# Patient Record
Sex: Male | Born: 1945
Health system: Southern US, Community
[De-identification: ages and names within clinical notes are randomized; demographics above are authoritative.]

## PROBLEM LIST (undated history)

## (undated) DIAGNOSIS — J40 Bronchitis, not specified as acute or chronic: Secondary | ICD-10-CM

## (undated) DIAGNOSIS — R0789 Other chest pain: Secondary | ICD-10-CM

## (undated) DIAGNOSIS — K635 Polyp of colon: Secondary | ICD-10-CM

## (undated) DIAGNOSIS — R911 Solitary pulmonary nodule: Secondary | ICD-10-CM

## (undated) DIAGNOSIS — E119 Type 2 diabetes mellitus without complications: Secondary | ICD-10-CM

## (undated) DIAGNOSIS — M1990 Unspecified osteoarthritis, unspecified site: Secondary | ICD-10-CM

## (undated) DIAGNOSIS — R7401 Elevation of levels of liver transaminase levels: Secondary | ICD-10-CM

## (undated) DIAGNOSIS — H9190 Unspecified hearing loss, unspecified ear: Secondary | ICD-10-CM

## (undated) DIAGNOSIS — R0609 Other forms of dyspnea: Secondary | ICD-10-CM

## (undated) DIAGNOSIS — T464X5A Adverse effect of angiotensin-converting-enzyme inhibitors, initial encounter: Secondary | ICD-10-CM

## (undated) DIAGNOSIS — M2669 Other specified disorders of temporomandibular joint: Secondary | ICD-10-CM

## (undated) DIAGNOSIS — I779 Disorder of arteries and arterioles, unspecified: Secondary | ICD-10-CM

## (undated) DIAGNOSIS — R058 Other specified cough: Secondary | ICD-10-CM

## (undated) DIAGNOSIS — F1490 Cocaine use, unspecified, uncomplicated: Secondary | ICD-10-CM

## (undated) DIAGNOSIS — R740 Nonspecific elevation of levels of transaminase and lactic acid dehydrogenase [LDH]: Secondary | ICD-10-CM

## (undated) DIAGNOSIS — R05 Cough: Secondary | ICD-10-CM

## (undated) DIAGNOSIS — I10 Essential (primary) hypertension: Secondary | ICD-10-CM

## (undated) DIAGNOSIS — E7800 Pure hypercholesterolemia, unspecified: Secondary | ICD-10-CM

## (undated) HISTORY — DX: Unspecified osteoarthritis, unspecified site: M19.90

## (undated) HISTORY — DX: Type 2 diabetes mellitus without complications: E11.9

## (undated) HISTORY — DX: Other forms of dyspnea: R06.09

## (undated) HISTORY — DX: Adverse effect of angiotensin-converting-enzyme inhibitors, initial encounter: T46.4X5A

## (undated) HISTORY — DX: Other specified cough: R05.8

## (undated) HISTORY — DX: Bronchitis, not specified as acute or chronic: J40

## (undated) HISTORY — DX: Polyp of colon: K63.5

## (undated) HISTORY — DX: Other specified disorders of temporomandibular joint: M26.69

## (undated) HISTORY — DX: Solitary pulmonary nodule: R91.1

## (undated) HISTORY — DX: Unspecified hearing loss, unspecified ear: H91.90

## (undated) HISTORY — DX: Other chest pain: R07.89

## (undated) HISTORY — DX: Disorder of arteries and arterioles, unspecified: I77.9

## (undated) HISTORY — DX: Cocaine use, unspecified, uncomplicated: F14.90

## (undated) HISTORY — DX: Essential (primary) hypertension: I10

## (undated) HISTORY — DX: Elevation of levels of liver transaminase levels: R74.01

## (undated) HISTORY — DX: Cough: R05

## (undated) HISTORY — DX: Pure hypercholesterolemia, unspecified: E78.00

## (undated) HISTORY — DX: Nonspecific elevation of levels of transaminase and lactic acid dehydrogenase (ldh): R74.0

---

## 1998-12-04 ENCOUNTER — Emergency Department (HOSPITAL_COMMUNITY): Admission: EM | Admit: 1998-12-04 | Discharge: 1998-12-04 | Payer: Self-pay | Admitting: Emergency Medicine

## 1999-11-19 ENCOUNTER — Emergency Department (HOSPITAL_COMMUNITY): Admission: EM | Admit: 1999-11-19 | Discharge: 1999-11-19 | Payer: Self-pay | Admitting: Internal Medicine

## 2002-02-16 ENCOUNTER — Emergency Department (HOSPITAL_COMMUNITY): Admission: EM | Admit: 2002-02-16 | Discharge: 2002-02-16 | Payer: Self-pay | Admitting: *Deleted

## 2002-02-16 ENCOUNTER — Encounter: Payer: Self-pay | Admitting: *Deleted

## 2002-09-16 DIAGNOSIS — I779 Disorder of arteries and arterioles, unspecified: Secondary | ICD-10-CM

## 2002-09-16 HISTORY — DX: Disorder of arteries and arterioles, unspecified: I77.9

## 2002-09-30 ENCOUNTER — Emergency Department (HOSPITAL_COMMUNITY): Admission: EM | Admit: 2002-09-30 | Discharge: 2002-09-30 | Payer: Self-pay | Admitting: Emergency Medicine

## 2002-09-30 ENCOUNTER — Encounter: Payer: Self-pay | Admitting: Emergency Medicine

## 2003-07-10 ENCOUNTER — Emergency Department (HOSPITAL_COMMUNITY): Admission: EM | Admit: 2003-07-10 | Discharge: 2003-07-11 | Payer: Self-pay | Admitting: Emergency Medicine

## 2004-06-24 ENCOUNTER — Ambulatory Visit: Payer: Self-pay | Admitting: Internal Medicine

## 2004-06-28 ENCOUNTER — Ambulatory Visit: Payer: Self-pay | Admitting: Internal Medicine

## 2004-07-28 ENCOUNTER — Ambulatory Visit: Payer: Self-pay | Admitting: Internal Medicine

## 2004-08-11 ENCOUNTER — Ambulatory Visit: Payer: Self-pay | Admitting: Internal Medicine

## 2004-08-26 ENCOUNTER — Ambulatory Visit: Payer: Self-pay | Admitting: Internal Medicine

## 2004-09-08 ENCOUNTER — Ambulatory Visit: Payer: Self-pay | Admitting: Internal Medicine

## 2004-10-14 ENCOUNTER — Ambulatory Visit (HOSPITAL_COMMUNITY): Admission: RE | Admit: 2004-10-14 | Discharge: 2004-10-14 | Payer: Self-pay | Admitting: Internal Medicine

## 2004-10-18 ENCOUNTER — Emergency Department (HOSPITAL_COMMUNITY): Admission: EM | Admit: 2004-10-18 | Discharge: 2004-10-18 | Payer: Self-pay | Admitting: Emergency Medicine

## 2004-12-10 ENCOUNTER — Ambulatory Visit: Payer: Self-pay | Admitting: Internal Medicine

## 2005-01-10 ENCOUNTER — Ambulatory Visit: Payer: Self-pay | Admitting: Internal Medicine

## 2005-03-02 ENCOUNTER — Ambulatory Visit: Payer: Self-pay | Admitting: Internal Medicine

## 2005-04-13 ENCOUNTER — Emergency Department (HOSPITAL_COMMUNITY): Admission: EM | Admit: 2005-04-13 | Discharge: 2005-04-13 | Payer: Self-pay | Admitting: Family Medicine

## 2005-05-13 ENCOUNTER — Ambulatory Visit: Payer: Self-pay | Admitting: Internal Medicine

## 2005-05-26 ENCOUNTER — Ambulatory Visit: Payer: Self-pay | Admitting: Internal Medicine

## 2005-09-26 ENCOUNTER — Ambulatory Visit: Payer: Self-pay | Admitting: Internal Medicine

## 2005-10-17 HISTORY — PX: COLONOSCOPY: SHX174

## 2005-10-27 ENCOUNTER — Ambulatory Visit: Payer: Self-pay | Admitting: Hospitalist

## 2005-11-01 ENCOUNTER — Ambulatory Visit: Payer: Self-pay | Admitting: Gastroenterology

## 2005-11-23 ENCOUNTER — Encounter (INDEPENDENT_AMBULATORY_CARE_PROVIDER_SITE_OTHER): Payer: Self-pay | Admitting: Specialist

## 2005-11-23 ENCOUNTER — Ambulatory Visit: Payer: Self-pay | Admitting: Gastroenterology

## 2005-11-23 ENCOUNTER — Encounter: Payer: Self-pay | Admitting: Internal Medicine

## 2005-11-29 ENCOUNTER — Emergency Department (HOSPITAL_COMMUNITY): Admission: EM | Admit: 2005-11-29 | Discharge: 2005-11-29 | Payer: Self-pay | Admitting: Family Medicine

## 2005-12-04 LAB — HM COLONOSCOPY: HM Colonoscopy: REACTIVE

## 2005-12-08 ENCOUNTER — Ambulatory Visit: Payer: Self-pay | Admitting: Hospitalist

## 2005-12-22 ENCOUNTER — Ambulatory Visit: Payer: Self-pay | Admitting: Hospitalist

## 2006-01-16 ENCOUNTER — Ambulatory Visit: Payer: Self-pay | Admitting: Hospitalist

## 2006-04-10 ENCOUNTER — Emergency Department (HOSPITAL_COMMUNITY): Admission: EM | Admit: 2006-04-10 | Discharge: 2006-04-10 | Payer: Self-pay | Admitting: Emergency Medicine

## 2006-09-29 ENCOUNTER — Ambulatory Visit: Payer: Self-pay | Admitting: Internal Medicine

## 2006-10-27 DIAGNOSIS — M26609 Unspecified temporomandibular joint disorder, unspecified side: Secondary | ICD-10-CM | POA: Insufficient documentation

## 2006-10-27 DIAGNOSIS — E785 Hyperlipidemia, unspecified: Secondary | ICD-10-CM | POA: Insufficient documentation

## 2006-12-25 ENCOUNTER — Telehealth: Payer: Self-pay | Admitting: *Deleted

## 2007-01-01 ENCOUNTER — Telehealth: Payer: Self-pay | Admitting: *Deleted

## 2007-08-06 ENCOUNTER — Telehealth (INDEPENDENT_AMBULATORY_CARE_PROVIDER_SITE_OTHER): Payer: Self-pay | Admitting: Hospitalist

## 2007-08-22 ENCOUNTER — Ambulatory Visit: Payer: Self-pay | Admitting: Hospitalist

## 2007-08-22 LAB — CONVERTED CEMR LAB: Microalb, Ur: 0.27 mg/dL (ref 0.00–1.89)

## 2007-08-24 ENCOUNTER — Telehealth: Payer: Self-pay | Admitting: *Deleted

## 2007-08-29 ENCOUNTER — Ambulatory Visit: Payer: Self-pay | Admitting: Hospitalist

## 2007-08-29 LAB — CONVERTED CEMR LAB
ALT: 73 units/L — ABNORMAL HIGH (ref 0–53)
AST: 61 units/L — ABNORMAL HIGH (ref 0–37)
Albumin: 4.1 g/dL (ref 3.5–5.2)
Alkaline Phosphatase: 58 units/L (ref 39–117)
CO2: 23 meq/L (ref 19–32)
Chloride: 101 meq/L (ref 96–112)
LDL Cholesterol: 132 mg/dL — ABNORMAL HIGH (ref 0–99)
Potassium: 3.5 meq/L (ref 3.5–5.3)
Sodium: 139 meq/L (ref 135–145)
Total CHOL/HDL Ratio: 6.1
VLDL: 25 mg/dL (ref 0–40)

## 2007-09-03 ENCOUNTER — Telehealth: Payer: Self-pay | Admitting: *Deleted

## 2007-09-17 ENCOUNTER — Ambulatory Visit: Payer: Self-pay | Admitting: Hospitalist

## 2007-09-17 LAB — CONVERTED CEMR LAB: Blood Glucose, Fingerstick: 170

## 2007-12-20 ENCOUNTER — Telehealth: Payer: Self-pay | Admitting: *Deleted

## 2008-01-07 ENCOUNTER — Ambulatory Visit: Payer: Self-pay | Admitting: Hospitalist

## 2008-01-07 LAB — CONVERTED CEMR LAB
ALT: 76 units/L — ABNORMAL HIGH (ref 0–53)
AST: 88 units/L — ABNORMAL HIGH (ref 0–37)
Albumin: 4.3 g/dL (ref 3.5–5.2)
Alkaline Phosphatase: 50 units/L (ref 39–117)
BUN: 12 mg/dL (ref 6–23)
Blood Glucose, AC Bkfst: 171 mg/dL
Calcium: 9.5 mg/dL (ref 8.4–10.5)
Cholesterol: 204 mg/dL — ABNORMAL HIGH (ref 0–200)
Creatinine, Ser: 1.01 mg/dL (ref 0.40–1.50)
LDL Cholesterol: 145 mg/dL — ABNORMAL HIGH (ref 0–99)
Potassium: 3.5 meq/L (ref 3.5–5.3)
Sodium: 139 meq/L (ref 135–145)
Total CHOL/HDL Ratio: 5.8
Triglycerides: 119 mg/dL (ref <150)

## 2008-04-02 ENCOUNTER — Ambulatory Visit: Payer: Self-pay | Admitting: *Deleted

## 2008-04-02 LAB — CONVERTED CEMR LAB
Blood Glucose, Fingerstick: 247
Hgb A1c MFr Bld: 9.8 %

## 2008-07-02 ENCOUNTER — Telehealth (INDEPENDENT_AMBULATORY_CARE_PROVIDER_SITE_OTHER): Payer: Self-pay | Admitting: *Deleted

## 2008-07-02 ENCOUNTER — Ambulatory Visit: Payer: Self-pay | Admitting: Internal Medicine

## 2008-07-02 ENCOUNTER — Ambulatory Visit (HOSPITAL_COMMUNITY): Admission: RE | Admit: 2008-07-02 | Discharge: 2008-07-02 | Payer: Self-pay | Admitting: Internal Medicine

## 2008-07-02 ENCOUNTER — Encounter (INDEPENDENT_AMBULATORY_CARE_PROVIDER_SITE_OTHER): Payer: Self-pay | Admitting: Internal Medicine

## 2008-07-02 DIAGNOSIS — E1159 Type 2 diabetes mellitus with other circulatory complications: Secondary | ICD-10-CM | POA: Insufficient documentation

## 2008-07-02 DIAGNOSIS — I152 Hypertension secondary to endocrine disorders: Secondary | ICD-10-CM | POA: Insufficient documentation

## 2008-07-02 DIAGNOSIS — I10 Essential (primary) hypertension: Secondary | ICD-10-CM

## 2008-07-02 LAB — CONVERTED CEMR LAB
Alkaline Phosphatase: 35 units/L — ABNORMAL LOW (ref 39–117)
CO2: 22 meq/L (ref 19–32)
Calcium: 9.8 mg/dL (ref 8.4–10.5)
Chloride: 104 meq/L (ref 96–112)
Creatinine, Urine: 343.1 mg/dL
Glucose, Bld: 202 mg/dL — ABNORMAL HIGH (ref 70–99)
Glucose, Urine, Semiquant: NEGATIVE
HCT: 45.3 % (ref 39.0–52.0)
Hemoglobin: 15 g/dL (ref 13.0–17.0)
MCHC: 33.1 g/dL (ref 30.0–36.0)
Microalb Creat Ratio: 6.8 mg/g (ref 0.0–30.0)
Nitrite: NEGATIVE
Platelets: 282 10*3/uL (ref 150–400)
RBC: 5.18 M/uL (ref 4.22–5.81)
RDW: 14.5 % (ref 11.5–15.5)
Sodium: 142 meq/L (ref 135–145)
pH: 7

## 2008-07-03 ENCOUNTER — Encounter (INDEPENDENT_AMBULATORY_CARE_PROVIDER_SITE_OTHER): Payer: Self-pay | Admitting: Internal Medicine

## 2008-07-03 ENCOUNTER — Ambulatory Visit: Payer: Self-pay | Admitting: Internal Medicine

## 2008-07-03 LAB — CONVERTED CEMR LAB
LDL Cholesterol: 154 mg/dL — ABNORMAL HIGH (ref 0–99)
Total CHOL/HDL Ratio: 5.5

## 2008-07-04 ENCOUNTER — Ambulatory Visit (HOSPITAL_COMMUNITY): Admission: RE | Admit: 2008-07-04 | Discharge: 2008-07-04 | Payer: Self-pay | Admitting: Internal Medicine

## 2008-07-15 ENCOUNTER — Ambulatory Visit: Payer: Self-pay | Admitting: Internal Medicine

## 2008-08-07 ENCOUNTER — Telehealth (INDEPENDENT_AMBULATORY_CARE_PROVIDER_SITE_OTHER): Payer: Self-pay | Admitting: Internal Medicine

## 2008-08-13 ENCOUNTER — Telehealth (INDEPENDENT_AMBULATORY_CARE_PROVIDER_SITE_OTHER): Payer: Self-pay | Admitting: Internal Medicine

## 2008-08-14 ENCOUNTER — Telehealth: Payer: Self-pay | Admitting: Internal Medicine

## 2008-10-02 ENCOUNTER — Telehealth: Payer: Self-pay | Admitting: Internal Medicine

## 2009-02-18 ENCOUNTER — Telehealth: Payer: Self-pay | Admitting: Internal Medicine

## 2009-02-19 ENCOUNTER — Telehealth (INDEPENDENT_AMBULATORY_CARE_PROVIDER_SITE_OTHER): Payer: Self-pay | Admitting: *Deleted

## 2009-02-19 ENCOUNTER — Ambulatory Visit: Payer: Self-pay | Admitting: Internal Medicine

## 2009-02-19 ENCOUNTER — Encounter: Payer: Self-pay | Admitting: Internal Medicine

## 2009-02-19 LAB — HM DIABETES EYE EXAM

## 2009-02-19 LAB — CONVERTED CEMR LAB: Hgb A1c MFr Bld: 6.9 %

## 2009-04-12 ENCOUNTER — Encounter: Payer: Self-pay | Admitting: Internal Medicine

## 2009-04-12 ENCOUNTER — Ambulatory Visit: Payer: Self-pay | Admitting: Infectious Diseases

## 2009-04-12 ENCOUNTER — Observation Stay (HOSPITAL_COMMUNITY): Admission: EM | Admit: 2009-04-12 | Discharge: 2009-04-13 | Payer: Self-pay | Admitting: Emergency Medicine

## 2009-04-13 ENCOUNTER — Encounter (INDEPENDENT_AMBULATORY_CARE_PROVIDER_SITE_OTHER): Payer: Self-pay | Admitting: Internal Medicine

## 2009-04-28 ENCOUNTER — Encounter: Payer: Self-pay | Admitting: Internal Medicine

## 2009-04-28 ENCOUNTER — Ambulatory Visit: Payer: Self-pay | Admitting: Internal Medicine

## 2009-04-28 LAB — CONVERTED CEMR LAB
Alkaline Phosphatase: 35 units/L — ABNORMAL LOW (ref 39–117)
BUN: 9 mg/dL (ref 6–23)
CO2: 25 meq/L (ref 19–32)
Calcium: 9.9 mg/dL (ref 8.4–10.5)
Chloride: 103 meq/L (ref 96–112)
Creatinine, Urine: 120.3 mg/dL
Eosinophils Relative: 2 % (ref 0–5)
Glucose, Bld: 99 mg/dL (ref 70–99)
HCT: 44.5 % (ref 39.0–52.0)
Hemoglobin: 14.8 g/dL (ref 13.0–17.0)
Hgb A1c MFr Bld: 7.3 %
LDL Cholesterol: 84 mg/dL (ref 0–99)
Lymphocytes Relative: 39 % (ref 12–46)
MCHC: 33.3 g/dL (ref 30.0–36.0)
MCV: 86.2 fL (ref 78.0–100.0)
Microalb, Ur: 0.73 mg/dL (ref 0.00–1.89)
Neutro Abs: 3.8 10*3/uL (ref 1.7–7.7)
Neutrophils Relative %: 51 % (ref 43–77)
Potassium: 4 meq/L (ref 3.5–5.3)
RDW: 14.1 % (ref 11.5–15.5)
Sodium: 140 meq/L (ref 135–145)
Total Bilirubin: 0.5 mg/dL (ref 0.3–1.2)
Triglycerides: 118 mg/dL (ref <150)
VLDL: 24 mg/dL (ref 0–40)

## 2009-06-03 ENCOUNTER — Ambulatory Visit: Payer: Self-pay | Admitting: Internal Medicine

## 2009-06-03 LAB — CONVERTED CEMR LAB
BUN: 13 mg/dL (ref 6–23)
Blood Glucose, Fingerstick: 89
Calcium: 10.1 mg/dL (ref 8.4–10.5)
Chloride: 106 meq/L (ref 96–112)
Glucose, Bld: 83 mg/dL (ref 70–99)
Potassium: 3.7 meq/L (ref 3.5–5.3)

## 2009-06-04 ENCOUNTER — Telehealth: Payer: Self-pay | Admitting: Internal Medicine

## 2009-06-19 ENCOUNTER — Ambulatory Visit: Payer: Self-pay | Admitting: Infectious Diseases

## 2009-07-03 ENCOUNTER — Telehealth: Payer: Self-pay | Admitting: Internal Medicine

## 2009-07-06 ENCOUNTER — Ambulatory Visit (HOSPITAL_COMMUNITY): Admission: RE | Admit: 2009-07-06 | Discharge: 2009-07-06 | Payer: Self-pay | Admitting: Internal Medicine

## 2009-10-14 ENCOUNTER — Telehealth (INDEPENDENT_AMBULATORY_CARE_PROVIDER_SITE_OTHER): Payer: Self-pay | Admitting: *Deleted

## 2009-11-06 ENCOUNTER — Ambulatory Visit: Payer: Self-pay | Admitting: Internal Medicine

## 2009-11-06 LAB — CONVERTED CEMR LAB
Blood Glucose, Fingerstick: 264
Hgb A1c MFr Bld: 8.6 %

## 2009-12-10 ENCOUNTER — Ambulatory Visit: Payer: Self-pay | Admitting: Internal Medicine

## 2009-12-24 ENCOUNTER — Emergency Department (HOSPITAL_COMMUNITY): Admission: EM | Admit: 2009-12-24 | Discharge: 2009-12-24 | Payer: Self-pay | Admitting: Emergency Medicine

## 2010-01-02 ENCOUNTER — Emergency Department (HOSPITAL_COMMUNITY): Admission: EM | Admit: 2010-01-02 | Discharge: 2010-01-02 | Payer: Self-pay | Admitting: Emergency Medicine

## 2010-01-07 ENCOUNTER — Ambulatory Visit: Payer: Self-pay | Admitting: Internal Medicine

## 2010-02-16 ENCOUNTER — Ambulatory Visit: Payer: Self-pay | Admitting: Internal Medicine

## 2010-02-16 LAB — CONVERTED CEMR LAB: Blood Glucose, Fingerstick: 99

## 2010-07-07 ENCOUNTER — Ambulatory Visit: Payer: Self-pay | Admitting: Internal Medicine

## 2010-07-07 LAB — CONVERTED CEMR LAB: Blood Glucose, Fingerstick: 149

## 2010-09-01 ENCOUNTER — Ambulatory Visit: Payer: Self-pay | Admitting: Internal Medicine

## 2010-09-01 ENCOUNTER — Telehealth: Payer: Self-pay | Admitting: *Deleted

## 2010-09-01 DIAGNOSIS — N201 Calculus of ureter: Secondary | ICD-10-CM

## 2010-09-01 LAB — CONVERTED CEMR LAB
Blood Glucose, Fingerstick: 156
Hgb A1c MFr Bld: 9 %

## 2010-09-02 ENCOUNTER — Ambulatory Visit (HOSPITAL_COMMUNITY): Admission: RE | Admit: 2010-09-02 | Discharge: 2010-09-02 | Payer: Self-pay | Admitting: Internal Medicine

## 2010-09-02 LAB — CONVERTED CEMR LAB
ALT: 69 U/L — ABNORMAL HIGH
AST: 82 U/L — ABNORMAL HIGH
Albumin: 4.1 g/dL
Alkaline Phosphatase: 47 U/L
BUN: 10 mg/dL
Bilirubin, Direct: 0.2 mg/dL
CO2: 28 meq/L
Calcium: 9.6 mg/dL
Chloride: 99 meq/L
Cholesterol: 145 mg/dL
Creatinine, Ser: 1.11 mg/dL
Creatinine, Urine: 236 mg/dL
Glucose, Bld: 169 mg/dL — ABNORMAL HIGH
HDL: 32 mg/dL — ABNORMAL LOW
Hemoglobin, Urine: NEGATIVE
Indirect Bilirubin: 0.2 mg/dL
LDL Cholesterol: 74 mg/dL
Microalb Creat Ratio: 5.6 mg/g
Microalb, Ur: 1.31 mg/dL
Nitrite: NEGATIVE
Potassium: 3.4 meq/L — ABNORMAL LOW
Protein, ur: NEGATIVE mg/dL
Sodium: 137 meq/L
Specific Gravity, Urine: 1.025
Total Bilirubin: 0.4 mg/dL
Total CHOL/HDL Ratio: 4.5
Total Protein: 7.3 g/dL
Triglycerides: 193 mg/dL — ABNORMAL HIGH
Urine Glucose: NEGATIVE mg/dL
Urobilinogen, UA: 2 — ABNORMAL HIGH
VLDL: 39 mg/dL
pH: 6

## 2010-10-01 ENCOUNTER — Telehealth: Payer: Self-pay | Admitting: Internal Medicine
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
## 2010-11-16 NOTE — Assessment & Plan Note (Signed)
Summary: DM TRAINING/VS   Allergies: No Known Drug Allergies   Complete Medication List: 1)  Aspir-low 81 Mg Tbec (Aspirin) .... Take 1 tablet by mouth once a day 2)  Glipizide 10 Mg Tabs (Glipizide) .... Take 1 tablet by mouth two times a day 3)  Pravachol 40 Mg Tabs (Pravastatin sodium) .... Take 1 tablet by mouth once a day 4)  Ultram 50 Mg Tabs (Tramadol hcl) .... Take 1 tablet by mouth four times a day 5)  Amlodipine Besylate 10 Mg Tabs (Amlodipine besylate) .... Take 1 tablet by mouth once a day 6)  Hydrochlorothiazide 25 Mg Tabs (Hydrochlorothiazide) .... Take 1 tablet by mouth once a day  Other Orders: DSMT(Medicare) Individual, 30 Minutes (E9937)  Diabetes Self Management Training  PCP: Yolonda Kida MD Diabetes Type: Type 2 non-insulin Other persons present: no Current smoking Status: never  Assessment Work Hours: Not currently working Sources of Support: reports wife and fmaily as supports- wife does his blood sugar testing for him- he doesn;t like needles/pain/wants to avoid insulin  Potential Barriers  Economic/Supplies  Needs review/assistance  Diabetes Medications:  Comments: says he has a meter and daughter purchases his testing supplies and medications for him    Monitoring Self monitoring blood glucose - a few times a week Name of Meter  ? patient did notknow  Recent Episodes of: Requiring Help from another person  Hyperglycemia : Yes Hypoglycemia: No Severe Hypoglycemia : No     Diabetes Disease Process  Discussed today Define diabetes in simple terms: No knowledge   State diabetes is treated by meal plan-exercise-medication-monitoring-education: Needs review/assistance Medications State name-action-dose-duration-side effects-and time to take medication: No knowledge    Nutritional Management Identify what foods most often affect blood glucose: No knowledge    Verbalize importance of controlling food portions: Needs review/assistance     State importance of spacing and not omitting meals and snacks: No knowledge    Monitoring State purpose and frequency of monitoring BG-ketones-HgbA1C  : Needs review/assistance   Perform glucose monitoring/ketone testing and record results correctly: Needs review/assistance    State target blood glucose and HgbA1C goals: Needs review/assistance    Complications State the causes-signs and symptoms and prevention of Hyperglycemia: No knowledge   Explain proper treatment of hyperglycemia: No knowledge   State the causes- signs and symptoms and prevention of hypoglycemia: No knowledge   Explain proper treatment of hypoglycemia: No knowledge   Glucose control and the development/prevention of long-term complications: Needs review/assistance    Exercise States importance of exercise: Needs review/assistance    Lifestyle changes:Goal setting and Problem solving State benefits of making appropriate lifestyle changes: Needs review/assistanceIdentify lifestyle behaviors that need to change: Needs review/assistanceIdentify risk factors that interfere with health: Needs review/assistanceDevelop strategies to reduce risk factors: Needs review/assistanceIdentify Family/SO role in managing diabetes: Demonstrates competency Psychosocial Adjustment State three common feelings that might be experienced when learning to cope with diabetes: Needs review/assistanceIdentify two methods to cope with these feelings: Needs review/assistanceIdentify how stress affects diabetes & two sources of stress: Needs review/assistanceName two ways of obtaining support from family/friends: Needs review/assistanceDiabetes Management Education Done: 12/10/2009    BEHAVIORAL GOALS INITIAL   Specific goal set today: no goals set at this time nas patient didnot demonstrate readiness- he was not feelign well today      gave patient informaiotn about Living with diabetes booklet nad the road map to success in managing  diabetes. Invited wife to future visits. Diabetes Self Management Support: family, clinic staff Follow-up:per  patient -being self pay is barrier- could refer to [facility] free ADA program
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
## 2010-11-16 NOTE — Assessment & Plan Note (Signed)
Summary: DM TEACHING/DS   Vital Signs:  Patient profile:   65 year old male Weight:      257 pounds BMI:     37.01 CBG Result 149 Comments this blood sugar is 2-2.5 hours after eating pigs feet, watermelon and water for lunch   Allergies: No Known Drug Allergies   Complete Medication List: 1)  Aspir-low 81 Mg Tbec (Aspirin) .... Take 1 tablet by mouth once a day 2)  Glipizide 10 Mg Tabs (Glipizide) .... Take 1 tablet by mouth two times a day 3)  Pravachol 40 Mg Tabs (Pravastatin sodium) .... Take 1 tablet by mouth once a day 4)  Amlodipine Besylate 10 Mg Tabs (Amlodipine besylate) .... Take 1 tablet by mouth once a day 5)  Hydrochlorothiazide 25 Mg Tabs (Hydrochlorothiazide) .... Take 1 tablet by mouth once a day 6)  Metformin Hcl 500 Mg Tabs (Metformin hcl) .... Take 2 tablets by mouth two times a day  Other Orders: T-Hgb A1C (in-house) [phone]) DSMT(Medicare) Individual, 30 Minutes [phone])  Laboratory Results   Blood Tests   Date/Time Received: July 07, 2010 2:48 PM Date/Time Reported: Maryan Rued  July 07, 2010 2:48 PM   HGBA1C: 7.8%   (Normal Range: Non-Diabetic - 3-6%   Control Diabetic - 6-8%) CBG Random:: 154m/dL     Diabetes Self Management Training  PCP: VYolonda KidaMD Date diagnosed with diabetes: 10/17/2005 Diabetes Type: Type 2 non-insulin Current smoking Status: never  Vital Signs Todays Weight: 257lb  in BMI 37.01in-lbs      Diabetes Medications:  Comments: Does not self monitor because of the pain and he doesn;t like the sticking. His wifes checks sometimes for him- last self monitor was 1 month ago. He did not know what his A1C was last check, nor how that related to his self monitoring at home      Estimated /Usual Carb Intake Breakfast # of Carbs/Grams cereal, eggs, banana, milk, coffee Lunch # of Carbs/Grams pigs feet, watermelon and water  Activity Limitations  Inadequate physical activity  Type of physical  activity  His focus today was on starting to walk again. he stops periodically for pain in feet legs. He wants to try getting new walking shoes Diabetes Disease Process  Discussed today Define diabetes in simple terms: Needs review/assistance Medications  Nutritional Management Identify what foods most often affect blood glucose: Needs review/assistance    Verbalize importance of controlling food portions: Needs review/assistance   State importance of spacing and not omitting meals and snacks: Demonstrates competencyState changes planned for home meals/snacks: Needs review/assistance    Monitoring State purpose and frequency of monitoring BG-ketones-HgbA1C  : Needs review/assistance   Perform glucose monitoring/ketone testing and record results correctly: Needs review/assistance    State target blood glucose and HgbA1C goals: Needs review/assistance    Complications State the causes-signs and symptoms and prevention of Hyperglycemia: Needs review/assistanceExplain proper treatment of hyperglycemia: Needs review/assistance   State the causes- signs and symptoms and prevention of hypoglycemia: Demonstrates competency   Explain proper treatment of hypoglycemia: Needs review/assistance    Exercise States importance of exercise: Needs review/assistance   States effect of exercise on blood glucose: Needs review/assistance   Verbalizes safety measures for exercise related to diabetes: Needs review/assistance    Lifestyle changes:Goal setting and Problem solving State benefits of making appropriate lifestyle changes: Needs review/assistance   Identify lifestyle behaviors that need to change: Needs review/assistance   Identify risk factors that interfere with health: Needs review/assistance   Develop strategies  to reduce risk factors: Needs review/assistance   Verbalize need for and frequency of health care follow-up: Needs review/assistanceDiabetes Management Education Done:  07/07/2010    BEHAVIORAL GOALS INITIAL Incorporating physical activity into lifestyle: start welking again after purchasing shoes Incorporating appropriate nutritional management: Limit carb portions at meals        Diabetes Self Management Support: family, clinic staff Follow-up: by phone in 3 weeks, TBD after that depending on patient needs

## 2010-11-16 NOTE — Progress Notes (Signed)
Summary: phone/gg  Phone Note Call from Patient   Caller: Patient Summary of Call: Pt called with pain to low back pain with radiation to leg.  Onset 6 days ago.  No know cause. Taking tylenol without relief.  will see today at 1500 Initial call taken by: Gevena Cotton RN,  September 01, 2010 10:26 AM
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
## 2010-11-18 NOTE — Progress Notes (Signed)
Summary: med refill/gp  Phone Note Refill Request Message from:  Fax from Pharmacy on October 01, 2010 11:26 AM  Refills Requested: Medication #1:  PRAVACHOL 40 MG TABS Take 1 tablet by mouth once a day. Hold this until you see your doctor   Last Refilled: 05/10/2010  Method Requested: Electronic Initial call taken by: Morrison Old RN,  October 01, 2010 11:26 AM  Follow-up for Phone Call       Follow-up by: Yolonda Kida MD,  October 01, 2010 1:55 PM    Prescriptions: PRAVACHOL 40 MG TABS (PRAVASTATIN SODIUM) Take 1 tablet by mouth once a day. Hold this until you see your doctor  #30 x 11   Entered and Authorized by:   Yolonda Kida MD   Signed by:   Yolonda Kida MD on 10/01/2010   Method used:   Electronically to        [facility] [phone]* (retail)       [address]       [place], [place]  [address]       Ph: [phone]       Fax: [phone]   RxID:   [id number]

## 2010-12-28 LAB — GLUCOSE, CAPILLARY: Glucose-Capillary: 156 mg/dL — ABNORMAL HIGH (ref 70–99)

## 2011-01-02 LAB — GLUCOSE, CAPILLARY: Glucose-Capillary: 264 mg/dL — ABNORMAL HIGH (ref 70–99)

## 2011-01-10 LAB — URINALYSIS, ROUTINE W REFLEX MICROSCOPIC
Glucose, UA: 250 mg/dL — AB
Ketones, ur: NEGATIVE mg/dL
Protein, ur: NEGATIVE mg/dL

## 2011-01-10 LAB — COMPREHENSIVE METABOLIC PANEL
ALT: 33 U/L (ref 0–53)
AST: 31 U/L (ref 0–37)
Albumin: 4.1 g/dL (ref 3.5–5.2)
Alkaline Phosphatase: 50 U/L (ref 39–117)
BUN: 17 mg/dL (ref 6–23)
Chloride: 102 mEq/L (ref 96–112)
GFR calc Af Amer: 45 mL/min — ABNORMAL LOW (ref >60)
Potassium: 3.4 mEq/L — ABNORMAL LOW (ref 3.5–5.1)
Sodium: 140 mEq/L (ref 135–145)
Total Bilirubin: 0.6 mg/dL (ref 0.3–1.2)
Total Protein: 7.8 g/dL (ref 6.0–8.3)

## 2011-01-10 LAB — DIFFERENTIAL
Basophils Absolute: 0 10*3/uL (ref 0.0–0.1)
Basophils Relative: 0 % (ref 0–1)
Eosinophils Absolute: 0.2 10*3/uL (ref 0.0–0.7)
Eosinophils Relative: 2 % (ref 0–5)
Monocytes Absolute: 1 10*3/uL (ref 0.1–1.0)
Monocytes Relative: 9 % (ref 3–12)

## 2011-01-10 LAB — CBC
HCT: 43.5 % (ref 39.0–52.0)
Platelets: 232 10*3/uL (ref 150–400)
RDW: 14.3 % (ref 11.5–15.5)
WBC: 11.4 10*3/uL — ABNORMAL HIGH (ref 4.0–10.5)

## 2011-01-10 LAB — URINE MICROSCOPIC-ADD ON

## 2011-01-22 LAB — GLUCOSE, CAPILLARY: Glucose-Capillary: 89 mg/dL (ref 70–99)

## 2011-01-24 LAB — GLUCOSE, CAPILLARY
Glucose-Capillary: 136 mg/dL — ABNORMAL HIGH (ref 70–99)
Glucose-Capillary: 157 mg/dL — ABNORMAL HIGH (ref 70–99)
Glucose-Capillary: 210 mg/dL — ABNORMAL HIGH (ref 70–99)

## 2011-01-24 LAB — BASIC METABOLIC PANEL
BUN: 9 mg/dL (ref 6–23)
Chloride: 105 mEq/L (ref 96–112)
GFR calc non Af Amer: >60 mL/min (ref >60)
Glucose, Bld: 86 mg/dL (ref 70–99)
Potassium: 3.5 mEq/L (ref 3.5–5.1)
Sodium: 139 mEq/L (ref 135–145)

## 2011-01-24 LAB — DIFFERENTIAL
Eosinophils Absolute: 0.3 10*3/uL (ref 0.0–0.7)
Eosinophils Relative: 3 % (ref 0–5)
Lymphocytes Relative: 37 % (ref 12–46)
Lymphs Abs: 3.3 10*3/uL (ref 0.7–4.0)
Monocytes Absolute: 0.7 10*3/uL (ref 0.1–1.0)

## 2011-01-24 LAB — CBC
HCT: 44.7 % (ref 39.0–52.0)
Hemoglobin: 14.7 g/dL (ref 13.0–17.0)
MCV: 88.9 fL (ref 78.0–100.0)
Platelets: 232 10*3/uL (ref 150–400)
RDW: 14.3 % (ref 11.5–15.5)

## 2011-01-24 LAB — URINE MICROSCOPIC-ADD ON

## 2011-01-24 LAB — POCT CARDIAC MARKERS: Troponin i, poc: <0.05 ng/mL (ref 0.00–0.09)

## 2011-01-24 LAB — CARDIAC PANEL(CRET KIN+CKTOT+MB+TROPI): Total CK: 250 U/L — ABNORMAL HIGH (ref 7–232)

## 2011-01-24 LAB — LIPID PANEL
Cholesterol: 149 mg/dL (ref 0–200)
HDL: 35 mg/dL — ABNORMAL LOW (ref >39)
LDL Cholesterol: 96 mg/dL (ref 0–99)
Triglycerides: 90 mg/dL (ref <150)

## 2011-01-24 LAB — URINALYSIS, ROUTINE W REFLEX MICROSCOPIC
Glucose, UA: NEGATIVE mg/dL
Nitrite: NEGATIVE
Specific Gravity, Urine: 1.017 (ref 1.005–1.030)
pH: 7.5 (ref 5.0–8.0)

## 2011-01-25 LAB — GLUCOSE, CAPILLARY: Glucose-Capillary: 182 mg/dL — ABNORMAL HIGH (ref 70–99)

## 2011-02-02 ENCOUNTER — Encounter: Payer: Self-pay | Admitting: Ophthalmology
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
## 2011-04-27 ENCOUNTER — Other Ambulatory Visit: Payer: Self-pay | Admitting: *Deleted

## 2011-04-27 MED ORDER — METFORMIN HCL 500 MG PO TABS: 1000.0000 mg | ORAL_TABLET | Freq: Two times a day (BID) | ORAL | Status: DC

## 2011-04-27 NOTE — Telephone Encounter (Signed)
Needs f/u appointment-has not been seen in almost a year. Will give one month refill only.

## 2011-04-27 NOTE — Telephone Encounter (Signed)
Message sent to front desk for an appt. 

## 2011-05-17 ENCOUNTER — Encounter: Payer: Self-pay | Admitting: Internal Medicine

## 2011-05-17 ENCOUNTER — Ambulatory Visit (INDEPENDENT_AMBULATORY_CARE_PROVIDER_SITE_OTHER): Payer: Self-pay | Admitting: Internal Medicine

## 2011-05-17 VITALS — BP 150/107 | HR 88 | Temp 97.5°F | Ht 70.0 in | Wt 244.4 lb

## 2011-05-17 DIAGNOSIS — E785 Hyperlipidemia, unspecified: Secondary | ICD-10-CM

## 2011-05-17 DIAGNOSIS — K635 Polyp of colon: Secondary | ICD-10-CM

## 2011-05-17 DIAGNOSIS — I10 Essential (primary) hypertension: Secondary | ICD-10-CM

## 2011-05-17 DIAGNOSIS — D126 Benign neoplasm of colon, unspecified: Secondary | ICD-10-CM

## 2011-05-17 DIAGNOSIS — E119 Type 2 diabetes mellitus without complications: Secondary | ICD-10-CM

## 2011-05-17 MED ORDER — INSULIN NPH ISOPHANE & REGULAR (70-30) 100 UNIT/ML ~~LOC~~ SUSP: SUBCUTANEOUS | Status: DC

## 2011-05-17 MED ORDER — RELION LANCETS MISC: Status: DC

## 2011-05-17 MED ORDER — AMLODIPINE BESYLATE 10 MG PO TABS: 10.0000 mg | ORAL_TABLET | Freq: Every day | ORAL | Status: DC

## 2011-05-17 MED ORDER — GLUCOSE BLOOD VI STRP: ORAL_STRIP | Status: DC

## 2011-05-17 MED ORDER — PRAVASTATIN SODIUM 40 MG PO TABS: 40.0000 mg | ORAL_TABLET | Freq: Every day | ORAL | Status: DC

## 2011-05-17 MED ORDER — ATENOLOL-CHLORTHALIDONE 100-25 MG PO TABS: ORAL_TABLET | ORAL | Status: DC

## 2011-05-17 MED ORDER — METFORMIN HCL 500 MG PO TABS: 1000.0000 mg | ORAL_TABLET | Freq: Two times a day (BID) | ORAL | Status: DC

## 2011-05-17 MED ORDER — "INSULIN SYRINGE-NEEDLE U-100 31G X 5/16"" 0.3 ML MISC": 1.0000 | Freq: Three times a day (TID) | Status: DC

## 2011-05-17 MED ORDER — BLOOD GLUCOSE METER KIT: PACK | Status: DC

## 2011-05-17 NOTE — Progress Notes (Signed)
History of present illness: [name] is a 65 year old man with past medical history hypertension, diabetes type 2, hyperlipidemia presents today for medication refill. Patient was last seen in November of 2011 and had a hemoglobin A1c of 9.0. Today he reports feeling well and has no complaints. He denies any numbness or tingling, blurred vision, polydipsia or polyuria , ulcers, nausea or vomiting, constipation or abdominal pain. He states that he will have an appointment with his ophthalmologist next month and also he knee a followup colonoscopy every 10 years because they found polyps on colonoscopy in 2007 which shows hyperplastic polyp with underlying reactive lymphoid aggregate, which was performed by Dr. Ardis Hughs.  He reports medication compliance.  Review of systems: As per history of present illness  Physical examination: General: alert, well-developed, and cooperative to examination.   Lungs: normal respiratory effort, no accessory muscle use, normal breath sounds, no crackles, and no wheezes. Heart: normal rate, regular rhythm, no murmur, no gallop, and no rub.  Abdomen: soft, non-tender, normal bowel sounds, no distention, no guarding, no rebound tenderness Msk: no joint swelling, no joint warmth, and no redness over joints.  Pulses: 2+ DP/PT pulses bilaterally Extremities: No cyanosis, clubbing, edema, tiny callus on right great toe on medial aspect without any erythema, tenderness or drainage.  Neurologic: alert & oriented X3, cranial nerves II-XII intact, strength normal in all extremities, sensation intact to light touch, and gait normal.  Psych: Oriented X3, memory intact for recent and remote, normally interactive, good eye contact, not anxious appearing, and not depressed appearing.

## 2011-05-17 NOTE — Assessment & Plan Note (Addendum)
Poorly controlled. His hemoglobin A1c has continued to trend up because his hemoglobin A1c was 9.0 in November 2011, repeat hemoglobin A1c today was 11.3. Even though he does report medication compliance, I think the oral medication is not effective enough to control his diabetes. As such patient was very hesitant to start insulin because he does not want to give himself shots however after a detailed discussion about the importance of controlling his hemoglobin A1c with a goal between 6 and 7 so that we can prevent future complications/ sequela of diabetes. I also explained to patient that with his hemoglobin A1c of 11.3, it is necessary that he start insulin for better control.  Patient voiced understanding and he states that he will try for a short period of time since he does not want to be on insulin for the rest of his life. I also explained to patient that once his hemoglobin A1c is within target goal, his PCP may titrate him off of insulin and continue just on oral medication.  Patient does not have insurance or orange car therefore he will be paying cash for his medication. -Will start Novolin 70/30, based on his weight of 110 kg and 0.5 unit per kg, he will need a total of 55 units. Patient was instructed to take 20 units in the morning 30 minutes before breakfast and then 35 units in the evening 30 minutes before dinner. -I will continue metformin 1000 mg by mouth twice a day -Will stop glipizide -I prescribed Relion glucometer, lancets, test strips and syringes today. -Will check a CMP today -Will check a urine microalbumin/creatinine ratio (even though his ratio was 5.6 a month ago) since his hemoglobin A1c is elevated to see if he needs to be on an ARB since patient cannot tolerate ACE inhibitor because it gives him a cough -Will repeat hemoglobin A1c in 3 months - I will have patient followup in 2 weeks with his PCP to evaluate his CBGs and adjust insulin dosage accordingly. - I asked patient  to check his blood sugars at least 2-3 times daily

## 2011-05-17 NOTE — Assessment & Plan Note (Signed)
Well controlled. Lipid panel in November 2011 showing LDL of 74. Will continue pravastatin 40 mg by mouth each bedtime.  I will not check a lipid panel today.

## 2011-05-17 NOTE — Assessment & Plan Note (Addendum)
Not well controlled. Blood pressure today was 144/103 which is not at target for a diabetic (<130/80).  Repeat blood pressure was 150/107.  Patient is unable to tolerate ACE inhibitor because of a cough.   -Will d/c HCTZ -Start atenolol/chlorthalidone 100/68m , take 1/2 tablet daily ([facility] only has Tenoretic 100/259mon their $4 list) -Continue Norvasc 1031mo qd -Recheck BP in 2 weeks

## 2011-05-17 NOTE — Patient Instructions (Signed)
Please inject 20 units in the morning and 35 units at night (30 minutes before meals) Continue taking Metformin 1056m twice daily Start taking Tenoretic (atenolol/chlorthalidone) 100/251m1/2 tablet daily Continue taking Norvasc (amlodipine 1052mone tablet daily Continue taking Pravastatin 65m57me tablet daily  Stop taking Glipizide Stop taking hydrochlorothiazide  Check your sugar 2-3 times daily and bring your meter to next office visit Continue to eat properly and exercise!! Follow up in 2 weeks with Dr. GargCathren Laine blood pressure check and evaluation of your blood sugars

## 2011-05-18 LAB — COMPLETE METABOLIC PANEL WITH GFR
AST: 71 U/L — ABNORMAL HIGH (ref 0–37)
Albumin: 4.5 g/dL (ref 3.5–5.2)
Alkaline Phosphatase: 53 U/L (ref 39–117)
Glucose, Bld: 309 mg/dL — ABNORMAL HIGH (ref 70–99)
Potassium: 3.7 mEq/L (ref 3.5–5.3)
Sodium: 136 mEq/L (ref 135–145)
Total Protein: 7.9 g/dL (ref 6.0–8.3)

## 2011-06-01 ENCOUNTER — Ambulatory Visit (INDEPENDENT_AMBULATORY_CARE_PROVIDER_SITE_OTHER): Payer: Self-pay | Admitting: Internal Medicine

## 2011-06-01 ENCOUNTER — Encounter: Payer: Self-pay | Admitting: Internal Medicine

## 2011-06-01 DIAGNOSIS — E119 Type 2 diabetes mellitus without complications: Secondary | ICD-10-CM

## 2011-06-01 DIAGNOSIS — E785 Hyperlipidemia, unspecified: Secondary | ICD-10-CM

## 2011-06-01 DIAGNOSIS — I10 Essential (primary) hypertension: Secondary | ICD-10-CM

## 2011-06-01 LAB — LIPID PANEL
HDL: 31 mg/dL — ABNORMAL LOW (ref >39)
LDL Cholesterol: 130 mg/dL — ABNORMAL HIGH (ref 0–99)

## 2011-06-01 LAB — HEPATIC FUNCTION PANEL
ALT: 44 U/L (ref 0–53)
AST: 55 U/L — ABNORMAL HIGH (ref 0–37)
Albumin: 4.2 g/dL (ref 3.5–5.2)
Alkaline Phosphatase: 38 U/L — ABNORMAL LOW (ref 39–117)
Bilirubin, Direct: 0.1 mg/dL (ref 0.0–0.3)
Indirect Bilirubin: 0.5 mg/dL (ref 0.0–0.9)
Total Bilirubin: 0.6 mg/dL (ref 0.3–1.2)
Total Protein: 7 g/dL (ref 6.0–8.3)

## 2011-06-01 NOTE — Assessment & Plan Note (Signed)
We will check fasting lipid panel today and we'll readjust medication regimen if indicated. We will also check liver function tests.

## 2011-06-01 NOTE — Assessment & Plan Note (Signed)
Uncontrolled diabetes. Patient is started on insulin. He is tolerating treatment well. Also reports compliance with medications and recommended diet and exercise. He brought in meter which we will download and review. For now we'll continue same regimen and will make no changes. Patient scheduled to come back in 3 months for further evaluation on diabetes control.

## 2011-06-01 NOTE — Assessment & Plan Note (Signed)
Blood pressure well controlled current medication regimen. We'll check liver function test today, electrolyte panel was checked recently and was within normal limits. We'll continue same medication regimen with no changes. Patient was advised to check blood pressure regularly and to call as directed the numbers are higher than 140/90.

## 2011-06-01 NOTE — Progress Notes (Signed)
  Subjective:    Patient ID: John Hobbs, male    DOB: 29-Aug-1946, 65 y.o.   MRN: [id number]  HPI  Patient is a 65 year old male with past medical history outlined below who presents to clinic for regular followup of diabetes, hypertension, cholesterol. He has been seen in clinic 2 weeks ago and A1c was checked at the time of the appointment and was over 11. Patient was started on insulin at that time. He brought in his meter for review and further evaluation. She reports compliance with current medications as well as recommended diet and exercise. He denies recent significant hospitalizations or sicknesses, no episodes of chest pain or shortness of breath, no abdominal or urinary concerns. Patient reports walking daily hold for one to 2 miles and avoiding fast food.  Review of Systems Constitutional: Denies fever, chills, diaphoresis, appetite change and fatigue.  HEENT: Denies photophobia, eye pain, redness, hearing loss, ear pain, congestion, sore throat, rhinorrhea, sneezing, mouth sores, trouble swallowing, neck pain, neck stiffness and tinnitus.   Respiratory: Denies SOB, DOE, cough, chest tightness,  and wheezing.   Cardiovascular: Denies chest pain, palpitations and leg swelling.  Gastrointestinal: Denies nausea, vomiting, abdominal pain, diarrhea, constipation, blood in stool and abdominal distention.  Genitourinary: Denies dysuria, urgency, frequency, hematuria, flank pain and difficulty urinating.  Musculoskeletal: Denies myalgias, back pain, joint swelling, arthralgias and gait problem.  Skin: Denies pallor, rash and wound.  Neurological: Denies dizziness, seizures, syncope, weakness, light-headedness, numbness and headaches.  Hematological: Denies adenopathy. Easy bruising, personal or family bleeding history  Psychiatric/Behavioral: Denies suicidal ideation, mood changes, confusion, nervousness, sleep disturbance and agitation      Objective:   Physical Exam Constitutional:  Vital signs reviewed.  Patient is a well-developed and well-nourished in no acute distress and cooperative with exam. Alert and oriented x3.  Cardiovascular: RRR, S1 normal, S2 normal, no MRG, pulses symmetric and intact bilaterally Pulmonary/Chest: CTAB, no wheezes, rales, or rhonchi Abdominal: Soft. Non-tender, non-distended, bowel sounds are normal, no masses, organomegaly, or guarding present.  Neurological: A&O x3, Strenght is normal and symmetric bilaterally, cranial nerve II-XII are grossly intact, no focal motor deficit, sensory intact to light touch bilaterally.  Skin: Warm, dry and intact. No rash, cyanosis, or clubbing.  Psychiatric: Normal mood and affect. speech and behavior is normal. Judgment and thought content normal. Cognition and memory are normal.          Assessment & Plan:

## 2011-08-29 ENCOUNTER — Ambulatory Visit (INDEPENDENT_AMBULATORY_CARE_PROVIDER_SITE_OTHER): Payer: Self-pay | Admitting: Internal Medicine

## 2011-08-29 VITALS — BP 146/95 | HR 71 | Temp 97.4°F | Wt 237.7 lb

## 2011-08-29 DIAGNOSIS — Z23 Encounter for immunization: Secondary | ICD-10-CM

## 2011-08-29 DIAGNOSIS — E785 Hyperlipidemia, unspecified: Secondary | ICD-10-CM

## 2011-08-29 DIAGNOSIS — I10 Essential (primary) hypertension: Secondary | ICD-10-CM

## 2011-08-29 DIAGNOSIS — E119 Type 2 diabetes mellitus without complications: Secondary | ICD-10-CM

## 2011-08-29 LAB — POCT GLYCOSYLATED HEMOGLOBIN (HGB A1C): Hemoglobin A1C: 7.4

## 2011-08-29 MED ORDER — ATENOLOL-CHLORTHALIDONE 100-25 MG PO TABS: ORAL_TABLET | ORAL | Status: DC

## 2011-08-29 MED ORDER — PRAVASTATIN SODIUM 20 MG PO TABS: 20.0000 mg | ORAL_TABLET | Freq: Every evening | ORAL | Status: DC

## 2011-08-29 MED ORDER — METFORMIN HCL 1000 MG PO TABS: 1000.0000 mg | ORAL_TABLET | Freq: Two times a day (BID) | ORAL | Status: DC

## 2011-08-29 NOTE — Assessment & Plan Note (Signed)
I would like to manage it without adding any BP meds at this time. Patient is motivated to loose weight and does not want another med to be added to his regimen. Follow up in 3 months.

## 2011-08-29 NOTE — Progress Notes (Signed)
  Subjective:    Patient ID: John Hobbs, male    DOB: [birth year], 65 y.o.   MRN: [id number]  HPI  John Hobbs is a 65 year old man with PMH as mentioned in the chart.  He is here today for a regular follow up.  His Hba1c is 7.4 today which is much improved as compared to last time (11.3). Patient was started on insulin the last time. He has been very compliant with all his meds. He does not complain about episodes of hypoglycemia.  He is asking me about ways to get rid of insulin.  His BP is 146/95 which is mildly elevated.   Patient is determined to loose weight and wants to get his BP and diabetes under better control.  Due for pneumovax and flushot.    Review of Systems  Constitutional: Negative for fever, activity change and appetite change.  HENT: Negative for sore throat.   Respiratory: Negative for cough and shortness of breath.   Cardiovascular: Negative for chest pain and leg swelling.  Gastrointestinal: Negative for nausea, abdominal pain, diarrhea, constipation and abdominal distention.  Genitourinary: Negative for frequency, hematuria and difficulty urinating.  Neurological: Negative for dizziness and headaches.  Psychiatric/Behavioral: Negative for suicidal ideas and behavioral problems.       Objective:   Physical Exam  Constitutional: He is oriented to person, place, and time. He appears well-developed and well-nourished.  HENT:  Head: Normocephalic and atraumatic.  Eyes: Conjunctivae and EOM are normal. Pupils are equal, round, and reactive to light. No scleral icterus.  Neck: Normal range of motion. Neck supple. No JVD present. No thyromegaly present.  Cardiovascular: Normal rate, regular rhythm, normal heart sounds and intact distal pulses.  Exam reveals no gallop and no friction rub.   No murmur heard. Pulmonary/Chest: Effort normal and breath sounds normal. No respiratory distress. He has no wheezes. He has no rales.  Abdominal: Soft. Bowel sounds are  normal. He exhibits no distension and no mass. There is no tenderness. There is no rebound and no guarding.  Musculoskeletal: Normal range of motion. He exhibits no edema and no tenderness.  Lymphadenopathy:    He has no cervical adenopathy.  Neurological: He is alert and oriented to person, place, and time.  Psychiatric: He has a normal mood and affect. His behavior is normal.          Assessment & Plan:

## 2011-08-29 NOTE — Assessment & Plan Note (Signed)
I would change the dose of insulin to 20 AM and 25 PM. Eye exam next week. No other changes today. 1800 calorie diet suggested.

## 2011-08-29 NOTE — Assessment & Plan Note (Signed)
Patient had not been on Pravachol as his LFT's were elevated the last office visit.  The recommendation to stop statin with elevated LFT's is when they rise >twice the upper limit of normal. I will restart pravachol at this time at a lower dose and will follow up LFT's in 3 months.

## 2011-08-29 NOTE — Patient Instructions (Signed)
1800 Calorie Diabetic Diet The 1800 calorie diabetic diet is designed for eating up to 1800 calories each day. Following this diet and making healthy meal choices can help improve overall health. It controls blood glucose (sugar) levels, and it can also help lower blood pressure and cholesterol. SERVING SIZES Measuring foods and serving sizes helps to make sure you are getting the right amount of food. The list below tells how big or small some common serving sizes are:  1 oz.........4 stacked dice.   3 oz........Marland KitchenDeck of cards.   1 tsp.......Marland KitchenTip of little finger.   1 tbs......Marland KitchenMarland KitchenThumb.   2 tbs.......Marland KitchenGolf ball.    cup......Marland KitchenHalf of a fist.   1 cup.......Marland KitchenA fist.  GUIDELINES FOR CHOOSING FOODS The goal of this diet is to eat a variety of foods and limit calories to 1800 each day. This can be done by choosing foods that are low in calories and fat. The diet also suggests eating small amounts of food frequently. Doing this helps control your blood glucose levels so they do not get too high or too low. Each meal or snack may include a protein food source to help you feel more satisfied. Try to eat about the same amount of food around the same time each day. This includes weekend days, travel days, and days off work. Space your meals about 4 to 5 hours apart, and add a snack between them, if you wish.  For example, a daily food plan could include breakfast, a morning snack, lunch, dinner, and an evening snack. Healthy meals and snacks have different types of foods, including whole grains, vegetables, fruits, lean meats, poultry, fish, and dairy products. As you plan your meals, select a variety of foods. Choose from the bread and starch, vegetable, fruit, dairy, and meat/protein groups. Examples of foods from each group are listed below with their suggested serving sizes. Use measuring cups and spoons to become familiar with what a healthy portion looks like. Bread and Starch Each serving equals  15 grams of carbohydrates.  1 slice bread.    bagel.    cup cold cereal (unsweetened).    cup hot cereal or mashed potatoes.   1 small potato (size of a computer mouse).   ? cup cooked pasta or rice.    English muffin.   1 cup broth-based soup.   3 cups of popcorn.   4 to 6 whole-wheat crackers.    cup cooked beans, peas, or corn.  Vegetables Each serving equals 5 grams of carbohydrates.   cup cooked vegetables.   1 cup raw vegetables.    cup tomato or vegetable juice.  Fruit Each serving equals 15 grams of carbohydrates.  1 small apple or orange.   1  cup watermelon or strawberries.    cup applesauce (no sugar added).   2 tbs raisins.    banana.    cup canned fruit, packed in water or in its own juice.    cup unsweetened fruit juice.  Dairy Each serving equals 12 to 15 grams of carbohydrates.  1 cup fat-free milk.   6 oz artificially sweetened yogurt or plain yogurt.   1 cup low-fat buttermilk.   1 cup soy milk.   1 cup almond milk.  Meat/Protein  1 large egg.   2 to 3 oz meat, poultry, or fish.    cup low-fat cottage cheese.   1 tbs peanut butter.   1 oz low-fat cheese.    cup tuna, packed in water.  cup tofu.  Fat  1 tsp oil.   1 tsp trans-fat-free margarine.   1 tsp butter.   1 tsp mayonnaise.   2 tbs avocado.   1 tbs salad dressing.   1 tbs cream cheese.   2 tbs sour cream.  SAMPLE 1800 CALORIE DIET PLAN Breakfast   cup unsweetened cereal (1 carb serving).   1 cup fat-free milk (1 carb serving).   1 slice whole-wheat toast (1 carb serving).    small banana (1 carb serving).   1 scrambled egg.   1 tsp trans-fat-free margarine.  Lunch  Tuna sandwich.   2 slices whole-wheat bread (2 carb servings).    cup canned tuna in water, drained.   1 tbs reduced fat mayonnaise.   1 stalk celery, chopped.   2 slices tomato.   1 lettuce leaf.   1 cup carrot sticks.   24 to 30 seedless  grapes (2 carb servings).   6 oz light yogurt (1 carb serving).  Afternoon Snack  3 graham cracker squares (1 carb serving).   1 cup fat-free milk (1 carb serving).   1 tbs peanut butter.  Dinner  3 oz salmon, broiled with 1 tsp oil.   1 cup mashed potatoes (2 carb servings) with 1 tsp trans-fat-free margarine.   1 cup fresh or frozen green beans.   1 cup steamed asparagus.   1 cup fat-free milk (1 carb serving).  Evening Snack  3 cups of air-popped popcorn (1 carb serving).   2 tbs Parmesan cheese.  Meal Plan You can use this worksheet to help you make a daily meal plan based on the 1800 calorie diabetic diet suggestions. If you are using this plan to help you control your blood glucose, you may interchange carbohydrate-containing foods (dairy, starches, and fruits). Select a variety of fresh foods of varying colors and flavors. The total amount of carbohydrate in your meals or snacks is more important than making sure you include all of the food groups every time you eat. Choose from the approximate amount of the following foods to build your day's meals:  8 Starches.   4 Vegetables.   3 Fruits.   2 Dairy.   6 to 7 oz Meat/Protein.   Up to 4 Fats.  Your dietician can use this worksheet to help you decide how many servings and which types of foods are right for you. BREAKFAST Food Group and Servings / Food Choice Starches _______________________________________________________ Dairy __________________________________________________________ Fruit ___________________________________________________________ Meat/Protein ____________________________________________________ Fat ____________________________________________________________ LUNCH Food Group and Servings / Food Choice Starch _________________________________________________________ Meat/Protein ___________________________________________________ Vegetables  _____________________________________________________ Fruit __________________________________________________________ Dairy __________________________________________________________ Fat ____________________________________________________________ John Hobbs Food Group and Servings / Food Choice Starch ________________________________________________________ Meat/Protein ___________________________________________________ Fruit __________________________________________________________ Dairy __________________________________________________________ John Hobbs Food Group and Servings / Food Choice Starches _______________________________________________________ Meat/Protein ___________________________________________________ Dairy __________________________________________________________ Vegetable ______________________________________________________ Fruit ___________________________________________________________ Fat ____________________________________________________________ John Hobbs Food Group and Servings / Food Choice Fruit __________________________________________________________ Meat/Protein ___________________________________________________ Dairy __________________________________________________________ Starch _________________________________________________________ DAILY TOTALS Starches _________________________ Vegetables _______________________ Fruits ____________________________ Dairy ____________________________ Meat/Protein_____________________ Fats _____________________________ Document Released: 04/25/2005 Document Revised: 06/15/2011 Document Reviewed: 08/20/2009 [employer] Patient Information 2012 [place], [place].

## 2011-11-12 IMAGING — CT CT ABD-PELV W/ CM
2 of 5 series · 17 of 46 positions shown, 19 images · IV contrast (APPLIED)
Comparison: 07/04/2008

CLINICAL DATA: 63-year-old with abdominal pain.

CT ABDOMEN AND PELVIS WITH CONTRAST
TECHNIQUE: Multidetector CT imaging of the abdomen and pelvis was
performed following the standard protocol during bolus
administration of intravenous contrast.
Contrast: 80 ml Emnipaque-EUU

[Series 2: abd_pel 5.0 b40f st · axial · 0.85mm/px · z∈[-519,-84]mm · 14 of 99 slices shown, 16 images]
[im 6/99  soft-tissue]
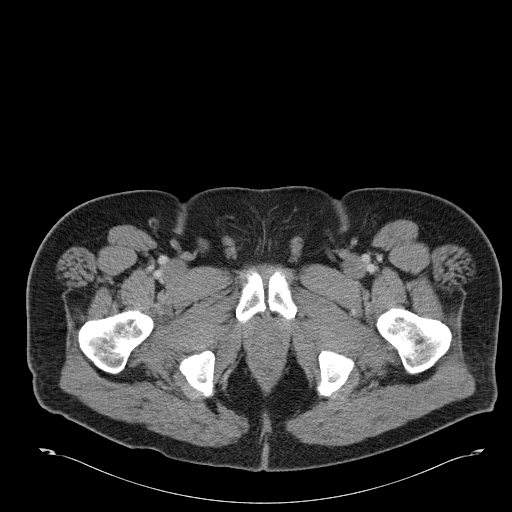
[im 6/99  bone]
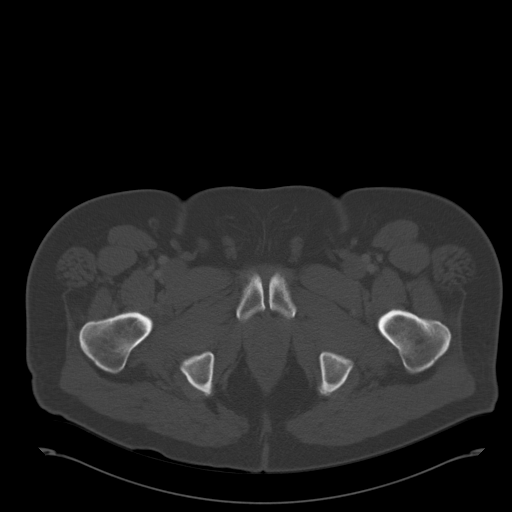
[im 11/99  soft-tissue]
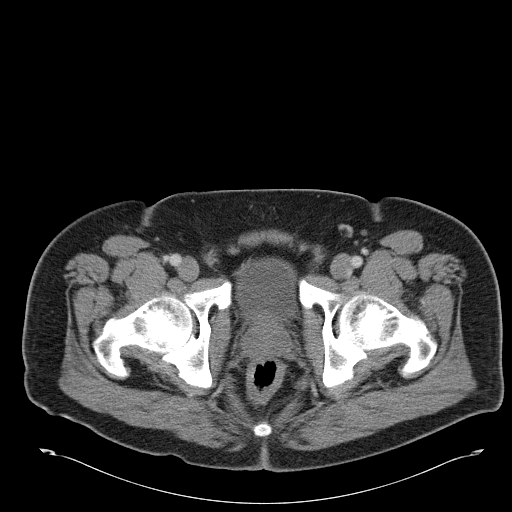
[im 21/99  soft-tissue]
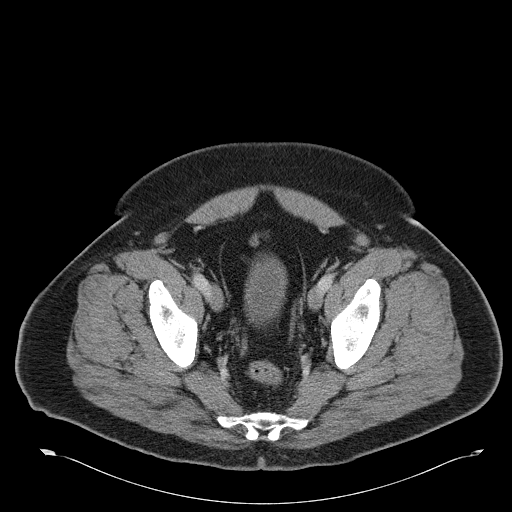
[im 26/99  soft-tissue]
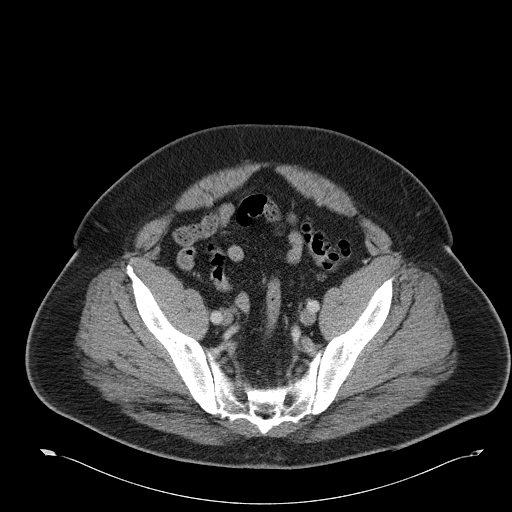
[im 31/99  soft-tissue]
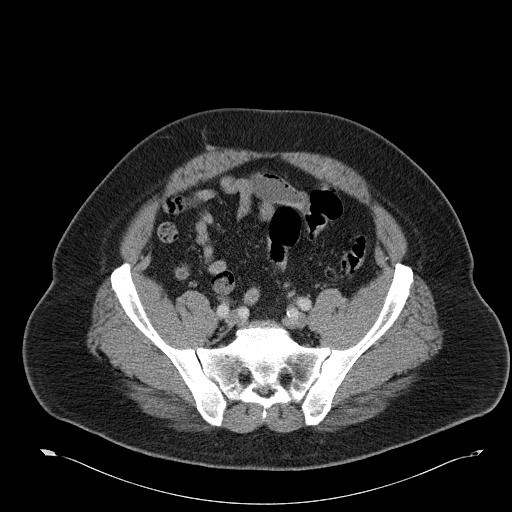
[im 42/99  soft-tissue]
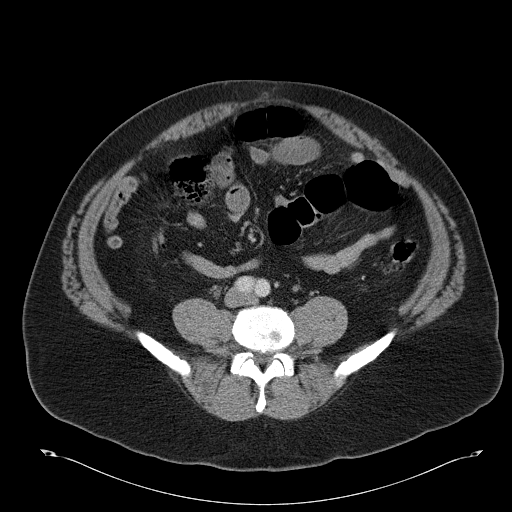
[im 47/99  soft-tissue]
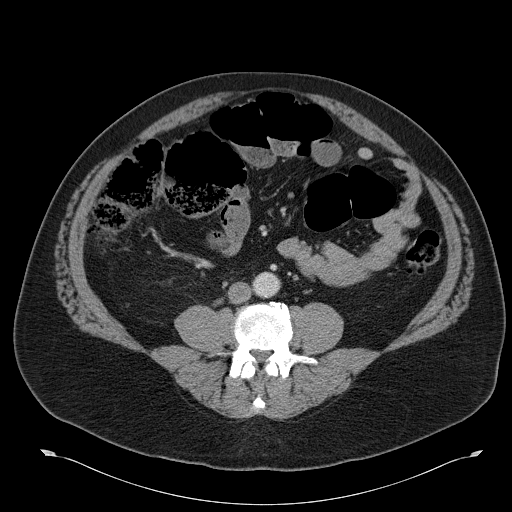
[im 52/99  soft-tissue]
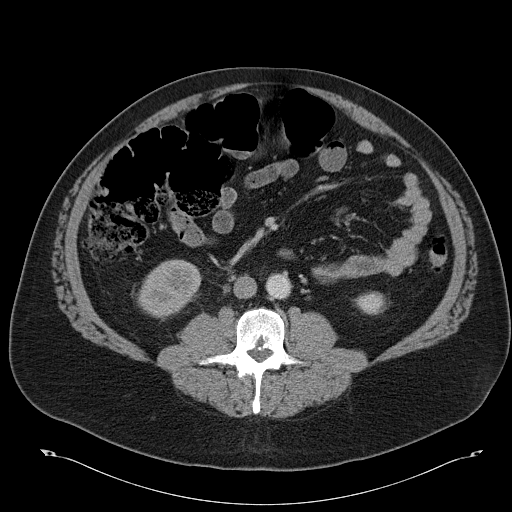
[im 57/99  soft-tissue]
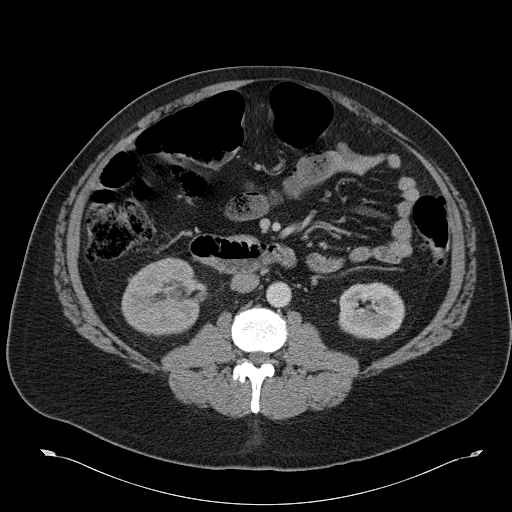
[im 57/99  bone]
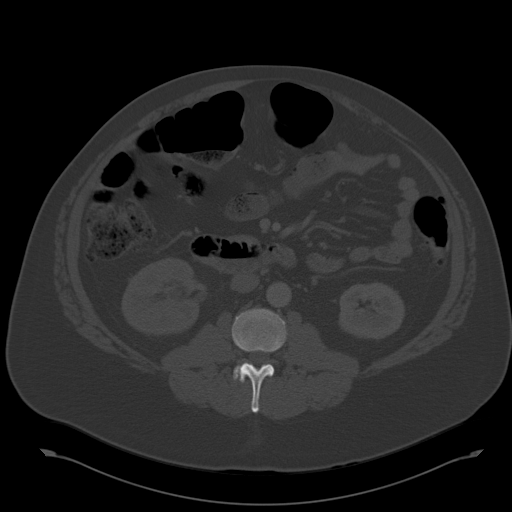
[im 68/99  soft-tissue]
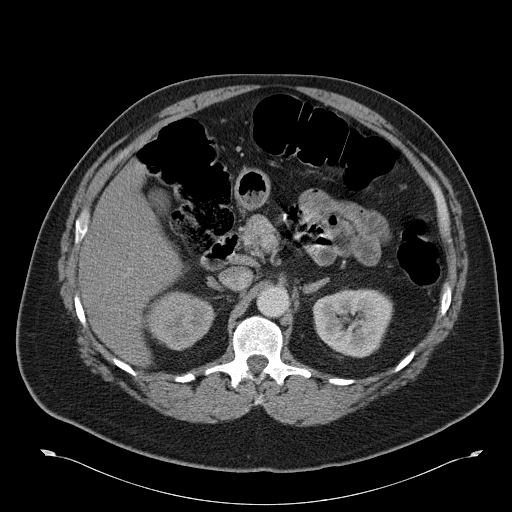
[im 73/99  soft-tissue]
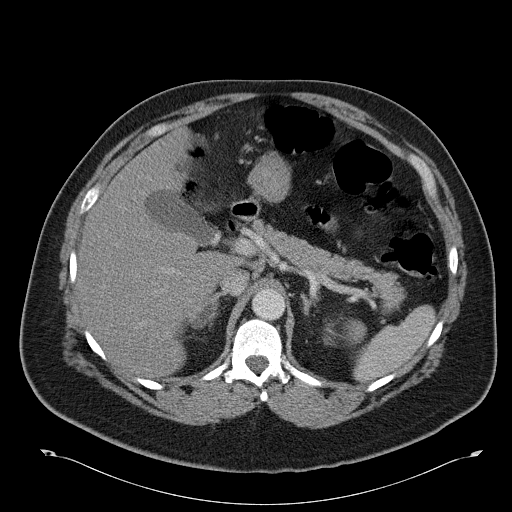
[im 78/99  soft-tissue]
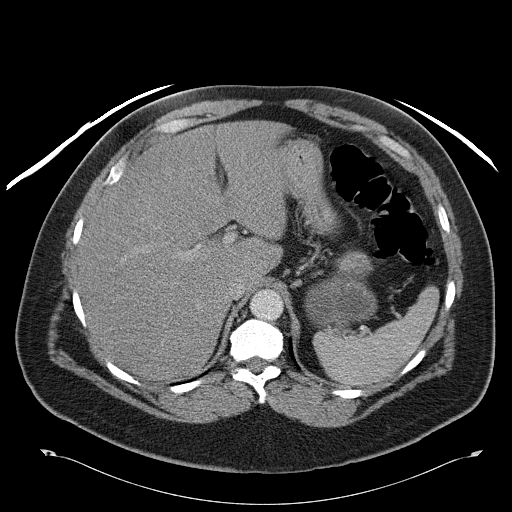
[im 88/99  soft-tissue]
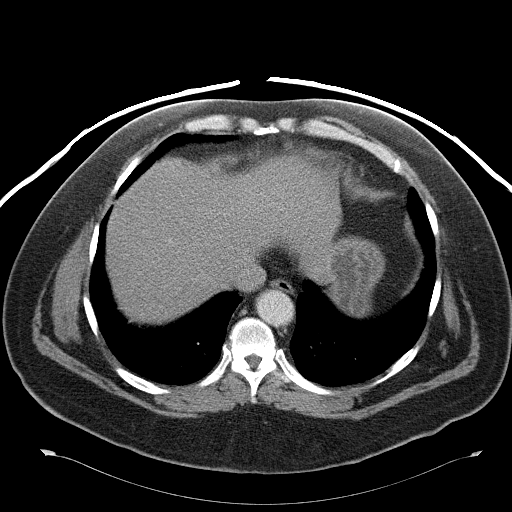
[im 93/99  soft-tissue]
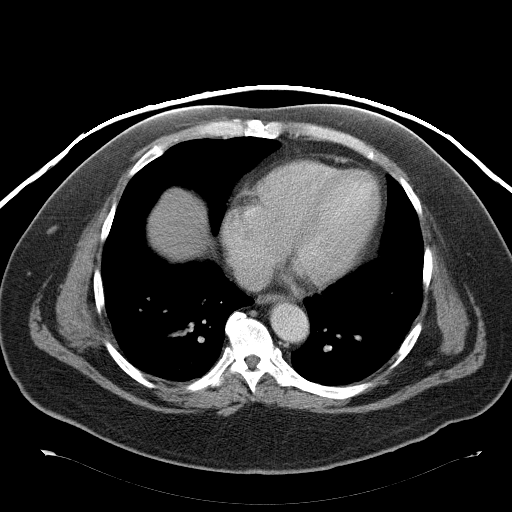

[Series 602: coronal abdomen · coronal · 1.00mm/px · 3 of 161 slices shown]
[im 54/161  soft-tissue]
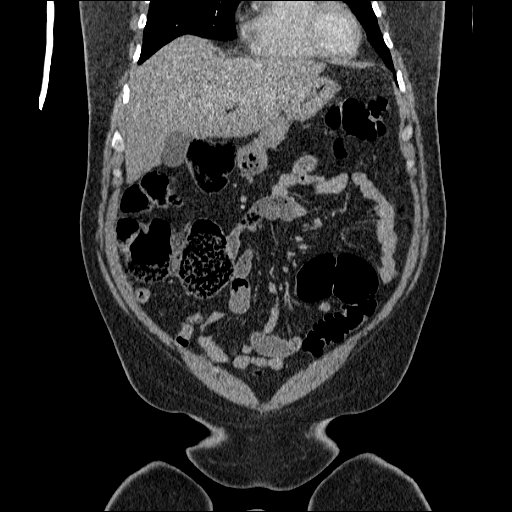
[im 72/161  soft-tissue]
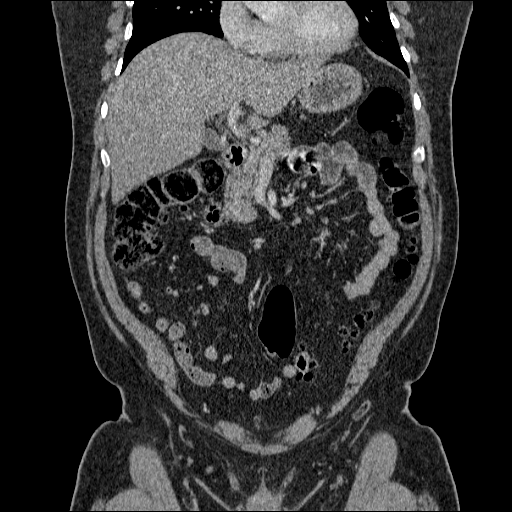
[im 89/161  soft-tissue]
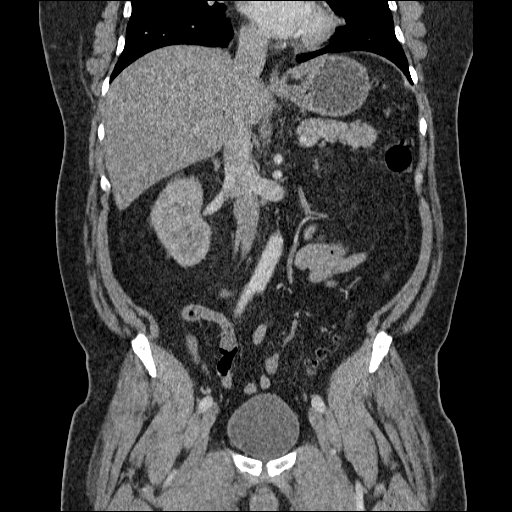

[17 of 46 positions shown; findings below may reference images not displayed]

FINDINGS: The lung bases are clear.  There is no evidence for free
air.  Normal appearance of the liver, gallbladder, portal venous
system, pancreas, spleen, adrenal glands and the left kidney.  The
right kidney appears to be edematous with delayed excretion of
contrast.  The right ureter is mildly dilated due to a stone just
below the right iliac vessels.  The stone measures 5 mm.  There is
no significant abdominal free fluid or lymphadenopathy.  The
appendix has a normal appearance.  There is a very small
periumbilical hernia containing fat.  Normal appearance of the
prostate, seminal vesicles and urinary bladder.  There is a large
amount of stool in the right colon.  No acute bony abnormality.
IMPRESSION: Mild-moderate right hydroureteronephrosis due to 5 mm stone in the
distal right ureter.

## 2012-03-21 ENCOUNTER — Ambulatory Visit (INDEPENDENT_AMBULATORY_CARE_PROVIDER_SITE_OTHER): Payer: Self-pay | Admitting: Internal Medicine

## 2012-03-21 ENCOUNTER — Encounter: Payer: Self-pay | Admitting: Internal Medicine

## 2012-03-21 VITALS — BP 127/84 | HR 75 | Temp 97.2°F | Wt 224.2 lb

## 2012-03-21 DIAGNOSIS — E785 Hyperlipidemia, unspecified: Secondary | ICD-10-CM

## 2012-03-21 DIAGNOSIS — E119 Type 2 diabetes mellitus without complications: Secondary | ICD-10-CM

## 2012-03-21 DIAGNOSIS — Z79899 Other long term (current) drug therapy: Secondary | ICD-10-CM

## 2012-03-21 DIAGNOSIS — I10 Essential (primary) hypertension: Secondary | ICD-10-CM

## 2012-03-21 LAB — LIPID PANEL
Cholesterol: 141 mg/dL (ref 0–200)
Triglycerides: 73 mg/dL (ref <150)
VLDL: 15 mg/dL (ref 0–40)

## 2012-03-21 LAB — COMPLETE METABOLIC PANEL WITH GFR
AST: 19 U/L (ref 0–37)
Albumin: 4.4 g/dL (ref 3.5–5.2)
Alkaline Phosphatase: 33 U/L — ABNORMAL LOW (ref 39–117)
BUN: 17 mg/dL (ref 6–23)
Calcium: 9.8 mg/dL (ref 8.4–10.5)
Chloride: 101 mEq/L (ref 96–112)
Creat: 0.95 mg/dL (ref 0.50–1.35)
GFR, Est Non African American: 85 mL/min
Glucose, Bld: 270 mg/dL — ABNORMAL HIGH (ref 70–99)

## 2012-03-21 MED ORDER — GLIPIZIDE 5 MG PO TABS: 5.0000 mg | ORAL_TABLET | Freq: Two times a day (BID) | ORAL | Status: DC

## 2012-03-21 MED ORDER — ATENOLOL-CHLORTHALIDONE 100-25 MG PO TABS: ORAL_TABLET | ORAL | Status: DC

## 2012-03-21 NOTE — Assessment & Plan Note (Signed)
Patient's blood pressure is well-controlled at this time. He does not know which medication he is on at this time. I have asked him to get his medication at all his appointments. I would followup in 3 months and make sure that he is on work as listed in the chart.

## 2012-03-21 NOTE — Assessment & Plan Note (Addendum)
Patient does not want to use insulin at this time. He has not been using it for last 3 months and doing okay.  I would start the patient on Glucotrol 5 mg twice a day along with metformin and followup in 3 months. I would discontinue insulin from EMR at this time. Patient is motivated to lose weight and has already lost about 10 pounds since last office visit in November. Patient's microalbumin over creatinine ratio is normal at this time. I would of been an ophthalmology exam on him. Followup in 3 months.

## 2012-03-21 NOTE — Assessment & Plan Note (Signed)
I would check lipid panel today. Patient's cholesterol LDL was 130 nine months ago. He was started on a statin at that time.

## 2012-03-21 NOTE — Patient Instructions (Signed)
Diabetes and Exercise Regular exercise is important and can help:   Control blood glucose (sugar).   Decrease blood pressure.    Control blood lipids (cholesterol, triglycerides).   Improve overall health.  BENEFITS FROM EXERCISE  Improved fitness.   Improved flexibility.   Improved endurance.   Increased bone density.   Weight control.   Increased muscle strength.   Decreased body fat.   Improvement of the body's use of insulin, a hormone.   Increased insulin sensitivity.   Reduction of insulin needs.   Reduced stress and tension.   Helps you feel better.  People with diabetes who add exercise to their lifestyle gain additional benefits, including:  Weight loss.   Reduced appetite.   Improvement of the body's use of blood glucose.   Decreased risk factors for heart disease:   Lowering of cholesterol and triglycerides.   Raising the level of good cholesterol (high-density lipoproteins, HDL).   Lowering blood sugar.   Decreased blood pressure.  TYPE 1 DIABETES AND EXERCISE  Exercise will usually lower your blood glucose.   If blood glucose is greater than 240 mg/dl, check urine ketones. If ketones are present, do not exercise.   Location of the insulin injection sites may need to be adjusted with exercise. Avoid injecting insulin into areas of the body that will be exercised. For example, avoid injecting insulin into:   The arms when playing tennis.   The legs when jogging. For more information, discuss this with your caregiver.   Keep a record of:   Food intake.   Type and amount of exercise.   Expected peak times of insulin action.   Blood glucose levels.  Do this before, during, and after exercise. Review your records with your caregiver. This will help you to develop guidelines for adjusting food intake and insulin amounts.  TYPE 2 DIABETES AND EXERCISE  Regular physical activity can help control blood glucose.   Exercise is important  because it may:   Increase the body's sensitivity to insulin.   Improve blood glucose control.   Exercise reduces the risk of heart disease. It decreases serum cholesterol and triglycerides. It also lowers blood pressure.   Those who take insulin or oral hypoglycemic agents should watch for signs of hypoglycemia. These signs include dizziness, shaking, sweating, chills, and confusion.   Body water is lost during exercise. It must be replaced. This will help to avoid loss of body fluids (dehydration) or heat stroke.  Be sure to talk to your caregiver before starting an exercise program to make sure it is safe for you. Remember, any activity is better than none.  Document Released: 12/24/2003 Document Revised: 09/22/2011 Document Reviewed: 04/09/2009 [facility] Patient Information 2012 [place].

## 2012-03-21 NOTE — Progress Notes (Signed)
  Subjective:    Patient ID: John Hobbs, male    DOB: 1946/03/19, 66 y.o.   MRN: [id number]  HPI Patient is here today for a regular follow up.  He has not been using any insulin since last 3 months and his Hba1c is 8.0 today. It was 7.4,  6 months ago. Patient is taking only metformin at this time. Patient has lost about 10 pounds in last 6 months and is motivated to lose more.  BP is well controlled. He did not bring his medications with him at this time.  No other complaints.   Review of Systems  Constitutional: Negative for fever, activity change and appetite change.  HENT: Negative for sore throat.   Respiratory: Negative for cough and shortness of breath.   Cardiovascular: Negative for chest pain and leg swelling.  Gastrointestinal: Negative for nausea, abdominal pain, diarrhea, constipation and abdominal distention.  Genitourinary: Negative for frequency, hematuria and difficulty urinating.  Neurological: Negative for dizziness and headaches.  Psychiatric/Behavioral: Negative for suicidal ideas and behavioral problems.       Objective:   Physical Exam  Constitutional: He is oriented to person, place, and time. He appears well-developed and well-nourished.  HENT:  Head: Normocephalic and atraumatic.  Eyes: Conjunctivae and EOM are normal. Pupils are equal, round, and reactive to light. No scleral icterus.  Neck: Normal range of motion. Neck supple. No JVD present. No thyromegaly present.  Cardiovascular: Normal rate, regular rhythm, normal heart sounds and intact distal pulses.  Exam reveals no gallop and no friction rub.   No murmur heard. Pulmonary/Chest: Effort normal and breath sounds normal. No respiratory distress. He has no wheezes. He has no rales.  Abdominal: Soft. Bowel sounds are normal. He exhibits no distension and no mass. There is no tenderness. There is no rebound and no guarding.  Musculoskeletal: Normal range of motion. He exhibits no edema and no  tenderness.  Lymphadenopathy:    He has no cervical adenopathy.  Neurological: He is alert and oriented to person, place, and time.  Psychiatric: He has a normal mood and affect. His behavior is normal.          Assessment & Plan:

## 2012-03-28 ENCOUNTER — Telehealth: Payer: Self-pay | Admitting: *Deleted

## 2012-03-28 NOTE — Telephone Encounter (Signed)
SPOKE WITH PATIENT, HE WILL MAKE HIS OWN EYE APPT WITH DR Ricki Miller. PATIENT IS SELF PAY.. WILL FOLLOW UP PATIENT TO FIND OUT IF HE MADE AND HAD EYE EXAM DONE.  John Hobbs NT 2-12-013  5:46PM

## 2012-11-19 ENCOUNTER — Encounter: Payer: Self-pay | Admitting: Internal Medicine

## 2012-11-19 ENCOUNTER — Ambulatory Visit (INDEPENDENT_AMBULATORY_CARE_PROVIDER_SITE_OTHER): Payer: BC Managed Care – PPO | Admitting: Internal Medicine

## 2012-11-19 VITALS — BP 165/113 | HR 90 | Temp 97.2°F | Ht 70.0 in | Wt 238.0 lb

## 2012-11-19 DIAGNOSIS — J984 Other disorders of lung: Secondary | ICD-10-CM

## 2012-11-19 DIAGNOSIS — E119 Type 2 diabetes mellitus without complications: Secondary | ICD-10-CM

## 2012-11-19 DIAGNOSIS — E785 Hyperlipidemia, unspecified: Secondary | ICD-10-CM

## 2012-11-19 DIAGNOSIS — Z23 Encounter for immunization: Secondary | ICD-10-CM

## 2012-11-19 DIAGNOSIS — I10 Essential (primary) hypertension: Secondary | ICD-10-CM

## 2012-11-19 DIAGNOSIS — Z79899 Other long term (current) drug therapy: Secondary | ICD-10-CM

## 2012-11-19 DIAGNOSIS — R911 Solitary pulmonary nodule: Secondary | ICD-10-CM

## 2012-11-19 LAB — COMPLETE METABOLIC PANEL WITH GFR
ALT: 18 U/L (ref 0–53)
Albumin: 4.6 g/dL (ref 3.5–5.2)
CO2: 27 mEq/L (ref 19–32)
Calcium: 10.1 mg/dL (ref 8.4–10.5)
Chloride: 102 mEq/L (ref 96–112)
GFR, Est African American: >89 mL/min
GFR, Est Non African American: 82 mL/min
Glucose, Bld: 165 mg/dL — ABNORMAL HIGH (ref 70–99)
Sodium: 139 mEq/L (ref 135–145)
Total Bilirubin: 0.7 mg/dL (ref 0.3–1.2)
Total Protein: 7.6 g/dL (ref 6.0–8.3)

## 2012-11-19 LAB — GLUCOSE, CAPILLARY: Glucose-Capillary: 189 mg/dL — ABNORMAL HIGH (ref 70–99)

## 2012-11-19 MED ORDER — METFORMIN HCL 1000 MG PO TABS: 1000.0000 mg | ORAL_TABLET | Freq: Two times a day (BID) | ORAL | Status: DC

## 2012-11-19 MED ORDER — LISINOPRIL 5 MG PO TABS: 5.0000 mg | ORAL_TABLET | Freq: Every day | ORAL | Status: DC

## 2012-11-19 MED ORDER — GLIPIZIDE 5 MG PO TABS: 5.0000 mg | ORAL_TABLET | Freq: Two times a day (BID) | ORAL | Status: DC

## 2012-11-19 MED ORDER — PRAVASTATIN SODIUM 20 MG PO TABS: 20.0000 mg | ORAL_TABLET | Freq: Every evening | ORAL | Status: DC

## 2012-11-19 MED ORDER — ATENOLOL-CHLORTHALIDONE 100-25 MG PO TABS: ORAL_TABLET | ORAL | Status: DC

## 2012-11-19 NOTE — Assessment & Plan Note (Addendum)
Will add lisinopril 5 mg daily for reno-protective effect and additional antihypertensive effect. We'll repeat basic metabolic profile next week to check potassium and creatinine. Increase the dose of lisinopril if patient is able to tolerate. Followup in one month for blood pressure check.

## 2012-11-19 NOTE — Progress Notes (Signed)
  Subjective:    Patient ID: John Hobbs, male    DOB: September 13, 1946, 67 y.o.   MRN: [id number]  HPI  Patient is here today for a routine follow up.  BP is high today.  HBa1c is 7.6 today which is better than what it was 6 months ago.  Patient's weight is 236 pounds and he is mildly obese.   No complaints.   Review of Systems  Constitutional: Negative for fever, activity change and appetite change.  HENT: Negative for sore throat.   Respiratory: Negative for cough and shortness of breath.   Cardiovascular: Negative for chest pain and leg swelling.  Gastrointestinal: Negative for nausea, abdominal pain, diarrhea, constipation and abdominal distention.  Genitourinary: Negative for frequency, hematuria and difficulty urinating.  Neurological: Negative for dizziness and headaches.  Psychiatric/Behavioral: Negative for suicidal ideas and behavioral problems.       Objective:   Physical Exam  Constitutional: He is oriented to person, place, and time. He appears well-developed and well-nourished.  HENT:  Head: Normocephalic and atraumatic.  Eyes: Conjunctivae normal and EOM are normal. Pupils are equal, round, and reactive to light. No scleral icterus.  Neck: Normal range of motion. Neck supple. No JVD present. No thyromegaly present.  Cardiovascular: Normal rate, regular rhythm, normal heart sounds and intact distal pulses.  Exam reveals no gallop and no friction rub.   No murmur heard. Pulmonary/Chest: Effort normal and breath sounds normal. No respiratory distress. He has no wheezes. He has no rales.  Abdominal: Soft. Bowel sounds are normal. He exhibits no distension and no mass. There is no tenderness. There is no rebound and no guarding.  Musculoskeletal: Normal range of motion. He exhibits no edema and no tenderness.  Lymphadenopathy:    He has no cervical adenopathy.  Neurological: He is alert and oriented to person, place, and time.  Psychiatric: He has a normal mood and  affect. His behavior is normal.          Assessment & Plan:

## 2012-11-19 NOTE — Assessment & Plan Note (Signed)
Eye exam done last year. Foot exam done today. Patient not on ace inhibitor. Will start one today. Hba1c well controlled. Told to lose weight. Follow up in 3 months.

## 2012-11-19 NOTE — Patient Instructions (Signed)
Clinic visit in 1 week for blood test. Pick up new prescription of lisinopril and start taking it. Follow up in 3 months. Lose weight. 5-10% of body weight loss will improve your blood pressure and diabetes dramatically.

## 2012-11-19 NOTE — Assessment & Plan Note (Signed)
Repeat CT chest done in 2010 recommended not to follow up the nodule anymore.

## 2012-11-19 NOTE — Assessment & Plan Note (Signed)
Continue pravachol.

## 2012-11-27 ENCOUNTER — Other Ambulatory Visit (INDEPENDENT_AMBULATORY_CARE_PROVIDER_SITE_OTHER): Payer: BC Managed Care – PPO

## 2012-11-27 DIAGNOSIS — I10 Essential (primary) hypertension: Secondary | ICD-10-CM

## 2012-11-27 LAB — BASIC METABOLIC PANEL WITH GFR
Calcium: 9.3 mg/dL (ref 8.4–10.5)
Creat: 1.08 mg/dL (ref 0.50–1.35)
GFR, Est African American: 83 mL/min
GFR, Est Non African American: 72 mL/min

## 2013-01-29 ENCOUNTER — Other Ambulatory Visit: Payer: Self-pay | Admitting: Internal Medicine

## 2013-03-26 ENCOUNTER — Ambulatory Visit (INDEPENDENT_AMBULATORY_CARE_PROVIDER_SITE_OTHER): Payer: BC Managed Care – PPO | Admitting: Internal Medicine

## 2013-03-26 ENCOUNTER — Ambulatory Visit: Payer: BC Managed Care – PPO | Admitting: Internal Medicine

## 2013-03-26 ENCOUNTER — Ambulatory Visit: Payer: BC Managed Care – PPO

## 2013-03-26 ENCOUNTER — Encounter: Payer: Self-pay | Admitting: Internal Medicine

## 2013-03-26 VITALS — BP 141/94 | HR 86 | Temp 97.5°F | Ht 70.0 in | Wt 240.4 lb

## 2013-03-26 DIAGNOSIS — R05 Cough: Secondary | ICD-10-CM

## 2013-03-26 DIAGNOSIS — N529 Male erectile dysfunction, unspecified: Secondary | ICD-10-CM

## 2013-03-26 DIAGNOSIS — I10 Essential (primary) hypertension: Secondary | ICD-10-CM

## 2013-03-26 DIAGNOSIS — T464X5A Adverse effect of angiotensin-converting-enzyme inhibitors, initial encounter: Secondary | ICD-10-CM

## 2013-03-26 DIAGNOSIS — T44905A Adverse effect of unspecified drugs primarily affecting the autonomic nervous system, initial encounter: Secondary | ICD-10-CM

## 2013-03-26 DIAGNOSIS — E119 Type 2 diabetes mellitus without complications: Secondary | ICD-10-CM

## 2013-03-26 LAB — LIPID PANEL
HDL: 38 mg/dL — ABNORMAL LOW (ref >39)
LDL Cholesterol: 71 mg/dL (ref 0–99)
Total CHOL/HDL Ratio: 4.4 Ratio

## 2013-03-26 LAB — POCT GLYCOSYLATED HEMOGLOBIN (HGB A1C): Hemoglobin A1C: 8.6

## 2013-03-26 MED ORDER — GLIPIZIDE 10 MG PO TABS: 10.0000 mg | ORAL_TABLET | Freq: Two times a day (BID) | ORAL | Status: DC

## 2013-03-26 MED ORDER — TADALAFIL 5 MG PO TABS: 5.0000 mg | ORAL_TABLET | Freq: Every day | ORAL | Status: DC | PRN

## 2013-03-26 MED ORDER — LOSARTAN POTASSIUM 50 MG PO TABS: 50.0000 mg | ORAL_TABLET | Freq: Every day | ORAL | Status: DC

## 2013-03-26 NOTE — Assessment & Plan Note (Signed)
BP Readings from Last 3 Encounters:  03/26/13 141/94  11/19/12 165/113  03/21/12 127/84    Lab Results  Component Value Date   NA 140 11/27/2012   K 3.8 11/27/2012   CREATININE 1.08 11/27/2012    Assessment: Blood pressure control: mildly elevated Progress toward BP goal:  unchanged Comments: BP improved since last visit, though still mildly elevated.  Plan: Medications:  Stop lisinopril due to cough.  Start losartan 50.  If BP remains elevated at next visit, consider uptitrating losartan. Educational resources provided: brochure Self management tools provided:   Other plans: Recheck BP at next visit.

## 2013-03-26 NOTE — Assessment & Plan Note (Signed)
The patient presents with a 72-monthhistory of non-productive cough after starting lisinopril, with no other associated symptoms.  He recalls a similar reaction in the past.  Symptoms seem consistent with ACE-I induced cough.  The patient has DM, with 1 documented episode of microalbuminuria in 2012. -stop lisinopril -start losartan

## 2013-03-26 NOTE — Progress Notes (Signed)
HPI The patient is a 67 y.o. male with a history of DM, HTN, HL, presenting for an acute visit for cough.  The patient notes a 61-monthhistory of non-productive cough.  No rhinorrhea, congestion, ST, sinus pressure, fevers, sick contacts. He does note a "scratchy" feeling in the back of his throat.  The patient believes he was on a similar medication in the past, with the same reaction.  The patient has a history of DM.  He did not bring his glucometer.  The patient notes that he hasn't checked his blood sugar in weeks, but when he used to do so his blood sugars were in the 180-200's.  His A1C has increased from 7.6 to 8.6.  He notes no symptoms of diaphoresis, tremulousness, lightheadedness, AMS, blurry vision, or polyuria/polydipsia.  The patient notes difficulty achieving erection.  He has tried full-strength cialis in the past, but noted side effects of headache.  ROS: General: no fevers, chills, changes in weight, changes in appetite Skin: no rash HEENT: no blurry vision, hearing changes, sore throat Pulm: no dyspnea, wheezing CV: no chest pain, palpitations, shortness of breath Abd: no abdominal pain, nausea/vomiting, diarrhea/constipation GU: no dysuria, hematuria, polyuria Ext: no arthralgias, myalgias Neuro: no weakness, numbness, or tingling  Filed Vitals:   03/26/13 1443  BP: 141/94  Pulse: 86  Temp: 97.5 F (36.4 C)    PEX General: alert, cooperative, and in no apparent distress HEENT: pupils equal round and reactive to light, vision grossly intact, oropharynx clear and non-erythematous  Neck: supple, no lymphadenopathy Lungs: clear to ascultation bilaterally, normal work of respiration, no wheezes, rales, ronchi Heart: regular rate and rhythm, no murmurs, gallops, or rubs Abdomen: soft, non-tender, non-distended, normal bowel sounds Extremities: no cyanosis, clubbing, or edema Neurologic: alert & oriented X3, cranial nerves II-XII intact, strength grossly intact,  sensation intact to light touch  Current Outpatient Prescriptions on File Prior to Visit  Medication Sig Dispense Refill  . aspirin 81 MG tablet Take 81 mg by mouth daily.        .Marland Kitchenatenolol-chlorthalidone (TENORETIC 100) 100-25 MG per tablet Take one half tablet daily  90 tablet  3  . glipiZIDE (GLUCOTROL) 5 MG tablet Take 1 tablet (5 mg total) by mouth 2 (two) times daily before a meal.  60 tablet  11  . lisinopril (PRINIVIL,ZESTRIL) 5 MG tablet TAKE ONE TABLET BY MOUTH EVERY DAY  30 tablet  5  . metFORMIN (GLUCOPHAGE) 1000 MG tablet Take 1 tablet (1,000 mg total) by mouth 2 (two) times daily with a meal.  180 tablet  3  . pravastatin (PRAVACHOL) 20 MG tablet Take 1 tablet (20 mg total) by mouth every evening.  90 tablet  3   No current facility-administered medications on file prior to visit.    Assessment/Plan

## 2013-03-26 NOTE — Assessment & Plan Note (Signed)
The patient notes erectile dysfunction.  He has risk factors of diabetes, HTN.  He notes headache with full-strength cialis. -we will try daily dosing of cialis 5 mg, rather than the standard 10-20 mg prn dosing, to try to minimize side effects with lower doses of medication -we discussed the importance of not taking nitrate medications while on this medication -address diabetes control per above

## 2013-03-26 NOTE — Assessment & Plan Note (Signed)
Lab Results  Component Value Date   HGBA1C 8.6 03/26/2013   HGBA1C 7.6 11/19/2012   HGBA1C 8.0 03/21/2012     Assessment: Diabetes control: fair control Progress toward A1C goal:  deteriorated Comments: The patient's A1C has worsened from 7.6 to 8.6.  We will increase his glipizide today.  Plan: Medications:  Increase glipizide from 5 mg BID to 10 mg BID (tapering up over the course of 1 week).  Continue metformin 1000 mg BID.  Insulin may be needed in the not too distant future. Home glucose monitoring: Frequency: once a day Timing: before meals Instruction/counseling given: reminded to bring blood glucose meter & log to each visit Educational resources provided: brochure Self management tools provided:   Other plans: Checking urine microalbumin today.  Checking lipid panel today.

## 2013-03-26 NOTE — Patient Instructions (Signed)
General Instructions: Your cough is likely due to a side effect of lisinopril.  We will STOP lisinopril. -please START taking Losartan, 1 tablet once per day, for blood pressure and to protect your kidneys  For your diabetes, your Hemoglobin A1C has increased from 7.6 to 8.6, representing worse blood sugar control -for the next week, take Glipizide, 10 mg every morning, 5 mg every evening -after 1 week, increase to 10 mg every morning, 10 mg every evening -I've called in a new prescription to your pharmacy with 10 mg tablets  For your Erectile Dysfunction, we are starting Cialis.  Take 1 tablet daily, as needed.  It is important to never take a medication called a Nitrate (ie nitroglycerine) while taking this medication.  Treatment Goals:  Goals (1 Years of Data) as of 03/26/13   None      Progress Toward Treatment Goals:  Treatment Goal 03/26/2013  Hemoglobin A1C deteriorated  Blood pressure unchanged    Self Care Goals & Plans:  Self Care Goal 03/26/2013  Manage my medications take my medicines as prescribed; bring my medications to every visit; refill my medications on time  Eat healthy foods drink diet soda or water instead of juice or soda; eat more vegetables; eat foods that are low in salt; eat baked foods instead of fried foods    Home Blood Glucose Monitoring 03/26/2013  Check my blood sugar once a day  When to check my blood sugar before meals     Care Management & Community Referrals:  Referral 03/26/2013  Referrals made for care management support none needed

## 2013-04-09 NOTE — Progress Notes (Signed)
Case discussed with Dr. Owens Shark (at time of visit, soon after the resident saw the patient).  We reviewed the resident's history and exam and pertinent patient test results.  I agree with the assessment, diagnosis, and plan of care documented in the resident's note.

## 2013-04-15 ENCOUNTER — Telehealth: Payer: Self-pay | Admitting: *Deleted

## 2013-04-15 NOTE — Telephone Encounter (Signed)
Agree with the plan.

## 2013-04-15 NOTE — Telephone Encounter (Signed)
Pt's wife calls and states pt has a fine rash x 1 week, itchy and becoming worse, rask is only on shoulders, back. Denies drainage, fever, pain. appt 7/1 at [place] dr Nicoletta Dress

## 2013-04-16 ENCOUNTER — Encounter: Payer: Self-pay | Admitting: Internal Medicine

## 2013-04-16 ENCOUNTER — Ambulatory Visit (INDEPENDENT_AMBULATORY_CARE_PROVIDER_SITE_OTHER): Payer: BC Managed Care – PPO | Admitting: Internal Medicine

## 2013-04-16 VITALS — BP 141/96 | HR 57 | Temp 97.0°F | Wt 239.9 lb

## 2013-04-16 DIAGNOSIS — R21 Rash and other nonspecific skin eruption: Secondary | ICD-10-CM

## 2013-04-16 DIAGNOSIS — I10 Essential (primary) hypertension: Secondary | ICD-10-CM

## 2013-04-16 MED ORDER — PREDNISONE (PAK) 10 MG PO TABS: 10.0000 mg | ORAL_TABLET | Freq: Every day | ORAL | Status: DC

## 2013-04-16 MED ORDER — DIPHENHYDRAMINE HCL 25 MG PO CAPS: 25.0000 mg | ORAL_CAPSULE | Freq: Four times a day (QID) | ORAL | Status: DC | PRN

## 2013-04-16 NOTE — Assessment & Plan Note (Addendum)
Losartan was started on 03/26/2013, and he developed skin rash shortly after initiation of losartan.  Unsure the etiology of his rash since he has had new personal care products and new food at the same time.  -  Losartan is stopped temporarily -  Patient is instructed to follow up with the clinic for blood pressure monitoring in 2 weeks. If his rash is completely resolved, may consider to carefully resume losartan and monitor him closely.

## 2013-04-16 NOTE — Patient Instructions (Addendum)
1. Will stop losartan for now. 2. Please stop Cantus, your new lotion and deodorant 3. Will give you prednisone tapering  Tapering 42m x 1day, 554mx 1 day, 40 mg x 1 day, 30 mg x 1 day, 20 mg x 1 day, 10 mg x 1 day. Then stop. 4. and benadryl 25 mg po every 6 hours as needed for itching 5. Call the clinic if your rash becomes worse, or develops fever, chills, shortness of breath.  6. Follow up in 2 weeks for BP .
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
## 2013-04-16 NOTE — Assessment & Plan Note (Addendum)
The clinical manifestation is consistent with allergic reaction. He started an new deodorant, new body wash, new food (canned cactus), new medication losartan within last two weeks.   - will ask him to stop any new deodorant, new body wash/lotion. -Stop new food canned cactus. - Stop losartan for now - Will give Benadryl 25 mg by mouth every 6 hours as needed for itching -Quick tapering prednisone 60-50-40-30-25-10. - Patient is instructed to call the clinical goal to ED if his symptoms worsened, or he develops fever, chills or shortness of breath.

## 2013-04-16 NOTE — Progress Notes (Signed)
Prednisone was marked - phone in. Rx called in to pharmacy. Hilda Blades Zenita Kister RN 04/16/13 4PM

## 2013-04-17 NOTE — Progress Notes (Signed)
Case discussed with Dr. Nicoletta Dress (at time of visit, soon after the resident saw the patient).  We reviewed the resident's history and exam and pertinent patient test results.  I agree with the assessment, diagnosis, and plan of care documented in the resident's note.

## 2013-04-25 ENCOUNTER — Other Ambulatory Visit: Payer: Self-pay

## 2013-04-30 ENCOUNTER — Encounter: Payer: Self-pay | Admitting: Internal Medicine

## 2013-04-30 ENCOUNTER — Ambulatory Visit (INDEPENDENT_AMBULATORY_CARE_PROVIDER_SITE_OTHER): Payer: BC Managed Care – PPO | Admitting: Internal Medicine

## 2013-04-30 ENCOUNTER — Telehealth: Payer: Self-pay | Admitting: Dietician

## 2013-04-30 VITALS — BP 131/86 | HR 76 | Temp 97.6°F | Ht 70.0 in | Wt 242.1 lb

## 2013-04-30 DIAGNOSIS — R21 Rash and other nonspecific skin eruption: Secondary | ICD-10-CM

## 2013-04-30 DIAGNOSIS — N529 Male erectile dysfunction, unspecified: Secondary | ICD-10-CM

## 2013-04-30 DIAGNOSIS — E119 Type 2 diabetes mellitus without complications: Secondary | ICD-10-CM

## 2013-04-30 DIAGNOSIS — I10 Essential (primary) hypertension: Secondary | ICD-10-CM

## 2013-04-30 MED ORDER — GLUCOSE BLOOD VI STRP: ORAL_STRIP | Status: DC

## 2013-04-30 MED ORDER — HYDROXYZINE HCL 25 MG PO TABS: 25.0000 mg | ORAL_TABLET | Freq: Three times a day (TID) | ORAL | Status: DC | PRN

## 2013-04-30 MED ORDER — SITAGLIPTIN PHOSPHATE 100 MG PO TABS: 100.0000 mg | ORAL_TABLET | Freq: Every day | ORAL | Status: DC

## 2013-04-30 MED ORDER — DIPHENHYDRAMINE-ZINC ACETATE 2-0.1 % EX CREA: TOPICAL_CREAM | Freq: Three times a day (TID) | CUTANEOUS | Status: DC | PRN

## 2013-04-30 MED ORDER — ONETOUCH ULTRA SYSTEM W/DEVICE KIT: 1.0000 | PACK | Freq: Once | Status: DC

## 2013-04-30 MED ORDER — TADALAFIL 5 MG PO TABS: 5.0000 mg | ORAL_TABLET | Freq: Every day | ORAL | Status: DC | PRN

## 2013-04-30 NOTE — Progress Notes (Signed)
  Subjective:    Patient ID: John Hobbs, male    DOB: 02-May-1946, 67 y.o.   MRN: [id number]  HPI John Hobbs is a 67 y.o. man with PMH of DM, HTN, HLD, Gout, Cocaine abuse, who presents to the clinic for follow up of skin rash and blood pressure.  He saw Dr. Nicoletta Dress on 04/16/13 for a rash on his upper body after starting Losartan as well as several new personal care products (body wash, deodorant). The rash was though to be allergic so he was instructed to temporarily discontinue Losartan, stop the new bath products, take Benadryl 69m q6h prn, and complete quick tapering prednisone course (65-50-40-30-25-10).  The rash went away completely 3-4 days after starting this regimen. However, it came back just yesterday. It is still located on his upper body and itchy, but less severe than it was when he saw Dr. LNicoletta Dress He denies any new laundry detergents, personal care products, foods, or medications. He notes he spent time outside in the heat yesterday, and he has a history of developing heat rash. Denies fevers, chills, arthralgias, dysuria. He is allergic to Ace-I (cough) and has some seasonal allergies. Denies any other medication or food allergies.  Of note, he says he still had a mild non-productive cough after being switched from an ACE-I to Losartan. This resolved after discontinuing the Losartan for good due to his rash.  He has been monitoring his CBGs at home and self-reports a range from 150-220s. In the office this morning it is 183. He is taking Metformin 650mBID and glipizide 1021mID. His diabetic eye exam is due. He says aware and will schedule an appointment himself.   There was a problem with his previous Cialis prescription at the pharmacy and he was never able to pick it up. He would like me to reorder it.    Review of Systems  Constitutional: Negative for fever and chills.  HENT: Negative for congestion and mouth sores.   Eyes: Negative for visual disturbance.   Respiratory: Negative for cough and shortness of breath.   Cardiovascular: Negative for chest pain.  Gastrointestinal: Negative for nausea, vomiting and diarrhea.  Endocrine: Negative for polyuria.  Genitourinary: Negative for dysuria and flank pain.  Musculoskeletal: Negative for arthralgias.  Skin: Positive for rash.  Allergic/Immunologic: Positive for environmental allergies (Seasonal allergies.).  Neurological: Negative for dizziness, light-headedness and headaches.       Objective:   Physical Exam  Constitutional: He is oriented to person, place, and time. He appears well-developed and well-nourished.  HENT:  Head: Normocephalic and atraumatic.  Mouth/Throat: Oropharynx is clear and moist.  Eyes: EOM are normal. Pupils are equal, round, and reactive to light.  Neck: Normal range of motion. Neck supple.  Cardiovascular: Normal rate, regular rhythm, normal heart sounds and intact distal pulses.  Exam reveals no gallop and no friction rub.   No murmur heard. Pulmonary/Chest: Effort normal and breath sounds normal.  Abdominal: Soft. Bowel sounds are normal.  Musculoskeletal: Normal range of motion. He exhibits no edema and no tenderness.  Neurological: He is alert and oriented to person, place, and time. He has normal reflexes. No cranial nerve deficit.  Skin: Skin is warm and dry. Rash (Fine maculopapular, follicular skin rash on upper back and shoulders. Discrete lesions, <3mm68mo excoriation, erosion, or pigmentation change.) noted.      Assessment & Plan:

## 2013-04-30 NOTE — Assessment & Plan Note (Addendum)
BP Readings from Last 3 Encounters:  04/30/13 131/86  04/16/13 141/96  03/26/13 141/94    Lab Results  Component Value Date   NA 140 11/27/2012   K 3.8 11/27/2012   CREATININE 1.08 11/27/2012    Assessment: Blood pressure control: controlled Progress toward BP goal:  at goal  Plan: Medications:  continue current medications Other plans: Patient reports he still had a dry, nonproductive cough symptoms with Losartan that resolved after stopping the medication due to rash. Since his BP is controlled this visit I will delay adding back the Losartan, though we might want to think about trying it again in the future due to his microalbuminuria (2.75).

## 2013-04-30 NOTE — Assessment & Plan Note (Addendum)
Lab Results  Component Value Date   HGBA1C 8.6 03/26/2013   HGBA1C 7.6 11/19/2012   HGBA1C 8.0 03/21/2012     Assessment: Diabetes control: fair control Progress toward A1C goal:  unchanged  Plan: Medications:  Start Januvia 141m daily Home glucose monitoring: Frequency: 3 times a day Timing: before breakfast;before lunch;before dinner Instruction/counseling given: reminded to get eye exam, reminded to bring blood glucose meter & log to each visit, reminded to bring medications to each visit and discussed foot care Self management tools provided: home glucose testing supplies;instructions for home glucose monitoring;home glucose meter

## 2013-04-30 NOTE — Telephone Encounter (Signed)
Opened in error

## 2013-04-30 NOTE — Assessment & Plan Note (Signed)
-   Reordered Cialis as there was apparently a problem at the pharmacy and the patient never got the medication.

## 2013-04-30 NOTE — Patient Instructions (Addendum)
Thank you for your visit. - Your rash is likely due to heat rash, also called miliaria. To prevent this rash in the future, take measures to reduce your sweating including cool baths and cold compresses. Wear light, loose clothes. Change your shirt often if you are sweating. - I have prescribed a Benadryl cream to rub on your rash if it becomes very itchy. - I have prescribed Atarax pills for itching. This is an oral medication similar to Benadryl that will make you less drowsy. Take 1 pill every 3-4 hours as needed. - Please start taking Januvia for your diabetes. If this medication is too expense at the pharmacy, call our office and we will make an adjustment. However, it's likely your insurance will cover it. - Measure and record your blood sugars daily before meals. I have written a prescription for you to get a new glucometer with test strips. - I have reordered your Cialis. - Please make an appointment to see your eye doctor this summer. - Please return to see me in 1 month.

## 2013-04-30 NOTE — Assessment & Plan Note (Addendum)
I believe his rash represents heat rash, or miliaria, given the timing of symptoms with working outside in the heat (>95 degrees F yesterday), personal history of heat rash, and follicular, maculopapular appearance on areas of the body typically in close contact with sweaty clothes (shoulders, back). - Patient was instructed to take measures to reduce sweating including cool baths and cold compresses. Wear light, loose clothes. Change shirt often if sweating. - Added topical Benadryl cream and Atarax pills 47m every 3-4 hours as needed for pruritis

## 2013-05-02 NOTE — Progress Notes (Signed)
I saw and evaluated the patient.  I personally confirmed the key portions of the history and exam documented by Dr. Lucila Maine and I reviewed pertinent patient test results.  The assessment, diagnosis, and plan were formulated together and I agree with the documentation in the resident's note.  I agree with Dr. Leroy Kennedy rash assessment and plan for diabetes management.  Mr. Witman will need to be seen back in 2-3 months for further assessment of DM.

## 2013-06-24 ENCOUNTER — Encounter: Payer: Self-pay | Admitting: Internal Medicine

## 2013-06-24 ENCOUNTER — Ambulatory Visit (INDEPENDENT_AMBULATORY_CARE_PROVIDER_SITE_OTHER): Payer: BC Managed Care – PPO | Admitting: Internal Medicine

## 2013-06-24 VITALS — BP 140/88 | HR 75 | Temp 97.8°F | Ht 70.0 in | Wt 235.9 lb

## 2013-06-24 DIAGNOSIS — Z23 Encounter for immunization: Secondary | ICD-10-CM

## 2013-06-24 DIAGNOSIS — I10 Essential (primary) hypertension: Secondary | ICD-10-CM

## 2013-06-24 DIAGNOSIS — E119 Type 2 diabetes mellitus without complications: Secondary | ICD-10-CM

## 2013-06-24 DIAGNOSIS — E785 Hyperlipidemia, unspecified: Secondary | ICD-10-CM

## 2013-06-24 DIAGNOSIS — N529 Male erectile dysfunction, unspecified: Secondary | ICD-10-CM

## 2013-06-24 LAB — POCT GLYCOSYLATED HEMOGLOBIN (HGB A1C): Hemoglobin A1C: 7.5

## 2013-06-24 MED ORDER — ZOSTER VACCINE LIVE 19400 UNT/0.65ML ~~LOC~~ SOLR: 0.6500 mL | Freq: Once | SUBCUTANEOUS | Status: DC

## 2013-06-24 MED ORDER — LOSARTAN POTASSIUM 50 MG PO TABS: 50.0000 mg | ORAL_TABLET | Freq: Every day | ORAL | Status: DC

## 2013-06-24 MED ORDER — METFORMIN HCL ER (OSM) 1000 MG PO TB24: 1000.0000 mg | ORAL_TABLET | Freq: Every day | ORAL | Status: DC

## 2013-06-24 NOTE — Assessment & Plan Note (Addendum)
Lab Results  Component Value Date   HGBA1C 7.5 06/24/2013   HGBA1C 8.6 03/26/2013   HGBA1C 7.6 11/19/2012     Assessment: Diabetes control: fair control Progress toward A1C goal:  improved   Plan: Medications:  continue current medications, Metformin 2 g daily, Glipizide 26m BID, Januvia 1082mdaily Home glucose monitoring: Frequency: 2 times a day Timing:   Instruction/counseling given: reminded to get eye exam, reminded to bring blood glucose meter & log to each visit, reminded to bring medications to each visit and discussed foot care Educational resources provided:   Self management tools provided:   Other plans: Patient's A1c has improved from 8.6 to 7.5.  However he has not been taking Januvia. I called the pharmacy and requested that a prior authorization form faxed over to me so that I can fill it out as he would likely benefit and is not at goal. I also changed his metformin to metformin extended release to improve patient compliance.

## 2013-06-24 NOTE — Assessment & Plan Note (Signed)
Patient was unable to get prescription as prior authorization was never sent [place].  I called the pharmacy and requested that they send a prior authorization. Continue Cialis

## 2013-06-24 NOTE — Patient Instructions (Signed)
1.  Continue to walk everyday your A1C improved to 7.5, our goal is to have it below 7.  Make sure to pick up and start taking januvia as this will help your blood sugars.  I want you to check you blood sugar twice a day. 2.  Pick up and start taking Losartan 57m daily, this is for blood pressure and you should not have a cough.  Call if you have a reaction to the medication. 3.  Ask [facility] about getting the Zostavax vaccine for shingles.

## 2013-06-24 NOTE — Assessment & Plan Note (Signed)
LDL at goal will continue pravastatin 50m at this time.

## 2013-06-24 NOTE — Assessment & Plan Note (Addendum)
BP Readings from Last 3 Encounters:  06/24/13 140/88  04/30/13 131/86  04/16/13 141/96    Lab Results  Component Value Date   NA 140 11/27/2012   K 3.8 11/27/2012   CREATININE 1.08 11/27/2012    Assessment: Blood pressure control: mildly elevated Progress toward BP goal:  improved   Plan: Medications:  Atenolol-Chlorthalidone 100-25, Losartan 39m Educational resources provided:   Self management tools provided:   Other plans: Patient has co-morbidity of diabetes, was unable to tolerate lisinopril due to cough. He was switched to losartan which was subsequently discontinued because of rash, while it was unsure if losartan caused the rash it was nonetheless discontinued. Patient today insists that he is never taken losartan. This patient still is not at his blood pressure goal and would likely benefit from an ARB I will re-prescribe losartan. Patient should discontinue and call office if he develops a rash.
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
## 2013-06-28 NOTE — Progress Notes (Signed)
I saw and evaluated the patient.  I personally confirmed the key portions of the history and exam documented by Dr. Hoffman and I reviewed pertinent patient test results.  The assessment, diagnosis, and plan were formulated together and I agree with the documentation in the resident's note. 

## 2013-07-18 ENCOUNTER — Telehealth: Payer: Self-pay | Admitting: *Deleted

## 2013-07-18 NOTE — Telephone Encounter (Signed)
Wife called for pt - problems with a cough  for  3 weeks since on Losartan. Pt has stopped med himself. Denies rash. Uses Forensic scientist. Hilda Blades Ditler RN 07/18/13 2PM

## 2013-07-18 NOTE — Telephone Encounter (Signed)
Patient's BP was not significantly elevated at last visit, he should be fine with stopping medication.  As he has diabetes he would likely benefit from an ARB, we could consider switching to alternate ARB at next visit, as well as recheck BP.

## 2013-07-22 NOTE — Telephone Encounter (Signed)
Talked with wife and states pt checks BP - if BP becomes elevated to call clinic for appt. Read note of Dr Heber Hitchita to wife. Cough is improving slowly. Hilda Blades Aaliyha Mumford RN 07/22/13 9:30AM

## 2013-10-17 LAB — HM DIABETES EYE EXAM

## 2013-10-22 ENCOUNTER — Ambulatory Visit (INDEPENDENT_AMBULATORY_CARE_PROVIDER_SITE_OTHER): Payer: Medicare HMO | Admitting: Internal Medicine

## 2013-10-22 ENCOUNTER — Encounter: Payer: Self-pay | Admitting: Internal Medicine

## 2013-10-22 ENCOUNTER — Ambulatory Visit (HOSPITAL_COMMUNITY)
Admission: RE | Admit: 2013-10-22 | Discharge: 2013-10-22 | Disposition: A | Discharge status: Home / Self Care | Payer: Medicare HMO | Source: Ambulatory Visit | Attending: Internal Medicine | Admitting: Internal Medicine

## 2013-10-22 VITALS — BP 143/98 | HR 85 | Temp 97.2°F | Ht 70.0 in | Wt 247.0 lb

## 2013-10-22 DIAGNOSIS — R0789 Other chest pain: Secondary | ICD-10-CM

## 2013-10-22 DIAGNOSIS — E119 Type 2 diabetes mellitus without complications: Secondary | ICD-10-CM

## 2013-10-22 DIAGNOSIS — E785 Hyperlipidemia, unspecified: Secondary | ICD-10-CM

## 2013-10-22 DIAGNOSIS — R079 Chest pain, unspecified: Secondary | ICD-10-CM | POA: Insufficient documentation

## 2013-10-22 DIAGNOSIS — I10 Essential (primary) hypertension: Secondary | ICD-10-CM

## 2013-10-22 HISTORY — DX: Other chest pain: R07.89

## 2013-10-22 LAB — COMPLETE METABOLIC PANEL WITH GFR
ALK PHOS: 37 U/L — AB (ref 39–117)
ALT: 23 U/L (ref 0–53)
AST: 24 U/L (ref 0–37)
Albumin: 4.3 g/dL (ref 3.5–5.2)
BILIRUBIN TOTAL: 0.7 mg/dL (ref 0.3–1.2)
BUN: 14 mg/dL (ref 6–23)
CO2: 28 mEq/L (ref 19–32)
CREATININE: 1.01 mg/dL (ref 0.50–1.35)
Calcium: 9.6 mg/dL (ref 8.4–10.5)
Chloride: 98 mEq/L (ref 96–112)
GFR, Est African American: >89 mL/min
GFR, Est Non African American: 78 mL/min
Glucose, Bld: 269 mg/dL — ABNORMAL HIGH (ref 70–99)
Potassium: 3.5 mEq/L (ref 3.5–5.3)
Sodium: 135 mEq/L (ref 135–145)
Total Protein: 7.3 g/dL (ref 6.0–8.3)

## 2013-10-22 LAB — CBC
HEMATOCRIT: 43.7 % (ref 39.0–52.0)
Hemoglobin: 15.1 g/dL (ref 13.0–17.0)
MCH: 28.4 pg (ref 26.0–34.0)
MCHC: 34.6 g/dL (ref 30.0–36.0)
MCV: 82.3 fL (ref 78.0–100.0)
Platelets: 268 10*3/uL (ref 150–400)
RBC: 5.31 MIL/uL (ref 4.22–5.81)
RDW: 14.5 % (ref 11.5–15.5)
WBC: 7.2 10*3/uL (ref 4.0–10.5)

## 2013-10-22 LAB — POCT GLYCOSYLATED HEMOGLOBIN (HGB A1C): HEMOGLOBIN A1C: 8.5

## 2013-10-22 LAB — GLUCOSE, CAPILLARY: GLUCOSE-CAPILLARY: 244 mg/dL — AB (ref 70–99)

## 2013-10-22 MED ORDER — PRAVASTATIN SODIUM 20 MG PO TABS: 20.0000 mg | ORAL_TABLET | Freq: Every evening | ORAL | Status: DC

## 2013-10-22 MED ORDER — ATENOLOL-CHLORTHALIDONE 100-25 MG PO TABS: ORAL_TABLET | ORAL | Status: DC

## 2013-10-22 MED ORDER — LOSARTAN POTASSIUM 100 MG PO TABS: 100.0000 mg | ORAL_TABLET | Freq: Every day | ORAL | Status: DC

## 2013-10-22 MED ORDER — METFORMIN HCL ER (OSM) 500 MG PO TB24: 1500.0000 mg | ORAL_TABLET | Freq: Every day | ORAL | Status: DC

## 2013-10-22 NOTE — Assessment & Plan Note (Addendum)
Assessment:  He reports about 10 separate episodes of 7/10, sharp left sided CP lasting 2-3 minutes on each occasion and not brought on by exertion.  The episodes occurred over a period of several weeks and he has not experienced this pain in over 1 week and denies CP at present.  He has significant risk factors for CVD including age, gender, HTN, HLD and DM.  EKG in clinic without evidence of acute ischemia.  He is hemodynamically stable.    Plan:  1) Advised the patient to go to the ED if he experiences prolonged CP > 5-10 minutes, increasing frequency of these shorter episodes, or worsening in symptoms.              2) Will refer to cardiology for follow-up and possible stress test.

## 2013-10-22 NOTE — Patient Instructions (Addendum)
1. Your new prescriptions are at your pharmacy.  Please take Metformin 1574m (three 5040mtablets) every morning with breakfast.  Please take Losartan 10046maily.   2. Please take all medications as prescribed.    3. If you have worsening of your symptoms or new symptoms arise, please call the clinic ([phone] go to the ER immediately if symptoms are severe.  4. You will receive a phone call when your cardiology appointment has been scheduled.  5. Please see DonButch Penny soon as you can for your retinal scan.  6.  We will see you back in clinic in 1 month.

## 2013-10-22 NOTE — Assessment & Plan Note (Addendum)
BP Readings from Last 3 Encounters:  10/22/13 143/98  06/24/13 140/88  04/30/13 131/86    Lab Results  Component Value Date   NA 140 11/27/2012   K 3.8 11/27/2012   CREATININE 1.08 11/27/2012    Assessment: Blood pressure control:  mildly elevated Progress toward BP goal:   unchanged Comments: He is doing a good job monitoring his pressures at home.  He feels that his BP was better controlled with amlodipine.  Given his DM he does benefit from ACEI/ARB treatment.  He could not tolerate ACEI due to cough so he is on an ARB.  Plan: Medications:  Increase Losartan from 29m daily to 1074mdaily.  Continue atenolol-chlorthalidone 50-12.2m43maily.   Other plans: Will re-assess BP in 1 month

## 2013-10-22 NOTE — Assessment & Plan Note (Addendum)
Lab Results  Component Value Date   HGBA1C 8.5 10/22/2013   HGBA1C 7.5 06/24/2013   HGBA1C 8.6 03/26/2013     Assessment: Diabetes control:  uncontrolled Progress toward A1C goal:   deteriorated Comments: The patient has been unable to obtain Januvia do to insurance issue.  He is complaint with    Plan: Medications:  Increase Metformin ER from 1050m daily to 15027mdaily; continue Glipizide 103mID Instruction/counseling given: increase BG monitoring frequency to at least daily and bring log or meter to next visit. Re-assess in 1 month.

## 2013-10-22 NOTE — Progress Notes (Signed)
   Subjective:    Patient ID: John Hobbs, male    DOB: 03-Mar-1946, 68 y.o.   MRN: [id number]  HPI Comments: John Hobbs is a 68 year old male with a PMH of HTN, DM type 2 (Hgb A1c 7.5, Sept 2014), HLD who presents for follow-up of his BP and DM.  John Hobbs did not bring his meter and says John Hobbs rarely checks his sugar.  His fasting BG in clinic this morning is 244.  John Hobbs denies any recent episodes of hypoglycemia and says John Hobbs has been running in the 200s.  John Hobbs does check is BP at home and reports SBPs 140-150s and DBP 90-100s.  John Hobbs says John Hobbs feels amlodipine was more beneficial for him than Losartan, which was recently added four months ago.  John Hobbs denies headache or vision changes.  John Hobbs reports compliance with his medications but says John Hobbs only takes Metformin and Glipizide for DM.  John Hobbs says John Hobbs does not take Januvia.  When I called the pharmacy they informed me that his insurance does not cover Claude.    During review of systems John Hobbs reported some occasional, sharp, 7/10 left sided CP that John Hobbs had noticed about 10 times in the past few weeks.  John Hobbs said the pain would occur suddenly and last for about 2-3 minutes and then resolved on its own.  John Hobbs has noticed the pain at rest and says it is not brought on by exertion.  John Hobbs denies chest pressure/tightness, N/V, diaphoresis or SOB during these events.  John Hobbs says John Hobbs has not had this pain in over 1 week and denies current CP.     Review of Systems  Constitutional: Negative for appetite change.  HENT: Negative for rhinorrhea, sneezing and sore throat.   Eyes: Negative for visual disturbance.  Respiratory: Negative for cough and shortness of breath.   Cardiovascular: Positive for chest pain. Negative for palpitations.  Gastrointestinal: Negative for nausea, vomiting, abdominal pain, diarrhea, constipation and blood in stool.  Genitourinary: Negative for dysuria and hematuria.  Neurological: Negative for dizziness, syncope, light-headedness and headaches.       Objective:   Physical Exam  Vitals reviewed. Constitutional: John Hobbs is oriented to person, place, and time. John Hobbs appears well-developed and well-nourished. No distress.  HENT:  Head: Normocephalic and atraumatic.  Mouth/Throat: Oropharynx is clear and moist. No oropharyngeal exudate.  Eyes: EOM are normal. Pupils are equal, round, and reactive to light. No scleral icterus.  Neck: Neck supple. No JVD present.  Cardiovascular: Normal rate, regular rhythm and normal heart sounds.   Pulmonary/Chest: Effort normal and breath sounds normal. No respiratory distress. John Hobbs has no wheezes. John Hobbs has no rales.  Abdominal: Soft. Bowel sounds are normal. John Hobbs exhibits no distension. There is no tenderness.  Musculoskeletal: Normal range of motion.  Lymphadenopathy:    John Hobbs has no cervical adenopathy.  Neurological: John Hobbs is alert and oriented to person, place, and time. No cranial nerve deficit. John Hobbs exhibits normal muscle tone.  Skin: Skin is warm. John Hobbs is not diaphoretic.  Psychiatric: John Hobbs has a normal mood and affect. His behavior is normal.          Assessment & Plan:  Please see problem based assessment and plan.

## 2013-10-24 NOTE — Progress Notes (Signed)
I saw and evaluated the patient.  I personally confirmed the key portions of the history and exam documented by Dr. Wilson and I reviewed pertinent patient test results.  The assessment, diagnosis, and plan were formulated together and I agree with the documentation in the resident's note. 

## 2013-11-08 ENCOUNTER — Ambulatory Visit (INDEPENDENT_AMBULATORY_CARE_PROVIDER_SITE_OTHER): Payer: Medicare HMO | Admitting: Cardiology

## 2013-11-08 ENCOUNTER — Encounter: Payer: Self-pay | Admitting: Cardiology

## 2013-11-08 VITALS — BP 140/110 | HR 74 | Ht 70.0 in | Wt 243.0 lb

## 2013-11-08 DIAGNOSIS — E782 Mixed hyperlipidemia: Secondary | ICD-10-CM

## 2013-11-08 DIAGNOSIS — R0600 Dyspnea, unspecified: Secondary | ICD-10-CM

## 2013-11-08 DIAGNOSIS — R011 Cardiac murmur, unspecified: Secondary | ICD-10-CM

## 2013-11-08 DIAGNOSIS — R079 Chest pain, unspecified: Secondary | ICD-10-CM

## 2013-11-08 DIAGNOSIS — R0609 Other forms of dyspnea: Secondary | ICD-10-CM

## 2013-11-08 DIAGNOSIS — I10 Essential (primary) hypertension: Secondary | ICD-10-CM

## 2013-11-08 HISTORY — DX: Other forms of dyspnea: R06.09

## 2013-11-08 HISTORY — DX: Dyspnea, unspecified: R06.00

## 2013-11-08 LAB — LIPID PANEL
Cholesterol: 150 mg/dL (ref 0–200)
HDL: 35.4 mg/dL — ABNORMAL LOW (ref >39.00)
LDL Cholesterol: 92 mg/dL (ref 0–99)
Total CHOL/HDL Ratio: 4
Triglycerides: 112 mg/dL (ref 0.0–149.0)
VLDL: 22.4 mg/dL (ref 0.0–40.0)

## 2013-11-08 MED ORDER — AMLODIPINE BESYLATE 2.5 MG PO TABS: 2.5000 mg | ORAL_TABLET | Freq: Every day | ORAL | Status: DC
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
## 2013-11-08 NOTE — Patient Instructions (Addendum)
Your physician has requested that you have an echocardiogram. Echocardiography is a painless test that uses sound waves to create images of your heart. It provides your doctor with information about the size and shape of your heart and how well your heart's chambers and valves are working. This procedure takes approximately one hour. There are no restrictions for this procedure.  Your physician has requested that you have en exercise stress myoview. For further information please visit [email or web address]. Please follow instruction sheet, as given.  Your physician recommends that you return for lab work in: today  Your physician has recommended you make the following change in your medication: start taking Amlodipine 2.5 mg daily  Your physician recommends that you schedule a follow-up appointment in: after tests

## 2013-11-12 ENCOUNTER — Other Ambulatory Visit: Payer: Self-pay | Admitting: *Deleted

## 2013-11-12 DIAGNOSIS — I10 Essential (primary) hypertension: Secondary | ICD-10-CM

## 2013-11-12 DIAGNOSIS — E785 Hyperlipidemia, unspecified: Secondary | ICD-10-CM

## 2013-11-12 MED ORDER — ATORVASTATIN CALCIUM 20 MG PO TABS: 20.0000 mg | ORAL_TABLET | Freq: Every day | ORAL | Status: DC

## 2013-11-14 LAB — HM DIABETES EYE EXAM

## 2013-11-22 ENCOUNTER — Ambulatory Visit (INDEPENDENT_AMBULATORY_CARE_PROVIDER_SITE_OTHER): Payer: Medicare HMO | Admitting: Internal Medicine

## 2013-11-22 ENCOUNTER — Encounter: Payer: Self-pay | Admitting: Internal Medicine

## 2013-11-22 VITALS — BP 151/92 | HR 74 | Temp 97.1°F | Wt 246.9 lb

## 2013-11-22 DIAGNOSIS — I10 Essential (primary) hypertension: Secondary | ICD-10-CM

## 2013-11-22 DIAGNOSIS — Z23 Encounter for immunization: Secondary | ICD-10-CM

## 2013-11-22 DIAGNOSIS — Z0000 Encounter for general adult medical examination without abnormal findings: Secondary | ICD-10-CM

## 2013-11-22 DIAGNOSIS — E119 Type 2 diabetes mellitus without complications: Secondary | ICD-10-CM

## 2013-11-22 MED ORDER — ZOSTER VACCINE LIVE 19400 UNT/0.65ML ~~LOC~~ SOLR: 0.6500 mL | Freq: Once | SUBCUTANEOUS | Status: DC

## 2013-11-22 NOTE — Assessment & Plan Note (Signed)
CBGs appear to be fairly well controlled. Will plan to continue metformin 1047m BID and glipizide 197mBID for now. He will try to have the records from his ophthalmologist's office sent over before his next visit.

## 2013-11-22 NOTE — Progress Notes (Signed)
Patient ID: John Hobbs, male   DOB: 09/12/1946, 68 y.o.   MRN: [id number] HPI The patient is a 67 y.o. male with a history of HTN, DM type 2 (Hgb A1c 7.5, Sept 2014), HLD who presents for a recheck of his blood pressure and diabetes.  Patient was last seen in [facility] January 6 at which time his blood pressure was slightly elevated and his A1c had deteriorated. Dr. Redmond Pulling increased patient's losartan from 50 mg daily to 100 mg daily at this visit. Patient was instructed to take this medication as well as continue his atenolol-chlorthalidone 50-12.5 mg daily. He was to followup in a month for a blood pressure recheck. In the interim, patient was seen by his cardiologist, Dr. Meda Coffee, who added amlodipine 2.5 mg daily to his antihypertensive regimen. Patient keeps a log of his daily blood pressure and blood glucose at home. His blood pressure has been running 130-140/80-90 since the addition of amlodipine. Today the patient's blood pressure 151/92, recheck 143/98.  As far as his DM type 2 goes, patient's A1c had deteriorated from 7.5 to 8.5% at his last visit in January 2015. Patient was instructed by Dr. Redmond Pulling to increase his metformin from 1000 mg daily to 1500 mg daily. Patient was also to continue taking his glipizide 10 mg twice daily. However, patient was seen by his cardiologist on January 23rd at which point he was instructed to take metformin 1000 mg twice a day. Patient has been compliant with this regimen and is currently taking metformin 1000 mg twice a day as well as glipizide 10 mg twice daily. CBGs at home have been ranging 130 to 160s since this change in his medication regimen. Patient denies having any diarrhea. He also does not complain of having any episodes of shakiness, diaphoresis, tremors. His lowest blood glucose level was 84 and he denies having any symptoms at this blood sugar level.   Of note, patient is scheduled for a cardiac stress test next week.  ROS: General: no fevers,  chills, changes in weight, changes in appetite Skin: no rash HEENT: no blurry vision, HA, sore throat Pulm: no dyspnea, coughing, wheezing CV: no chest pain, palpitations, shortness of breath Abd: no abdominal pain, nausea/vomiting, diarrhea/constipation GU: no dysuria, hematuria, polyuria Ext: no arthralgias, myalgias Neuro: no weakness, numbness, or tingling  Filed Vitals:   11/22/13 0924  BP: 151/92--recheck was 143/98  Pulse: 74  Temp: 97.1 F (36.2 C)  SpO2 99% r/a  Physical Exam General: alert, cooperative, and in no apparent distress HEENT: pupils equal round and reactive to light, vision grossly intact, oropharynx clear and non-erythematous  Neck: supple Lungs: clear to ascultation bilaterally, normal work of respiration, no wheezes, rales, ronchi Heart: regular rate and rhythm, no murmurs, gallops, or rubs Abdomen: soft, non-tender, non-distended, normal bowel sounds Extremities: warm, no pedal edema Neurologic: alert & oriented X3, cranial nerves II-XII grossly intact, strength grossly intact, sensation intact to light touch  Current Outpatient Prescriptions on File Prior to Visit  Medication Sig Dispense Refill  . amLODipine (NORVASC) 2.5 MG tablet Take 1 tablet (2.5 mg total) by mouth daily.  180 tablet  3  . aspirin 81 MG tablet Take 81 mg by mouth daily.        Marland Kitchen atenolol-chlorthalidone (TENORETIC 100) 100-25 MG per tablet Take one half tablet daily  90 tablet  2  . atorvastatin (LIPITOR) 20 MG tablet Take 1 tablet (20 mg total) by mouth daily.  90 tablet  3  . Blood  Glucose Monitoring Suppl (ONE TOUCH ULTRA SYSTEM KIT) W/DEVICE KIT 1 kit by Does not apply route once.  1 each  0  . glipiZIDE (GLUCOTROL) 10 MG tablet Take 1 tablet (10 mg total) by mouth 2 (two) times daily before a meal.  60 tablet  11  . glucose blood test strip Use as instructed  100 each  12  . losartan (COZAAR) 100 MG tablet Take 1 tablet (100 mg total) by mouth daily.  30 tablet  1  . metformin  (FORTAMET) 500 MG (OSM) 24 hr tablet Take 3 tablets (1,500 mg total) by mouth daily with breakfast.  90 tablet  1  . zoster vaccine live, PF, (ZOSTAVAX) 69629 UNT/0.65ML injection Inject 19,400 Units into the skin once.  1 each  0   No current facility-administered medications on file prior to visit.    Assessment/Plan

## 2013-11-22 NOTE — Assessment & Plan Note (Signed)
Blood pressure slightly elevated, though his home log shows BPs run slightly lower at home. We will continue the amlodipine 2.727m daily, Losartan 1074mdaily, atenolol-chlortalidone 50-12.27m727maily. He agrees to continue his log of blood pressure and will call our clinic to schedule an appointment if his blood pressure is persistently 150/90 or higher. Otherwise, he will follow up with PCP in 2-3 months.

## 2013-11-22 NOTE — Patient Instructions (Signed)
Thank you for your visit.  Please continue keeping a log of your blood glucose and your blood pressure. This is very helpful for [place] if you bring this with you to every visit.  Please call our clinic to schedule an appointment if you blood pressure is consistently greater than 150/90.  I prescribed the zostavax vaccine, which provides protection against shingles, which is a very painful rash caused by a virus. Please get this injection at your pharmacy.   Please have your eye doctor fax [place] the records of you eye exam.   You received the pneumonia vaccine today.   Please follow up with your PCP in 3 months.

## 2013-11-22 NOTE — Assessment & Plan Note (Signed)
Patient received the pneumococcal vaccine today.  He had been previously prescribed Zostavax, but never got it filled and lost the rx. Therefore, I re-prescribed this today and instructed him to have the injection at his local pharmacy when he was ready.  Patient is "overdue" in our system for an ophthalmologic exam. However, he recently had this done. I asked him to try to have his ophthalmologist's office send [place] a copy of the records. He said he will work on this.

## 2013-11-26 ENCOUNTER — Encounter: Payer: Self-pay | Admitting: Cardiology

## 2013-11-28 ENCOUNTER — Other Ambulatory Visit (INDEPENDENT_AMBULATORY_CARE_PROVIDER_SITE_OTHER): Payer: Medicare HMO

## 2013-11-28 ENCOUNTER — Ambulatory Visit (HOSPITAL_COMMUNITY): Payer: Medicare HMO | Attending: Cardiology | Admitting: Radiology

## 2013-11-28 ENCOUNTER — Ambulatory Visit (HOSPITAL_BASED_OUTPATIENT_CLINIC_OR_DEPARTMENT_OTHER): Payer: Medicare HMO | Admitting: Radiology

## 2013-11-28 VITALS — BP 154/104 | HR 75 | Ht 70.0 in | Wt 246.0 lb

## 2013-11-28 DIAGNOSIS — R0602 Shortness of breath: Secondary | ICD-10-CM

## 2013-11-28 DIAGNOSIS — R0989 Other specified symptoms and signs involving the circulatory and respiratory systems: Secondary | ICD-10-CM | POA: Insufficient documentation

## 2013-11-28 DIAGNOSIS — R9431 Abnormal electrocardiogram [ECG] [EKG]: Secondary | ICD-10-CM

## 2013-11-28 DIAGNOSIS — F1410 Cocaine abuse, uncomplicated: Secondary | ICD-10-CM | POA: Insufficient documentation

## 2013-11-28 DIAGNOSIS — F1010 Alcohol abuse, uncomplicated: Secondary | ICD-10-CM | POA: Insufficient documentation

## 2013-11-28 DIAGNOSIS — R079 Chest pain, unspecified: Secondary | ICD-10-CM | POA: Insufficient documentation

## 2013-11-28 DIAGNOSIS — R0609 Other forms of dyspnea: Secondary | ICD-10-CM | POA: Insufficient documentation

## 2013-11-28 DIAGNOSIS — R072 Precordial pain: Secondary | ICD-10-CM

## 2013-11-28 DIAGNOSIS — I079 Rheumatic tricuspid valve disease, unspecified: Secondary | ICD-10-CM | POA: Insufficient documentation

## 2013-11-28 DIAGNOSIS — R0789 Other chest pain: Secondary | ICD-10-CM

## 2013-11-28 DIAGNOSIS — I10 Essential (primary) hypertension: Secondary | ICD-10-CM | POA: Insufficient documentation

## 2013-11-28 DIAGNOSIS — E119 Type 2 diabetes mellitus without complications: Secondary | ICD-10-CM | POA: Insufficient documentation

## 2013-11-28 DIAGNOSIS — R011 Cardiac murmur, unspecified: Secondary | ICD-10-CM

## 2013-11-28 DIAGNOSIS — E669 Obesity, unspecified: Secondary | ICD-10-CM | POA: Insufficient documentation

## 2013-11-28 DIAGNOSIS — E785 Hyperlipidemia, unspecified: Secondary | ICD-10-CM

## 2013-11-28 LAB — COMPREHENSIVE METABOLIC PANEL
ALT: 22 U/L (ref 0–53)
AST: 23 U/L (ref 0–37)
Albumin: 3.9 g/dL (ref 3.5–5.2)
Alkaline Phosphatase: 32 U/L — ABNORMAL LOW (ref 39–117)
BUN: 11 mg/dL (ref 6–23)
CO2: 26 mEq/L (ref 19–32)
Calcium: 9.1 mg/dL (ref 8.4–10.5)
Chloride: 105 mEq/L (ref 96–112)
Creatinine, Ser: 0.9 mg/dL (ref 0.4–1.5)
GFR: 106.13 mL/min (ref >60.00)
Glucose, Bld: 196 mg/dL — ABNORMAL HIGH (ref 70–99)
Potassium: 3.3 mEq/L — ABNORMAL LOW (ref 3.5–5.1)
Sodium: 139 mEq/L (ref 135–145)
Total Bilirubin: 0.7 mg/dL (ref 0.3–1.2)
Total Protein: 7.4 g/dL (ref 6.0–8.3)

## 2013-11-28 MED ORDER — TECHNETIUM TC 99M SESTAMIBI GENERIC - CARDIOLITE
10.8000 | Freq: Once | INTRAVENOUS | Status: AC | PRN
  Administered 2013-11-28: 11 via INTRAVENOUS

## 2013-11-28 MED ORDER — REGADENOSON 0.4 MG/5ML IV SOLN
0.4000 mg | Freq: Once | INTRAVENOUS | Status: AC
  Administered 2013-11-28: 0.4 mg via INTRAVENOUS

## 2013-11-28 MED ORDER — TECHNETIUM TC 99M SESTAMIBI GENERIC - CARDIOLITE
33.0000 | Freq: Once | INTRAVENOUS | Status: AC | PRN
  Administered 2013-11-28: 33 via INTRAVENOUS

## 2013-11-28 NOTE — Progress Notes (Signed)
[facility] [address] [place], [place] [address] [phone]    Cardiology Nuclear Med Study  John Hobbs is a 68 y.o. male     MRN : [id number]     DOB: [birth year]  Procedure Date: 11/28/2013  Nuclear Med Background Indication for Stress Test:  Evaluation for Ischemia History:  No known CAD, Echo (pending results), hx cocaine 2004 Cardiac Risk Factors: Family History - CAD, Hypertension, Lipids and NIDDM  Symptoms:  Chest Pain (last date of chest discomfort was one month ago) and DOE   Nuclear Pre-Procedure Caffeine/Decaff Intake:  None NPO After: 7:00pm   Lungs:  clear O2 Sat: 91% on room air. IV 0.9% NS with Angio Cath:  20g  IV Site: R Hand  IV Started by:  Matilde Haymaker, RN  Chest Size (in):  48 Cup Size: n/a  Height: 5' 10"  (1.778 m)  Weight:  246 lb (111.585 kg)  BMI:  Body mass index is 35.3 kg/(m^2). Tech Comments:  No Tenoretic x 24 hrs.    Nuclear Med Study 1 or 2 day study: 1 day  Stress Test Type:  Treadmill/Lexiscan  Reading MD: n/a  Order Authorizing Provider:  Filiberto Pinks  Resting Radionuclide: Technetium 75mSestamibi  Resting Radionuclide Dose: 11.0 mCi   Stress Radionuclide:  Technetium 933mestamibi  Stress Radionuclide Dose: 33.0 mCi           Stress Protocol Rest HR: 75 Stress HR: 136  Rest BP: 154/104 Stress BP: 173/114  Exercise Time (min): n/a METS: n/a           Dose of Adenosine (mg):  n/a Dose of Lexiscan: 0.4 mg  Dose of Atropine (mg): n/a Dose of Dobutamine: n/a mcg/kg/min (at max HR)  Stress Test Technologist: ShGlade LloydBS-ES  Nuclear Technologist:  ElVedia PereyraCNMT     Rest Procedure:  Myocardial perfusion imaging was performed at rest 45 minutes following the intravenous administration of Technetium 9926mstamibi. Rest ECG: NSR - Normal EKG  Stress Procedure:  The patient received IV Lexiscan 0.4 mg over 15-seconds with concurrent low level exercise and then  Technetium 5m65mtamibi was injected at 30-seconds while the patient continued walking one more minute.  Quantitative spect images were obtained after a 45-minute delay.  During the infusion of Lexiscan, the patient complained of fatigue and a headache.  These symptoms began to resolve in recovery.  Stress ECG: transient subtle downsloping ST segments in the inferolateral leads with normal J point  QPS Raw Data Images:  Mild diaphragmatic attenuation.  Normal left ventricular size. Stress Images:  There is decreased uptake in the inferior wall. Rest Images:  There is decreased uptake in the inferior wall. Subtraction (SDS):  There is a fixed inferior defect that is most consistent with diaphragmatic attenuation. Transient Ischemic Dilatation (Normal <1.22):  0.84 Lung/Heart Ratio (Normal <0.45):  0.24  Quantitative Gated Spect Images QGS EDV:  83 ml QGS ESV:  28 ml  Impression Exercise Capacity:  Lexiscan with low level exercise. BP Response:  marked elevation in BP at baseline of 205/118mm19md study changed to low level Lexiscan but diastolic BP remained markedly elevated Clinical Symptoms:  fatigue ECG Impression:  No significant ST segment change with adenosine. Comparison with Prior Nuclear Study: No previous nuclear study performed  Overall Impression:  Low risk stress nuclear study fixed defect in the inferior wall consistent with diaphragmatic attenuation.  LV Ejection Fraction: 66%.  LV Wall Motion:  NL  LV Function; NL Wall Motion  Signed: Fransico Him, MD 11/28/2013

## 2013-11-28 NOTE — Progress Notes (Signed)
Echo performed. 

## 2013-12-02 ENCOUNTER — Other Ambulatory Visit: Payer: Self-pay

## 2013-12-02 MED ORDER — POTASSIUM CHLORIDE ER 10 MEQ PO TBCR: 10.0000 meq | EXTENDED_RELEASE_TABLET | Freq: Every day | ORAL | Status: DC

## 2013-12-02 MED ORDER — AMLODIPINE BESYLATE 5 MG PO TABS: 5.0000 mg | ORAL_TABLET | Freq: Every day | ORAL | Status: DC

## 2013-12-03 ENCOUNTER — Ambulatory Visit: Payer: BC Managed Care – PPO | Admitting: Cardiology

## 2013-12-09 ENCOUNTER — Other Ambulatory Visit: Payer: Self-pay | Admitting: *Deleted

## 2013-12-09 MED ORDER — METFORMIN HCL ER (OSM) 1000 MG PO TB24: 2000.0000 mg | ORAL_TABLET | Freq: Every day | ORAL | Status: DC

## 2013-12-19 ENCOUNTER — Encounter: Payer: Self-pay | Admitting: Cardiology

## 2013-12-19 ENCOUNTER — Ambulatory Visit (INDEPENDENT_AMBULATORY_CARE_PROVIDER_SITE_OTHER): Payer: Medicare HMO | Admitting: Cardiology

## 2013-12-19 ENCOUNTER — Encounter (INDEPENDENT_AMBULATORY_CARE_PROVIDER_SITE_OTHER): Payer: Self-pay

## 2013-12-19 VITALS — BP 150/100 | HR 92 | Ht 70.0 in | Wt 249.0 lb

## 2013-12-19 DIAGNOSIS — R079 Chest pain, unspecified: Secondary | ICD-10-CM

## 2013-12-19 LAB — BASIC METABOLIC PANEL
BUN: 12 mg/dL (ref 6–23)
CO2: 27 mEq/L (ref 19–32)
Calcium: 9.5 mg/dL (ref 8.4–10.5)
Chloride: 99 mEq/L (ref 96–112)
Creatinine, Ser: 1 mg/dL (ref 0.4–1.5)
GFR: 97.5 mL/min (ref >60.00)
Glucose, Bld: 291 mg/dL — ABNORMAL HIGH (ref 70–99)
Potassium: 3.5 mEq/L (ref 3.5–5.1)
Sodium: 135 mEq/L (ref 135–145)

## 2013-12-19 NOTE — Patient Instructions (Signed)
Your physician recommends that you continue on your current medications as directed. Please refer to the Current Medication list given to you today.  Your physician recommends that you return for lab work in: today  Your physician wants you to follow-up in: 6 months. You will receive a reminder letter in the mail two months in advance. If you don't receive a letter, please call our office to schedule the follow-up appointment.
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
## 2013-12-23 ENCOUNTER — Other Ambulatory Visit: Payer: Self-pay | Admitting: Internal Medicine

## 2013-12-23 NOTE — Telephone Encounter (Signed)
Has F/U appt this month

## 2013-12-30 ENCOUNTER — Encounter: Payer: Self-pay | Admitting: Family Medicine

## 2013-12-30 ENCOUNTER — Ambulatory Visit (INDEPENDENT_AMBULATORY_CARE_PROVIDER_SITE_OTHER): Payer: Medicare HMO | Admitting: Family Medicine

## 2013-12-30 VITALS — BP 118/86 | Temp 98.3°F | Ht 70.0 in | Wt 241.0 lb

## 2013-12-30 DIAGNOSIS — E1165 Type 2 diabetes mellitus with hyperglycemia: Principal | ICD-10-CM

## 2013-12-30 DIAGNOSIS — E1149 Type 2 diabetes mellitus with other diabetic neurological complication: Secondary | ICD-10-CM | POA: Insufficient documentation

## 2013-12-30 DIAGNOSIS — E876 Hypokalemia: Secondary | ICD-10-CM

## 2013-12-30 DIAGNOSIS — E119 Type 2 diabetes mellitus without complications: Secondary | ICD-10-CM | POA: Insufficient documentation

## 2013-12-30 DIAGNOSIS — IMO0002 Reserved for concepts with insufficient information to code with codable children: Secondary | ICD-10-CM | POA: Insufficient documentation

## 2013-12-30 NOTE — Patient Instructions (Addendum)
-PLEASE SIGN UP FOR MYCHART TODAY   We recommend the following healthy lifestyle measures: - eat a healthy diet consisting of lots of vegetables, fruits, beans, nuts, seeds, healthy meats such as white chicken and fish and whole grains.  - avoid fried foods, fast food, processed foods, sodas, red meet and other fattening foods.  - get a least 150 minutes of aerobic exercise per week.   You may find this book helpful for your diabetes. It is available on [place] and is fairly inexpensive.  Dr. Janene Harvey Program for Reversing Diabetes: The Scientifically Proven System for Reversing Diabetes without Drugs  http://www.nealbarnard.org/index.cfm  See the the nutrition and diabetes educator  Follow up in: 2 months      Diabetes and Exercise Exercising regularly, AT LEAST [place] is important. It is not just about losing weight. It has many health benefits, such as:  Improving your overall fitness, flexibility, and endurance.  Increasing your bone density.  Helping with weight control.  Decreasing your body fat.  Increasing your muscle strength.  Reducing stress and tension.  Improving your overall health. People with diabetes who exercise gain additional benefits because exercise:  Reduces appetite.  Improves the body's use of blood sugar (glucose).  Helps lower or control blood glucose.  Decreases blood pressure.  Helps control blood lipids (such as cholesterol and triglycerides).  Improves the body's use of the hormone insulin by:  Increasing the body's insulin sensitivity.  Reducing the body's insulin needs.  Decreases the risk for heart disease because exercising:  Lowers cholesterol and triglycerides levels.  Increases the levels of good cholesterol (such as high-density lipoproteins [HDL]) in the body.  Lowers blood glucose levels. YOUR ACTIVITY PLAN  Choose an activity that you enjoy and set realistic goals. Your health care provider or  diabetes educator can help you make an activity plan that works for you. You can break activities into 2 or 3 sessions throughout the day. Doing so is as good as one long session. Exercise ideas include:  Taking the dog for a walk.  Taking the stairs instead of the elevator.  Dancing to your favorite song.  Doing your favorite exercise with a friend. RECOMMENDATIONS FOR EXERCISING WITH TYPE 1 OR TYPE 2 DIABETES    Avoid injecting insulin into areas of the body that are going to be exercised. For example, avoid injecting insulin into:  The arms when playing tennis.  The legs when jogging.  Keep a record of:  Food intake before and after you exercise.  Expected peak times of insulin action.  Blood glucose levels before and after you exercise.  The type and amount of exercise you have done.  Review your records with your health care provider. Your health care provider will help you to develop guidelines for adjusting food intake and insulin amounts before and after exercising.  If you take insulin or oral hypoglycemic agents, watch for signs and symptoms of hypoglycemia. They include:  Dizziness.  Shaking.  Sweating.  Chills.  Confusion.  Drink plenty of water while you exercise to prevent dehydration or heat stroke. Body water is lost during exercise and must be replaced.  Talk to your health care provider before starting an exercise program to make sure it is safe for you. Remember, almost any type of activity is better than none. Document Released: 12/24/2003 Document Revised: 06/05/2013 Document Reviewed: 03/12/2013 [facility] Patient Information 2014 [place].   Diabetes Meal Planning Guide The diabetes meal planning guide is a  tool to help you plan your meals and snacks. It is important for people with diabetes to manage their blood glucose (sugar) levels. Choosing the right foods and the right amounts throughout your day will help control your blood glucose.  Eating right can even help you improve your blood pressure and reach or maintain a healthy weight. CARBOHYDRATE COUNTING MADE EASY When you eat carbohydrates, they turn to sugar. This raises your blood glucose level. Counting carbohydrates can help you control this level so you feel better. When you plan your meals by counting carbohydrates, you can have more flexibility in what you eat and balance your medicine with your food intake. Carbohydrate counting simply means adding up the total amount of carbohydrate grams in your meals and snacks. Try to eat about the same amount at each meal. Foods with carbohydrates are listed below. Each portion below is 1 carbohydrate serving or 15 grams of carbohydrates. Ask your dietician how many grams of carbohydrates you should eat at each meal or snack. Grains and Starches  1 slice bread.   English muffin or hotdog/hamburger bun.   cup cold cereal (unsweetened).   cup cooked pasta or rice.   cup starchy vegetables (corn, potatoes, peas, beans, winter squash).  1 tortilla (6 inches).   bagel.  1 waffle or pancake (size of a CD).   cup cooked cereal.  4 to 6 small crackers. *Whole grain is recommended. Fruit  1 cup fresh unsweetened berries, melon, papaya, pineapple.  1 small fresh fruit.   banana or mango.   cup fruit juice (4 oz unsweetened).   cup canned fruit in natural juice or water.  2 tbs dried fruit.  12 to 15 grapes or cherries. Milk and Yogurt  1 cup fat-free or 1% milk.  1 cup soy milk.  6 oz light yogurt with sugar-free sweetener.  6 oz low-fat soy yogurt.  6 oz plain yogurt. Vegetables  1 cup raw or  cup cooked is counted as 0 carbohydrates or a "free" food.  If you eat 3 or more servings at 1 meal, count them as 1 carbohydrate serving. Other Carbohydrates   oz chips or pretzels.   cup ice cream or frozen yogurt.   cup sherbet or sorbet.  2 inch square cake, no frosting.  1 tbs honey,  sugar, jam, jelly, or syrup.  2 small cookies.  3 squares of graham crackers.  3 cups popcorn.  6 crackers.  1 cup broth-based soup.  Count 1 cup casserole or other mixed foods as 2 carbohydrate servings.  Foods with less than 20 calories in a serving may be counted as 0 carbohydrates or a "free" food. You may want to purchase a book or computer software that lists the carbohydrate gram counts of different foods. In addition, the nutrition facts panel on the labels of the foods you eat are a good source of this information. The label will tell you how big the serving size is and the total number of carbohydrate grams you will be eating per serving. Divide this number by 15 to obtain the number of carbohydrate servings in a portion. Remember, 1 carbohydrate serving equals 15 grams of carbohydrate. SERVING SIZES Measuring foods and serving sizes helps you make sure you are getting the right amount of food. The list below tells how big or small some common serving sizes are.  1 oz.........4 stacked dice.  3 oz........Marland KitchenDeck of cards.  1 tsp.......Marland KitchenTip of little finger.  1 tbs......Marland KitchenMarland KitchenThumb.  2 tbs.......Marland KitchenGolf ball.  cup......Marland KitchenHalf of a fist.  1 cup.......Marland KitchenA fist. SAMPLE DIABETES MEAL PLAN Below is a sample meal plan that includes foods from the grain and starches, dairy, vegetable, fruit, and meat groups. A dietician can individualize a meal plan to fit your calorie needs and tell you the number of servings needed from each food group. However, controlling the total amount of carbohydrates in your meal or snack is more important than making sure you include all of the food groups at every meal. You may interchange carbohydrate containing foods (dairy, starches, and fruits). The meal plan below is an example of a 2000 calorie diet using carbohydrate counting. This meal plan has 17 carbohydrate servings. Breakfast  1 cup oatmeal (2 carb servings).   cup light yogurt (1 carb  serving).  1 cup blueberries (1 carb serving).   cup almonds. Snack  1 large apple (2 carb servings).  1 low-fat string cheese stick. Lunch  Chicken breast salad.  1 cup spinach.   cup chopped tomatoes.  2 oz chicken breast, sliced.  2 tbs low-fat [place] dressing.  12 whole-wheat crackers (2 carb servings).  12 to 15 grapes (1 carb serving).  1 cup low-fat milk (1 carb serving). Snack  1 cup carrots.   cup hummus (1 carb serving). Dinner  3 oz broiled salmon.  1 cup brown rice (3 carb servings). Snack  1  cups steamed broccoli (1 carb serving) drizzled with 1 tsp olive oil and lemon juice.  1 cup light pudding (2 carb servings). DIABETES MEAL PLANNING WORKSHEET Your dietician can use this worksheet to help you decide how many servings of foods and what types of foods are right for you.  BREAKFAST Food Group and Servings / Carb Servings Grain/Starches __________________________________ Dairy __________________________________________ Vegetable ______________________________________ Fruit ___________________________________________ Meat __________________________________________ Fat ____________________________________________ LUNCH Food Group and Servings / Carb Servings Grain/Starches ___________________________________ Dairy ___________________________________________ Fruit ____________________________________________ Meat ___________________________________________ Fat _____________________________________________ W[employer] Food Group and Servings / Carb Servings Grain/Starches ___________________________________ Dairy ___________________________________________ Fruit ____________________________________________ Meat ___________________________________________ Fat _____________________________________________ SNACKS Food Group and Servings / Carb Servings Grain/Starches ___________________________________ Dairy  ___________________________________________ Vegetable _______________________________________ Fruit ____________________________________________ Meat ___________________________________________ Fat _____________________________________________ DAILY TOTALS Starches _________________________ Vegetable ________________________ Fruit ____________________________ Dairy ____________________________ Meat ____________________________ Fat ______________________________ Document Released: 06/30/2005 Document Revised: 12/26/2011 Document Reviewed: 05/11/2009 [employer] Patient Information 2014 [employer].
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
## 2013-12-30 NOTE — Progress Notes (Signed)
Pre visit review using our clinic review tool, if applicable. No additional management support is needed unless otherwise documented below in the visit note. 

## 2014-01-03 ENCOUNTER — Telehealth: Payer: Self-pay

## 2014-01-03 NOTE — Telephone Encounter (Signed)
Relevant patient education mailed to patient.  

## 2014-01-06 ENCOUNTER — Telehealth: Payer: Self-pay | Admitting: Family Medicine

## 2014-01-06 MED ORDER — ONETOUCH LANCETS MISC: Status: DC

## 2014-01-06 NOTE — Telephone Encounter (Signed)
Pt's daughter is calling stating pt needs new rx glucose blood test strip, sent to [facility] on [address].

## 2014-01-06 NOTE — Telephone Encounter (Signed)
Rx sent to pharmacy   

## 2014-01-30 ENCOUNTER — Other Ambulatory Visit: Payer: Self-pay | Admitting: Family Medicine

## 2014-03-06 ENCOUNTER — Encounter: Payer: Medicare HMO | Admitting: Internal Medicine

## 2014-03-26 ENCOUNTER — Telehealth: Payer: Self-pay | Admitting: Family Medicine

## 2014-03-26 MED ORDER — ONETOUCH LANCETS MISC: Status: AC

## 2014-03-26 MED ORDER — GLUCOSE BLOOD VI STRP: ORAL_STRIP | Status: DC

## 2014-03-26 NOTE — Telephone Encounter (Signed)
Pt needs new rxs one touch verio test strips and lancets call into [facility] on [address]

## 2014-04-01 ENCOUNTER — Other Ambulatory Visit: Payer: Self-pay | Admitting: *Deleted

## 2014-04-01 NOTE — Telephone Encounter (Signed)
It looks like [name] has established care with Dr. Maudie Mercury, I am not sure why he has a follow up appointment with me if that is the case, could you confirm with the patient the he will now be seeing Dr. Maudie Mercury?  If so that follow up appointment could be canceled and this refill request could go to Dr. Maudie Mercury

## 2014-04-02 NOTE — Telephone Encounter (Signed)
Talked with pt - pt states he is now seeing Dr Maudie Mercury as PCP. Pharmacy aware to send request on med to Dr Maudie Mercury.

## 2014-04-02 NOTE — Telephone Encounter (Signed)
Talked with pt on 04/02/14 4:30PM - states he is now seeing Dr Maudie Mercury as PCP. Request for refill on med sent back to pharmacy - send to Dr Maudie Mercury. Hilda Blades Ditzler RN 04/02/14 4:30PM

## 2014-04-03 ENCOUNTER — Ambulatory Visit (INDEPENDENT_AMBULATORY_CARE_PROVIDER_SITE_OTHER): Payer: Medicare HMO | Admitting: Family Medicine

## 2014-04-03 ENCOUNTER — Encounter: Payer: Self-pay | Admitting: Family Medicine

## 2014-04-03 VITALS — BP 140/90 | HR 64 | Temp 98.4°F | Ht 70.0 in | Wt 243.5 lb

## 2014-04-03 DIAGNOSIS — E1165 Type 2 diabetes mellitus with hyperglycemia: Principal | ICD-10-CM

## 2014-04-03 DIAGNOSIS — IMO0002 Reserved for concepts with insufficient information to code with codable children: Secondary | ICD-10-CM

## 2014-04-03 DIAGNOSIS — J301 Allergic rhinitis due to pollen: Secondary | ICD-10-CM

## 2014-04-03 DIAGNOSIS — E1149 Type 2 diabetes mellitus with other diabetic neurological complication: Secondary | ICD-10-CM

## 2014-04-03 DIAGNOSIS — I10 Essential (primary) hypertension: Secondary | ICD-10-CM

## 2014-04-03 DIAGNOSIS — E785 Hyperlipidemia, unspecified: Secondary | ICD-10-CM

## 2014-04-03 LAB — BASIC METABOLIC PANEL
BUN: 12 mg/dL (ref 6–23)
CO2: 27 mEq/L (ref 19–32)
CREATININE: 0.9 mg/dL (ref 0.4–1.5)
Calcium: 9.5 mg/dL (ref 8.4–10.5)
Chloride: 102 mEq/L (ref 96–112)
GFR: 103.41 mL/min (ref >60.00)
GLUCOSE: 198 mg/dL — AB (ref 70–99)
Potassium: 3.4 mEq/L — ABNORMAL LOW (ref 3.5–5.1)
Sodium: 137 mEq/L (ref 135–145)

## 2014-04-03 LAB — MICROALBUMIN / CREATININE URINE RATIO
Creatinine,U: 124.5 mg/dL
Microalb Creat Ratio: 0.3 mg/g (ref 0.0–30.0)
Microalb, Ur: 0.4 mg/dL (ref 0.0–1.9)

## 2014-04-03 LAB — HEMOGLOBIN A1C: Hgb A1c MFr Bld: 8.8 % — ABNORMAL HIGH (ref 4.6–6.5)

## 2014-04-03 LAB — LIPID PANEL
CHOL/HDL RATIO: 4
CHOLESTEROL: 123 mg/dL (ref 0–200)
HDL: 33.5 mg/dL — ABNORMAL LOW (ref >39.00)
LDL CALC: 57 mg/dL (ref 0–99)
NonHDL: 89.5
Triglycerides: 164 mg/dL — ABNORMAL HIGH (ref 0.0–149.0)
VLDL: 32.8 mg/dL (ref 0.0–40.0)
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
## 2014-04-03 NOTE — Patient Instructions (Signed)
-  call to schedule follow up with your cardiologist about your blood pressure - use pill box and take all medications every day  -call to schedule appointment with diabetes educator [place]  [place] [address]  [place],  [address]  Phone: [phone]  Take ALL diabetes medications every day  Low carb diet and regular exercise  For allergies take claritin daily and nasocort daily for 1 month  FOLLOW UP IN ONE MONTH

## 2014-04-03 NOTE — Progress Notes (Signed)
Pre visit review using our clinic review tool, if applicable. No additional management support is needed unless otherwise documented below in the visit note. 

## 2014-04-04 ENCOUNTER — Encounter: Payer: Self-pay | Admitting: *Deleted

## 2014-04-04 ENCOUNTER — Other Ambulatory Visit: Payer: Self-pay | Admitting: *Deleted

## 2014-04-04 ENCOUNTER — Telehealth: Payer: Self-pay | Admitting: *Deleted

## 2014-04-04 DIAGNOSIS — I10 Essential (primary) hypertension: Secondary | ICD-10-CM

## 2014-04-04 MED ORDER — ATORVASTATIN CALCIUM 20 MG PO TABS: 20.0000 mg | ORAL_TABLET | Freq: Every day | ORAL | Status: DC

## 2014-04-04 MED ORDER — GLIPIZIDE 10 MG PO TABS
10.0000 mg | ORAL_TABLET | Freq: Two times a day (BID) | ORAL | Status: DC
Start: 2014-04-04 — End: 2015-02-18

## 2014-04-04 MED ORDER — AMLODIPINE BESYLATE 5 MG PO TABS: 5.0000 mg | ORAL_TABLET | Freq: Every day | ORAL | Status: DC

## 2014-04-04 MED ORDER — ATENOLOL-CHLORTHALIDONE 100-25 MG PO TABS: ORAL_TABLET | ORAL | Status: DC

## 2014-04-04 MED ORDER — LOSARTAN POTASSIUM 100 MG PO TABS
100.0000 mg | ORAL_TABLET | Freq: Every day | ORAL | Status: DC
Start: 2014-04-04 — End: 2015-01-22

## 2014-04-04 NOTE — Telephone Encounter (Signed)
I called the pt and advised him per Dr Maudie Mercury there are a number of medications she may add for the diabetes , a medication will not be called in at this time and she will discuss this at his next office visit in one month and he agreed.

## 2014-04-10 ENCOUNTER — Encounter: Payer: Medicare HMO | Admitting: Internal Medicine

## 2014-05-22 ENCOUNTER — Ambulatory Visit: Payer: Medicare HMO | Admitting: *Deleted

## 2014-05-23 ENCOUNTER — Encounter: Payer: Medicare HMO | Attending: Family Medicine | Admitting: *Deleted

## 2014-05-23 ENCOUNTER — Encounter: Payer: Self-pay | Admitting: *Deleted

## 2014-05-23 VITALS — Ht 70.0 in | Wt 239.1 lb

## 2014-05-23 DIAGNOSIS — Z713 Dietary counseling and surveillance: Secondary | ICD-10-CM | POA: Insufficient documentation

## 2014-05-23 DIAGNOSIS — E1165 Type 2 diabetes mellitus with hyperglycemia: Secondary | ICD-10-CM | POA: Diagnosis present

## 2014-05-23 DIAGNOSIS — IMO0002 Reserved for concepts with insufficient information to code with codable children: Secondary | ICD-10-CM | POA: Diagnosis present

## 2014-05-23 DIAGNOSIS — E118 Type 2 diabetes mellitus with unspecified complications: Principal | ICD-10-CM

## 2014-05-23 DIAGNOSIS — E1149 Type 2 diabetes mellitus with other diabetic neurological complication: Secondary | ICD-10-CM

## 2014-05-23 NOTE — Patient Instructions (Signed)
Plan:  Aim for 4 Carb Choices per meal (60 grams) +/- 1 either way  Aim for 0-2 Carbs per snack if hungry  Include protein in moderation with your meals and snacks Consider reading food labels for Total Carbohydrate of foods Continue with your activity level by walking for 2 miles as tolerated Consider checking BG at alternate times per day as directed by MD

## 2014-05-23 NOTE — Progress Notes (Signed)
Appt start time: 0930 end time:  1100.  Assessment:  Patient was seen on  05/23/14 for individual diabetes education. Lives with wife, she shops and cooks the food.  He is retired from [employer]. He gets out of the house daily. He walks about 2 miles every day around the neighborhood 3-4 times a week. SMBG in AM, with reported range of 117-180 pre breakfast. He ran out of his allergy medicine so he is having problems with his allergies right now. He has had hypoglycemia in the past, not lately.   Patient Education Plan per assessed needs and concerns is to attend individual session for Diabetes Self Management Education.  Current HbA1c: 8.8%  Preferred Learning Style:   No preference indicated   Learning Readiness:   Ready  Change in progress  MEDICATIONS: see list, diabetes medications are Metformin and Glipizide  DIETARY INTAKE:  24-hr recall:  B ( AM): corn flakes with 2% milk, occasionally with fruit, water  Snk ( AM): occasionally PNB sandwich or PNB crackers OR fresh fruit L ( PM): varies - sandwich at home occasionally with chips, but not often, water Snk ( PM): same as AM D ( PM): meat, limits starch servings, vegetables, salad, water Snk ( PM): occasionally PNB sandwich or PNB crackers Beverages: milk, water  Usual physical activity: walks 2 miles about 3-4 times a week  Estimated energy needs: 1600 calories 180 g carbohydrates 120 g protein 44 g fat  Progress Towards Goal(s):  In progress.   Nutritional Diagnosis:  NB-1.1 Food and nutrition-related knowledge deficit As related to Diabetes.  As evidenced by A1c of 8.8%.    Intervention:  Nutrition counseling provided.  Discussed diabetes disease process and treatment options.  Discussed physiology of diabetes and role of obesity on insulin resistance.  Encouraged moderate weight reduction to improve glucose levels.   Provided education on macronutrients on glucose levels.  Provided  education on carb counting, importance of regularly scheduled meals/snacks, and meal planning  Discussed effects of physical activity on glucose levels and long-term glucose control.  Recommended he continue with his current physical activity/week.  Reviewed patient medications.  Discussed role of medication on blood glucose and possible side effects  Discussed blood glucose monitoring and interpretation.  Discussed recommended target ranges and individual ranges.    Described short-term complications: hyper- and hypo-glycemia.  Discussed causes,symptoms, and treatment options.  Discussed prevention, detection, and treatment of long-term complications.  Discussed the role of prolonged elevated glucose levels on body systems.  Discussed role of stress on blood glucose levels and discussed strategies to manage psychosocial issues.  Discussed recommendations for long-term diabetes self-care.  Provided checklist for medical, dental, and emotional self-care.  Plan:  Aim for 4 Carb Choices per meal (60 grams) +/- 1 either way  Aim for 0-2 Carbs per snack if hungry  Include protein in moderation with your meals and snacks Consider reading food labels for Total Carbohydrate of foods Continue with your activity level by walking for 2 miles as tolerated Consider checking BG at alternate times per day as directed by MD   Teaching Method Utilized: Visual, Auditory and Hands on  Handouts given during visit include: Living Well with Diabetes Carb Counting and Food Label handouts Meal Plan Card  Barriers to learning/adherence to lifestyle change: none  Diabetes self-care support plan:   [facility] support group  Demonstrated degree of understanding via:  Teach Back   Monitoring/Evaluation:  Dietary intake, exercise, reading food labels, and body  weight prn.

## 2014-07-04 ENCOUNTER — Encounter: Payer: Self-pay | Admitting: Family Medicine

## 2014-07-04 ENCOUNTER — Ambulatory Visit (INDEPENDENT_AMBULATORY_CARE_PROVIDER_SITE_OTHER): Payer: Medicare HMO | Admitting: Family Medicine

## 2014-07-04 VITALS — BP 120/78 | HR 75 | Temp 97.6°F | Ht 70.0 in | Wt 240.0 lb

## 2014-07-04 DIAGNOSIS — Z23 Encounter for immunization: Secondary | ICD-10-CM

## 2014-07-04 DIAGNOSIS — M25561 Pain in right knee: Secondary | ICD-10-CM

## 2014-07-04 DIAGNOSIS — R7401 Elevation of levels of liver transaminase levels: Secondary | ICD-10-CM

## 2014-07-04 DIAGNOSIS — E785 Hyperlipidemia, unspecified: Secondary | ICD-10-CM

## 2014-07-04 DIAGNOSIS — E1165 Type 2 diabetes mellitus with hyperglycemia: Principal | ICD-10-CM

## 2014-07-04 DIAGNOSIS — E1149 Type 2 diabetes mellitus with other diabetic neurological complication: Secondary | ICD-10-CM

## 2014-07-04 DIAGNOSIS — R740 Nonspecific elevation of levels of transaminase and lactic acid dehydrogenase [LDH]: Secondary | ICD-10-CM

## 2014-07-04 DIAGNOSIS — IMO0002 Reserved for concepts with insufficient information to code with codable children: Secondary | ICD-10-CM

## 2014-07-04 DIAGNOSIS — R7402 Elevation of levels of lactic acid dehydrogenase (LDH): Secondary | ICD-10-CM

## 2014-07-04 DIAGNOSIS — M25569 Pain in unspecified knee: Secondary | ICD-10-CM

## 2014-07-04 DIAGNOSIS — I10 Essential (primary) hypertension: Secondary | ICD-10-CM

## 2014-07-04 DIAGNOSIS — E876 Hypokalemia: Secondary | ICD-10-CM

## 2014-07-04 MED ORDER — METFORMIN HCL ER (OSM) 1000 MG PO TB24: 2000.0000 mg | ORAL_TABLET | Freq: Every day | ORAL | Status: DC

## 2014-07-04 NOTE — Patient Instructions (Addendum)
-  We have ordered labs or studies at this visit. It can take up to 1-2 weeks for results and processing. We will contact you with instructions IF your results are abnormal. Normal results will be released to your [facility]. If you have not heard from [place] or can not find your results in [facility] in 2 weeks please contact our office.  SET UP LAB APPOINTMENT FOR FASTING LABS and make sure to keep this appointment, drink plenty of water but do not eat for 8 hours before your labs - we may be changing your diabetes medications after lab work as we discussed.  FOR YOUR DIABETES:  []  Eat a healthy low carb diet (avoid sweets, sweet drinks, breads, potatoes, rice, etc.) and ensure 3 small meals daily.  []  Get AT LEAST 150 minutes of cardiovascular exercise per week - 30 minutes per day is best of sustained sweaty exercise.  []  Take all of your medications every day as directed by your doctor. Call your doctor immediately if you have any questions about your medications or are running low.  []  Check your blood sugar often and when you feel unwell and keep a log to bring to all health appointments. FASTING: before you eat anything in the morning POSTPRANDIAL: 1-2 hours after a meal  []  If any low blood sugars < 70, eat a snack and call your doctor immediately.  []  See an eye doctor every year and fax your diabetic eye exam to our office.  Fax: [phone]  []  Take good care of your feet and keep them soft and callus free. Check your feet daily and wear comfortable shoes. See your doctor immediately if you have any cuts, calluses or wounds on your feet.

## 2014-07-04 NOTE — Addendum Note (Signed)
Addended by: Agnes Lawrence on: 07/04/2014 11:45 AM   Modules accepted: Orders

## 2014-07-04 NOTE — Progress Notes (Signed)
Pre visit review using our clinic review tool, if applicable. No additional management support is needed unless otherwise documented below in the visit note. 
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
## 2014-07-06 ENCOUNTER — Other Ambulatory Visit: Payer: Self-pay | Admitting: Cardiology

## 2014-08-04 ENCOUNTER — Other Ambulatory Visit: Payer: Self-pay | Admitting: *Deleted

## 2014-08-04 MED ORDER — METFORMIN HCL ER (OSM) 500 MG PO TB24: ORAL_TABLET | ORAL | Status: DC

## 2014-08-04 NOTE — Telephone Encounter (Signed)
Patient's wife came in for an appt and stated the pts insurance will no longer approve the Metformin 1036m and the 5056mis approved.  A refill was sent to the pts pharmacy for the 50027mnd she stated she will inform the pt.

## 2014-08-05 ENCOUNTER — Telehealth: Payer: Self-pay | Admitting: Family Medicine

## 2014-08-05 NOTE — Telephone Encounter (Signed)
I called the [facility] on [address] and spoke with Nira Conn and she pulled the Rx from yesterday, stated she will order this and have it ready for the pt to pick up after 1pm tomorrow.  I called the pts wife and informed her of this and she stated she will let Mr Chriscoe know.

## 2014-08-05 NOTE — Telephone Encounter (Signed)
Pt states the [facility] [address]  does not have the metformin (FORTAMET) 1000 MG (OSM) 24 hr tablet Pt would like to know if you want to change this med or should he wait until they get this med in? (Or change to another pharm?)

## 2014-08-13 ENCOUNTER — Telehealth: Payer: Self-pay | Admitting: Family Medicine

## 2014-08-13 NOTE — Telephone Encounter (Signed)
Dr Maudie Mercury- I did not in the medication history that you prescribed the Potassium for the pt.

## 2014-08-13 NOTE — Telephone Encounter (Signed)
Pt needs refill on atorvastatin 20 mg #90,amlodipine 5 mg #90 and potassium chloride #30 with refills sent to [facility] [address]. Per pt wife metformin is still on backorder please advise

## 2014-08-14 MED ORDER — ATORVASTATIN CALCIUM 20 MG PO TABS: 20.0000 mg | ORAL_TABLET | Freq: Every day | ORAL | Status: DC

## 2014-08-14 MED ORDER — AMLODIPINE BESYLATE 5 MG PO TABS: 5.0000 mg | ORAL_TABLET | Freq: Every day | ORAL | Status: DC

## 2014-08-14 NOTE — Telephone Encounter (Signed)
I called [facility] and spoke with Wells Guiles and she stated she will order the Metformin and call the pt when this comes in.  I called the pts wife and scheduled the pt for a lab appt and advised her the Metformin will be ordered and she requested the other medications be sent to [facility].

## 2014-08-14 NOTE — Telephone Encounter (Signed)
He needs to come in for fasting labs. Then we will know if needs potassium. Pk to refill other meds and have him come for fasting labs in the next 1 week. Thanks.

## 2014-08-20 ENCOUNTER — Other Ambulatory Visit (INDEPENDENT_AMBULATORY_CARE_PROVIDER_SITE_OTHER): Payer: Medicare HMO

## 2014-08-20 DIAGNOSIS — I10 Essential (primary) hypertension: Secondary | ICD-10-CM

## 2014-08-20 DIAGNOSIS — E785 Hyperlipidemia, unspecified: Secondary | ICD-10-CM

## 2014-08-20 DIAGNOSIS — E1165 Type 2 diabetes mellitus with hyperglycemia: Secondary | ICD-10-CM

## 2014-08-20 DIAGNOSIS — IMO0002 Reserved for concepts with insufficient information to code with codable children: Secondary | ICD-10-CM

## 2014-08-20 LAB — LIPID PANEL
CHOLESTEROL: 108 mg/dL (ref 0–200)
HDL: 35.5 mg/dL — ABNORMAL LOW (ref >39.00)
LDL CALC: 56 mg/dL (ref 0–99)
NonHDL: 72.5
TRIGLYCERIDES: 82 mg/dL (ref 0.0–149.0)
Total CHOL/HDL Ratio: 3
VLDL: 16.4 mg/dL (ref 0.0–40.0)

## 2014-08-20 LAB — BASIC METABOLIC PANEL WITH GFR
BUN: 12 mg/dL (ref 6–23)
CO2: 28 meq/L (ref 19–32)
Calcium: 9.7 mg/dL (ref 8.4–10.5)
Chloride: 104 meq/L (ref 96–112)
Creatinine, Ser: 1 mg/dL (ref 0.4–1.5)
GFR: 102.04 mL/min
Glucose, Bld: 137 mg/dL — ABNORMAL HIGH (ref 70–99)
Potassium: 3.9 meq/L (ref 3.5–5.1)
Sodium: 139 meq/L (ref 135–145)

## 2014-08-20 LAB — MICROALBUMIN / CREATININE URINE RATIO
Creatinine,U: 119.3 mg/dL
Microalb Creat Ratio: 0.4 mg/g (ref 0.0–30.0)
Microalb, Ur: 0.5 mg/dL (ref 0.0–1.9)

## 2014-08-20 LAB — HEMOGLOBIN A1C: Hgb A1c MFr Bld: 8.2 % — ABNORMAL HIGH (ref 4.6–6.5)

## 2014-08-21 MED ORDER — SITAGLIPTIN PHOSPHATE 50 MG PO TABS: 50.0000 mg | ORAL_TABLET | Freq: Every day | ORAL | Status: DC

## 2014-08-21 MED ORDER — METFORMIN HCL 1000 MG PO TABS: 1000.0000 mg | ORAL_TABLET | Freq: Two times a day (BID) | ORAL | Status: DC

## 2014-08-21 NOTE — Addendum Note (Signed)
Addended by: Agnes Lawrence on: 08/21/2014 12:02 PM   Modules accepted: Orders

## 2014-09-26 ENCOUNTER — Other Ambulatory Visit: Payer: Medicare HMO

## 2014-10-03 ENCOUNTER — Ambulatory Visit: Payer: Medicare HMO | Admitting: Family Medicine

## 2014-10-16 ENCOUNTER — Ambulatory Visit: Payer: Medicare HMO | Admitting: Family Medicine

## 2014-10-17 HISTORY — PX: OTHER SURGICAL HISTORY: SHX169

## 2014-11-19 ENCOUNTER — Ambulatory Visit: Payer: Medicare HMO | Admitting: Family Medicine

## 2014-11-20 ENCOUNTER — Ambulatory Visit (INDEPENDENT_AMBULATORY_CARE_PROVIDER_SITE_OTHER): Payer: Medicare HMO | Admitting: Family Medicine

## 2014-11-20 ENCOUNTER — Ambulatory Visit: Payer: Medicare HMO | Admitting: Family Medicine

## 2014-11-20 ENCOUNTER — Telehealth: Payer: Self-pay | Admitting: *Deleted

## 2014-11-20 ENCOUNTER — Encounter: Payer: Self-pay | Admitting: Family Medicine

## 2014-11-20 VITALS — BP 142/94 | HR 76 | Temp 97.6°F | Ht 70.0 in | Wt 241.0 lb

## 2014-11-20 DIAGNOSIS — E785 Hyperlipidemia, unspecified: Secondary | ICD-10-CM

## 2014-11-20 DIAGNOSIS — E1165 Type 2 diabetes mellitus with hyperglycemia: Principal | ICD-10-CM

## 2014-11-20 DIAGNOSIS — E1149 Type 2 diabetes mellitus with other diabetic neurological complication: Secondary | ICD-10-CM

## 2014-11-20 DIAGNOSIS — E1141 Type 2 diabetes mellitus with diabetic mononeuropathy: Secondary | ICD-10-CM

## 2014-11-20 DIAGNOSIS — I10 Essential (primary) hypertension: Secondary | ICD-10-CM

## 2014-11-20 DIAGNOSIS — IMO0002 Reserved for concepts with insufficient information to code with codable children: Secondary | ICD-10-CM

## 2014-11-20 LAB — LIPID PANEL
CHOL/HDL RATIO: 3
Cholesterol: 103 mg/dL (ref 0–200)
HDL: 32.1 mg/dL — ABNORMAL LOW (ref >39.00)
LDL Cholesterol: 50 mg/dL (ref 0–99)
NONHDL: 70.9
Triglycerides: 107 mg/dL (ref 0.0–149.0)
VLDL: 21.4 mg/dL (ref 0.0–40.0)

## 2014-11-20 LAB — BASIC METABOLIC PANEL
BUN: 18 mg/dL (ref 6–23)
CHLORIDE: 102 meq/L (ref 96–112)
CO2: 26 meq/L (ref 19–32)
Calcium: 9.9 mg/dL (ref 8.4–10.5)
Creatinine, Ser: 1.08 mg/dL (ref 0.40–1.50)
GFR: 87.93 mL/min (ref >60.00)
Glucose, Bld: 164 mg/dL — ABNORMAL HIGH (ref 70–99)
POTASSIUM: 3.5 meq/L (ref 3.5–5.1)
SODIUM: 139 meq/L (ref 135–145)

## 2014-11-20 LAB — HEMOGLOBIN A1C: HEMOGLOBIN A1C: 8.8 % — AB (ref 4.6–6.5)

## 2014-11-20 MED ORDER — SITAGLIPTIN PHOSPHATE 100 MG PO TABS: 100.0000 mg | ORAL_TABLET | Freq: Every day | ORAL | Status: DC

## 2014-11-20 MED ORDER — GLUCOSE BLOOD VI STRP: ORAL_STRIP | Status: DC

## 2014-11-20 MED ORDER — PIOGLITAZONE HCL 15 MG PO TABS: 15.0000 mg | ORAL_TABLET | Freq: Every day | ORAL | Status: DC

## 2014-11-20 NOTE — Telephone Encounter (Signed)
Patient called and stated John Hobbs advised him the Januvia costs $141 a month and he requests to be changed to Actos as this comes in a generic.  Please send Rx to [facility].

## 2014-11-20 NOTE — Progress Notes (Signed)
Pre visit review using our clinic review tool, if applicable. No additional management support is needed unless otherwise documented below in the visit note. 
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
## 2014-11-20 NOTE — Telephone Encounter (Signed)
rx sent

## 2014-11-20 NOTE — Patient Instructions (Signed)
BEFORE YOU LEAVE: -printed prescription for Januvia  -labs -schedule 3 month follow up  Check on cost of Januvia at [place] - if not affordable call us today so that we can send Actos  -We have ordered labs or studies at this visit. It can take up to 1-2 weeks for results and processing. We will contact you with instructions IF your results are abnormal. Normal results will be released to your [facility]. If you have not heard from [place] or can not find your results in [facility] in 2 weeks please contact our office.  -PLEASE SIGN UP FOR MYCHART TODAY   We recommend the following healthy lifestyle measures: - eat a healthy diet consisting of lots of vegetables, fruits, beans, nuts, seeds, healthy meats such as white chicken and fish and whole grains.  - avoid fried foods, fast food, processed foods, sodas, red meet and other fattening foods.  - get a least 150 minutes of aerobic exercise per week.

## 2014-11-25 ENCOUNTER — Other Ambulatory Visit: Payer: Self-pay | Admitting: Internal Medicine

## 2014-12-03 ENCOUNTER — Other Ambulatory Visit: Payer: Self-pay | Admitting: Internal Medicine

## 2014-12-04 ENCOUNTER — Telehealth: Payer: Self-pay | Admitting: Family Medicine

## 2014-12-04 DIAGNOSIS — I10 Essential (primary) hypertension: Secondary | ICD-10-CM

## 2014-12-04 MED ORDER — ATENOLOL-CHLORTHALIDONE 100-25 MG PO TABS: ORAL_TABLET | ORAL | Status: DC

## 2014-12-04 NOTE — Telephone Encounter (Signed)
Pt request refill atenolol-chlorthalidone (TENORETIC 100) 100-25 MG per tablet [facility]  Pt advised by dr Maudie Mercury to use all he had from other doc then she would refill

## 2014-12-04 NOTE — Telephone Encounter (Signed)
Rx done. 

## 2015-01-16 ENCOUNTER — Other Ambulatory Visit: Payer: Self-pay | Admitting: Internal Medicine

## 2015-01-22 ENCOUNTER — Other Ambulatory Visit: Payer: Self-pay | Admitting: *Deleted

## 2015-01-22 MED ORDER — LOSARTAN POTASSIUM 100 MG PO TABS: 100.0000 mg | ORAL_TABLET | Freq: Every day | ORAL | Status: DC

## 2015-01-22 NOTE — Telephone Encounter (Signed)
Rx done and the pts wife was informed.

## 2015-02-18 ENCOUNTER — Other Ambulatory Visit: Payer: Self-pay | Admitting: Family Medicine

## 2015-02-18 ENCOUNTER — Ambulatory Visit (INDEPENDENT_AMBULATORY_CARE_PROVIDER_SITE_OTHER): Payer: Medicare HMO | Admitting: Family Medicine

## 2015-02-18 ENCOUNTER — Encounter: Payer: Self-pay | Admitting: Family Medicine

## 2015-02-18 VITALS — BP 132/84 | HR 78 | Temp 97.9°F | Ht 70.0 in | Wt 246.5 lb

## 2015-02-18 DIAGNOSIS — E876 Hypokalemia: Secondary | ICD-10-CM

## 2015-02-18 DIAGNOSIS — I10 Essential (primary) hypertension: Secondary | ICD-10-CM

## 2015-02-18 DIAGNOSIS — E1149 Type 2 diabetes mellitus with other diabetic neurological complication: Secondary | ICD-10-CM

## 2015-02-18 DIAGNOSIS — E785 Hyperlipidemia, unspecified: Secondary | ICD-10-CM | POA: Diagnosis not present

## 2015-02-18 DIAGNOSIS — E1165 Type 2 diabetes mellitus with hyperglycemia: Principal | ICD-10-CM

## 2015-02-18 DIAGNOSIS — E1141 Type 2 diabetes mellitus with diabetic mononeuropathy: Secondary | ICD-10-CM | POA: Diagnosis not present

## 2015-02-18 DIAGNOSIS — Z23 Encounter for immunization: Secondary | ICD-10-CM

## 2015-02-18 DIAGNOSIS — IMO0002 Reserved for concepts with insufficient information to code with codable children: Secondary | ICD-10-CM

## 2015-02-18 LAB — LIPID PANEL
CHOLESTEROL: 101 mg/dL (ref 0–200)
HDL: 33.4 mg/dL — ABNORMAL LOW (ref >39.00)
LDL Cholesterol: 51 mg/dL (ref 0–99)
NonHDL: 67.6
TRIGLYCERIDES: 81 mg/dL (ref 0.0–149.0)
Total CHOL/HDL Ratio: 3
VLDL: 16.2 mg/dL (ref 0.0–40.0)

## 2015-02-18 LAB — BASIC METABOLIC PANEL
BUN: 11 mg/dL (ref 6–23)
CALCIUM: 9.7 mg/dL (ref 8.4–10.5)
CO2: 29 meq/L (ref 19–32)
Chloride: 103 mEq/L (ref 96–112)
Creatinine, Ser: 0.98 mg/dL (ref 0.40–1.50)
GFR: 98.29 mL/min (ref >60.00)
GLUCOSE: 140 mg/dL — AB (ref 70–99)
POTASSIUM: 3.6 meq/L (ref 3.5–5.1)
Sodium: 139 mEq/L (ref 135–145)

## 2015-02-18 LAB — HEMOGLOBIN A1C: Hgb A1c MFr Bld: 7.5 % — ABNORMAL HIGH (ref 4.6–6.5)

## 2015-02-18 MED ORDER — GLIPIZIDE 10 MG PO TABS: 15.0000 mg | ORAL_TABLET | Freq: Two times a day (BID) | ORAL | Status: DC

## 2015-02-18 NOTE — Progress Notes (Signed)
Pre visit review using our clinic review tool, if applicable. No additional management support is needed unless otherwise documented below in the visit note. 

## 2015-02-18 NOTE — Addendum Note (Signed)
Addended by: Agnes Lawrence on: 02/18/2015 10:09 AM   Modules accepted: Orders
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
## 2015-02-18 NOTE — Patient Instructions (Signed)
BEFORE YOU LEAVE: -labs -prevnar 13 -follow up in 3-4 months  We recommend the following healthy lifestyle measures: - eat a healthy diet consisting of lots of vegetables, fruits, beans, nuts, seeds, healthy meats such as white chicken and fish   - avoid sweets, starches, fried foods, fast food, processed foods, sodas, red meet and other fattening foods.  - get a least 150 minutes of aerobic exercise per week.   Please have your eye doctor fax your eye exam results to our office. Thank you!

## 2015-02-19 LAB — HM DIABETES EYE EXAM

## 2015-02-23 ENCOUNTER — Encounter: Payer: Self-pay | Admitting: Family Medicine

## 2015-02-24 ENCOUNTER — Other Ambulatory Visit: Payer: Self-pay | Admitting: Family Medicine

## 2015-03-24 ENCOUNTER — Other Ambulatory Visit: Payer: Self-pay | Admitting: Family Medicine

## 2015-05-18 ENCOUNTER — Ambulatory Visit (INDEPENDENT_AMBULATORY_CARE_PROVIDER_SITE_OTHER): Payer: Medicare HMO | Admitting: Family Medicine

## 2015-05-18 DIAGNOSIS — R69 Illness, unspecified: Secondary | ICD-10-CM

## 2015-05-18 NOTE — Progress Notes (Signed)
NO SHOW  On review of chart prior to appt:  1) Uncontrolled DM/Elevated Blood sugar:  -longstanding uncontrolled diabetes, poor compliance with care recs, new patient to me recently -meds: metformin 1000 bid, glipizide 1.5 tablets bid and actos 66m daily  -diet and exercise: poor -home BS: does not check  -referred to diabetes educator: did see diabetes educator -eye exam: done -denies  2)HTN/HLD:  -followed by Dr. NMeda Coffee -history atypical cp - evaluated with cardiology 11/2013 with lexiscan nuclear stress test  -meds: asa, norvasc 556m tenoretic (atenolol-cholrthalidone), losartan, potasium chloride, atorvastatin  -denies:   3)Obesity/NASH:  -diet and exercise: eats fish and chicken, liver, fruits and veggies - cutting back on bread, eats potatoes, does drink sweet soda and lemonade sometimes  -reports drinks 2-3 drinks a few times per month, no cocaine use

## 2015-05-21 ENCOUNTER — Ambulatory Visit: Payer: Medicare HMO | Admitting: Family Medicine

## 2015-05-30 ENCOUNTER — Other Ambulatory Visit: Payer: Self-pay | Admitting: Family Medicine

## 2015-06-29 ENCOUNTER — Other Ambulatory Visit: Payer: Self-pay | Admitting: Family Medicine

## 2015-07-15 ENCOUNTER — Other Ambulatory Visit: Payer: Self-pay | Admitting: *Deleted

## 2015-07-15 MED ORDER — AMLODIPINE BESYLATE 5 MG PO TABS: 5.0000 mg | ORAL_TABLET | Freq: Every day | ORAL | Status: DC

## 2015-07-15 NOTE — Telephone Encounter (Signed)
Rx done and the pts wife scheduled follow up visit for 10/11.

## 2015-07-28 ENCOUNTER — Encounter: Payer: Self-pay | Admitting: Family Medicine

## 2015-07-28 ENCOUNTER — Ambulatory Visit (INDEPENDENT_AMBULATORY_CARE_PROVIDER_SITE_OTHER): Payer: Medicare HMO | Admitting: Family Medicine

## 2015-07-28 VITALS — BP 118/82 | HR 58 | Temp 97.8°F | Ht 70.0 in | Wt 252.8 lb

## 2015-07-28 DIAGNOSIS — Z23 Encounter for immunization: Secondary | ICD-10-CM

## 2015-07-28 DIAGNOSIS — E1165 Type 2 diabetes mellitus with hyperglycemia: Secondary | ICD-10-CM

## 2015-07-28 DIAGNOSIS — IMO0002 Reserved for concepts with insufficient information to code with codable children: Secondary | ICD-10-CM

## 2015-07-28 DIAGNOSIS — I10 Essential (primary) hypertension: Secondary | ICD-10-CM

## 2015-07-28 DIAGNOSIS — E785 Hyperlipidemia, unspecified: Secondary | ICD-10-CM

## 2015-07-28 DIAGNOSIS — K7581 Nonalcoholic steatohepatitis (NASH): Secondary | ICD-10-CM | POA: Diagnosis not present

## 2015-07-28 DIAGNOSIS — M25562 Pain in left knee: Secondary | ICD-10-CM

## 2015-07-28 DIAGNOSIS — E1140 Type 2 diabetes mellitus with diabetic neuropathy, unspecified: Secondary | ICD-10-CM

## 2015-07-28 DIAGNOSIS — R740 Nonspecific elevation of levels of transaminase and lactic acid dehydrogenase [LDH]: Secondary | ICD-10-CM

## 2015-07-28 DIAGNOSIS — R7402 Elevation of levels of lactic acid dehydrogenase (LDH): Secondary | ICD-10-CM

## 2015-07-28 LAB — BASIC METABOLIC PANEL
BUN: 15 mg/dL (ref 6–23)
CO2: 30 meq/L (ref 19–32)
CREATININE: 1.12 mg/dL (ref 0.40–1.50)
Calcium: 9.7 mg/dL (ref 8.4–10.5)
Chloride: 102 mEq/L (ref 96–112)
GFR: 84.14 mL/min (ref >60.00)
Glucose, Bld: 141 mg/dL — ABNORMAL HIGH (ref 70–99)
Potassium: 4.1 mEq/L (ref 3.5–5.1)
Sodium: 141 mEq/L (ref 135–145)

## 2015-07-28 LAB — HEMOGLOBIN A1C: Hgb A1c MFr Bld: 8 % — ABNORMAL HIGH (ref 4.6–6.5)

## 2015-07-28 NOTE — Addendum Note (Signed)
Addended by: Agnes Lawrence on: 07/28/2015 12:42 PM   Modules accepted: Orders

## 2015-07-28 NOTE — Progress Notes (Signed)
Pre visit review using our clinic review tool, if applicable. No additional management support is needed unless otherwise documented below in the visit note. 

## 2015-07-28 NOTE — Patient Instructions (Signed)
BEFORE YOU LEAVE: -labs -flu shot -follow up in 1 month regarding knee pain  Topical sports creams for knee with capsaicin or menthol as needed Aleve for bad days per instruction  Gentle aerobic exercise such as cycling or swimming  We have ordered labs or studies at this visit. It can take up to 1-2 weeks for results and processing. We will contact you with instructions IF your results are abnormal. Normal results will be released to your [facility]. If you have not heard from [place] or can not find your results in [facility] in 2 weeks please contact our office.  We recommend the following healthy lifestyle measures: - eat a healthy whole foods diet consisting of regular small meals composed of vegetables, fruits, beans, nuts, seeds, healthy meats such as white chicken and fish and whole grains.  - avoid sweets, white starchy foods, fried foods, fast food, processed foods, sodas, red meet and other fattening foods.  - get a least 150-300 minutes of aerobic exercise per week.
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
## 2015-07-29 ENCOUNTER — Telehealth: Payer: Self-pay

## 2015-07-29 ENCOUNTER — Other Ambulatory Visit: Payer: Self-pay | Admitting: *Deleted

## 2015-07-29 DIAGNOSIS — E1140 Type 2 diabetes mellitus with diabetic neuropathy, unspecified: Secondary | ICD-10-CM

## 2015-07-29 DIAGNOSIS — IMO0002 Reserved for concepts with insufficient information to code with codable children: Secondary | ICD-10-CM

## 2015-07-29 DIAGNOSIS — I10 Essential (primary) hypertension: Secondary | ICD-10-CM

## 2015-07-29 DIAGNOSIS — E1165 Type 2 diabetes mellitus with hyperglycemia: Principal | ICD-10-CM

## 2015-07-29 MED ORDER — PIOGLITAZONE HCL 15 MG PO TABS: 15.0000 mg | ORAL_TABLET | Freq: Every day | ORAL | Status: DC

## 2015-07-29 MED ORDER — LOSARTAN POTASSIUM 100 MG PO TABS: 100.0000 mg | ORAL_TABLET | Freq: Every day | ORAL | Status: DC

## 2015-07-29 MED ORDER — ATORVASTATIN CALCIUM 20 MG PO TABS: 20.0000 mg | ORAL_TABLET | Freq: Every day | ORAL | Status: DC

## 2015-07-29 MED ORDER — GLIPIZIDE 10 MG PO TABS: 15.0000 mg | ORAL_TABLET | Freq: Two times a day (BID) | ORAL | Status: DC

## 2015-07-29 MED ORDER — AMLODIPINE BESYLATE 5 MG PO TABS: 5.0000 mg | ORAL_TABLET | Freq: Every day | ORAL | Status: DC

## 2015-07-29 MED ORDER — METFORMIN HCL ER (OSM) 1000 MG PO TB24: 2000.0000 mg | ORAL_TABLET | Freq: Every day | ORAL | Status: DC

## 2015-07-29 MED ORDER — ATENOLOL-CHLORTHALIDONE 100-25 MG PO TABS: ORAL_TABLET | ORAL | Status: DC

## 2015-07-29 NOTE — Telephone Encounter (Signed)
Left message for patient to call office regarding lab work.  Appointment time adjusted.

## 2015-07-29 NOTE — Telephone Encounter (Signed)
Rxs done. 

## 2015-07-29 NOTE — Telephone Encounter (Signed)
-----   Message from Lucretia Kern, DO sent at 07/29/2015  6:20 AM EDT ----- Diabetes lab a bit worse and uncontrolled. Advise nutrition counseling appointment again to help with diet, regular exercise and change 1 month follow up to 30 minutes as I think we will perhaps need to start a once per day insulin. Thanks.

## 2015-08-03 ENCOUNTER — Encounter: Payer: Self-pay | Admitting: Adult Health

## 2015-08-03 ENCOUNTER — Ambulatory Visit (INDEPENDENT_AMBULATORY_CARE_PROVIDER_SITE_OTHER): Payer: Medicare HMO | Admitting: Adult Health

## 2015-08-03 VITALS — BP 120/84 | Temp 98.6°F | Ht 70.0 in | Wt 249.7 lb

## 2015-08-03 DIAGNOSIS — M25562 Pain in left knee: Secondary | ICD-10-CM | POA: Diagnosis not present

## 2015-08-03 MED ORDER — MELOXICAM 7.5 MG PO TABS: 7.5000 mg | ORAL_TABLET | Freq: Every day | ORAL | Status: DC

## 2015-08-03 NOTE — Patient Instructions (Signed)
It was great meeting you today!  I have sent in a prescription for Mobic to the pharmacy. Take this once a day as needed for pain relief. If it does not decrease your pain at all, then stop taking it.   Ice the knee for 20 minutes on and 20 minutes off. Keep the leg elevated as needed.   Someone from sport medicine will call you to schedule an appointment.

## 2015-08-03 NOTE — Progress Notes (Signed)
   Subjective:    Patient ID: John Hobbs, male    DOB: 01-14-1946, 69 y.o.   MRN: [id number]  HPI 69 year old male who presents to the office for left knee pain for the last week and half. The pain feels as though it is "dull" and feels as though "something is wobbling around in there." This has been an ongoing issue for the last few years. He has followed up with Dr. Rush Farmer (ortho) 2-3 months ago and which time he had a steroid injectiont.He also endorses that xrays were taken which " showed nothing". There are some conflicting reports on when this actually was.    The pain was " really bad over the weekend". He did endorse relief with 155m tylenol.   Pain is mostly when he is up walking around but does endorse pain with rest.   Saw his PCP Dr. KMaudie Mercuryabout this issue last week.   Review of Systems  Constitutional: Positive for activity change.  Musculoskeletal: Positive for myalgias, arthralgias and gait problem. Negative for joint swelling, neck pain and neck stiffness.  Skin: Negative.   Neurological: Negative.   Psychiatric/Behavioral: Positive for sleep disturbance.  All other systems reviewed and are negative.      Objective:   Physical Exam  Constitutional: He is oriented to person, place, and time. He appears well-developed and well-nourished. No distress.  Musculoskeletal: Normal range of motion. He exhibits no edema or tenderness.  No effusion over the joint. No pain with palpation, no redness or warmth. He had good range of motion while on exam table. His pain came from standing and walking.   No bakers cyst noted.   Neurological: He is alert and oriented to person, place, and time. He has normal reflexes.  Skin: Skin is warm. No rash noted. No erythema. No pallor.  Psychiatric: He has a normal mood and affect. His behavior is normal. Judgment and thought content normal.  Nursing note and vitals reviewed.      Assessment & Plan:  1. Left knee pain - Likely OA    - meloxicam (MOBIC) 7.5 MG tablet; Take 1 tablet (7.5 mg total) by mouth daily.  Dispense: 30 tablet; Refill: 0 - Ambulatory referral to Sports Medicine- he would like a second opinion on his left knee pain - ACE wrap applied to left knee - Advised to take the Mobic only as needed - Ice, rest, and elevation.

## 2015-08-03 NOTE — Progress Notes (Signed)
Pre visit review using our clinic review tool, if applicable. No additional management support is needed unless otherwise documented below in the visit note. 

## 2015-08-04 NOTE — Addendum Note (Signed)
Addended by: Agnes Lawrence on: 08/04/2015 10:51 AM   Modules accepted: Orders

## 2015-08-10 ENCOUNTER — Ambulatory Visit (INDEPENDENT_AMBULATORY_CARE_PROVIDER_SITE_OTHER): Payer: Medicare HMO

## 2015-08-10 ENCOUNTER — Ambulatory Visit (INDEPENDENT_AMBULATORY_CARE_PROVIDER_SITE_OTHER): Payer: Medicare HMO | Admitting: Family Medicine

## 2015-08-10 ENCOUNTER — Encounter: Payer: Self-pay | Admitting: Family Medicine

## 2015-08-10 VITALS — BP 122/82 | HR 70 | Ht 70.0 in | Wt 248.0 lb

## 2015-08-10 DIAGNOSIS — S83242A Other tear of medial meniscus, current injury, left knee, initial encounter: Secondary | ICD-10-CM | POA: Diagnosis not present

## 2015-08-10 DIAGNOSIS — M25562 Pain in left knee: Secondary | ICD-10-CM

## 2015-08-10 DIAGNOSIS — M25462 Effusion, left knee: Secondary | ICD-10-CM

## 2015-08-10 DIAGNOSIS — S83249A Other tear of medial meniscus, current injury, unspecified knee, initial encounter: Secondary | ICD-10-CM | POA: Insufficient documentation

## 2015-08-10 NOTE — Assessment & Plan Note (Signed)
Patient had aspiration done today and tolerated the procedure very well. We discussed icing regimen. We discussed which activities to do an which was to potentially avoid. Patient given a brace. Work with Product/process development scientist today. Patient did feel significantly better after the aspiration. Patient does not make any significant improvement I do think advance imaging is warranted. It appears the patient does have only mild to moderate osteophytic changes so he could be a candidate for arthroscopic procedure. We discussed also the possibility of formal physical therapy. Patient will come back and be evaluated again in 3-4 weeks.

## 2015-08-10 NOTE — Patient Instructions (Addendum)
Good to see you.  Ice 20 minutes 2 times daily. Usually after activity and before bed. Exercises 3 times a week.  pennsaid pinkie amount topically 2 times daily as needed.  Turmeric 514m twice daily Vitamin D 2000 IU daily Wear brace with activity See me again in 3-4 weeks.  If pain come back before then give me a call and I will order MRI.
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
## 2015-08-11 ENCOUNTER — Telehealth: Payer: Self-pay | Admitting: Family Medicine

## 2015-08-11 ENCOUNTER — Other Ambulatory Visit: Payer: Self-pay

## 2015-08-11 DIAGNOSIS — M25462 Effusion, left knee: Secondary | ICD-10-CM

## 2015-08-11 NOTE — Telephone Encounter (Signed)
Pt called in and would like to go head and get that MRI on his knee

## 2015-08-29 ENCOUNTER — Ambulatory Visit
Admission: RE | Admit: 2015-08-29 | Discharge: 2015-08-29 | Disposition: A | Discharge status: Home / Self Care | Payer: Medicare HMO | Source: Ambulatory Visit | Attending: Family Medicine | Admitting: Family Medicine

## 2015-08-29 DIAGNOSIS — M25462 Effusion, left knee: Secondary | ICD-10-CM

## 2015-08-31 ENCOUNTER — Other Ambulatory Visit: Payer: Self-pay | Admitting: *Deleted

## 2015-08-31 ENCOUNTER — Ambulatory Visit (INDEPENDENT_AMBULATORY_CARE_PROVIDER_SITE_OTHER): Payer: Medicare HMO | Admitting: Family Medicine

## 2015-08-31 ENCOUNTER — Encounter: Payer: Self-pay | Admitting: Family Medicine

## 2015-08-31 VITALS — BP 126/82 | HR 84 | Temp 97.7°F | Ht 70.0 in | Wt 248.9 lb

## 2015-08-31 DIAGNOSIS — IMO0002 Reserved for concepts with insufficient information to code with codable children: Secondary | ICD-10-CM

## 2015-08-31 DIAGNOSIS — E1165 Type 2 diabetes mellitus with hyperglycemia: Secondary | ICD-10-CM

## 2015-08-31 DIAGNOSIS — S83242A Other tear of medial meniscus, current injury, left knee, initial encounter: Secondary | ICD-10-CM

## 2015-08-31 DIAGNOSIS — E1140 Type 2 diabetes mellitus with diabetic neuropathy, unspecified: Secondary | ICD-10-CM

## 2015-08-31 DIAGNOSIS — I10 Essential (primary) hypertension: Secondary | ICD-10-CM

## 2015-08-31 MED ORDER — INSULIN GLARGINE 100 UNIT/ML SOLOSTAR PEN: 7.0000 [IU] | PEN_INJECTOR | Freq: Every day | SUBCUTANEOUS | Status: DC

## 2015-08-31 NOTE — Patient Instructions (Addendum)
Follow up in 2 -3 months  Star the lantus 7 units nightly as instructed  Ensure eating 3 regular healthy meals per day and do not skip meals  If any low blood sugars (<70) eat a snack and call immediately  FOR YOUR DIABETES:  []  Eat a healthy low carb diet (avoid sweets, sweet drinks, breads, potatoes, rice, etc.) and ensure 3 small meals daily.  []  Get AT LEAST 150 minutes of cardiovascular exercise per week - 30 minutes per day is best of sustained sweaty exercise.  []  Take all of your medications every day as directed by your doctor. Call your doctor immediately if you have any questions about your medications or are running low.  []  Check your blood sugar often and when you feel unwell and keep a log to bring to all health appointments. FASTING: before you eat anything in the morning POSTPRANDIAL: 1-2 hours after a meal  []  If any low blood sugars < 70, eat a snack and call your doctor immediately.  []  See an eye doctor every year and fax your diabetic eye exam to our office.  Fax: [phone]  []  Take good care of your feet and keep them soft and callus free. Check your feet daily and wear comfortable shoes. See your doctor immediately if you have any cuts, calluses or wounds on your feet.
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
## 2015-08-31 NOTE — Progress Notes (Signed)
Pre visit review using our clinic review tool, if applicable. No additional management support is needed unless otherwise documented below in the visit note. 

## 2015-09-01 ENCOUNTER — Telehealth: Payer: Self-pay | Admitting: Family Medicine

## 2015-09-01 ENCOUNTER — Ambulatory Visit: Payer: Medicare HMO | Admitting: Family Medicine

## 2015-09-01 NOTE — Telephone Encounter (Signed)
States Dr. Ninfa Linden has not received MRI.

## 2015-09-01 NOTE — Telephone Encounter (Signed)
Faxed mri to [facility].

## 2015-09-29 ENCOUNTER — Ambulatory Visit (INDEPENDENT_AMBULATORY_CARE_PROVIDER_SITE_OTHER): Payer: Medicare HMO | Admitting: Family Medicine

## 2015-09-29 ENCOUNTER — Encounter: Payer: Self-pay | Admitting: Family Medicine

## 2015-09-29 VITALS — BP 120/80 | HR 81 | Temp 97.9°F | Ht 70.5 in | Wt 246.8 lb

## 2015-09-29 DIAGNOSIS — E1140 Type 2 diabetes mellitus with diabetic neuropathy, unspecified: Secondary | ICD-10-CM

## 2015-09-29 DIAGNOSIS — Z0000 Encounter for general adult medical examination without abnormal findings: Secondary | ICD-10-CM

## 2015-09-29 DIAGNOSIS — E785 Hyperlipidemia, unspecified: Secondary | ICD-10-CM

## 2015-09-29 DIAGNOSIS — I10 Essential (primary) hypertension: Secondary | ICD-10-CM | POA: Diagnosis not present

## 2015-09-29 DIAGNOSIS — IMO0002 Reserved for concepts with insufficient information to code with codable children: Secondary | ICD-10-CM

## 2015-09-29 DIAGNOSIS — K7581 Nonalcoholic steatohepatitis (NASH): Secondary | ICD-10-CM | POA: Diagnosis not present

## 2015-09-29 DIAGNOSIS — E1165 Type 2 diabetes mellitus with hyperglycemia: Secondary | ICD-10-CM

## 2015-09-29 DIAGNOSIS — S83242A Other tear of medial meniscus, current injury, left knee, initial encounter: Secondary | ICD-10-CM
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
## 2015-09-29 NOTE — Patient Instructions (Addendum)
BEFORE YOU LEAVE: -schedule lab only appointment for FASTING labs in 2 months -schedule follow up with me in 3 months -please cancel all other appointments  We recommend the following healthy lifestyle measures: - eat a healthy whole foods diet consisting of regular small meals composed of vegetables, fruits, beans, nuts, seeds, healthy meats such as white chicken and fish and whole grains.  - avoid sweets, white starchy foods, fried foods, fast food, processed foods, sodas, red meet and other fattening foods.  - get a least 150-300 minutes of aerobic exercise per week.   FOR YOUR DIABETES:  []  Eat a healthy low carb diet (avoid sweets, sweet drinks, breads, potatoes, rice, etc.) and ensure 3 small meals daily.  []  Get AT LEAST 150 minutes of cardiovascular exercise per week - 30 minutes per day is best of sustained sweaty exercise.  []  Take all of your medications every day as directed by your doctor. Call your doctor immediately if you have any questions about your medications or are running low.  []  Check your blood sugar often and when you feel unwell and keep a log to bring to all health appointments. FASTING: before you eat anything in the morning POSTPRANDIAL: 1-2 hours after a meal  []  If any low blood sugars < 70, eat a snack and call your doctor immediately.  []  See an eye doctor every year and fax your diabetic eye exam to our office.  Fax: [phone]  []  Take good care of your feet and keep them soft and callus free. Check your feet daily and wear comfortable shoes. See your doctor immediately if you have any cuts, calluses or wounds on your feet.  Please see a lawyer and/or go to this website to help you with advanced directives and designating a health care power of attorney so that your wishes will be followed should you become too ill to make your own medical decisions.  [email or web address]

## 2015-09-29 NOTE — Addendum Note (Signed)
Addended by: Lucretia Kern on: 09/29/2015 09:33 AM   Modules accepted: Orders, Medications

## 2015-09-29 NOTE — Progress Notes (Signed)
Pre visit review using our clinic review tool, if applicable. No additional management support is needed unless otherwise documented below in the visit note. 

## 2015-11-17 ENCOUNTER — Ambulatory Visit: Payer: Medicare HMO | Admitting: Family Medicine

## 2015-11-30 ENCOUNTER — Other Ambulatory Visit (INDEPENDENT_AMBULATORY_CARE_PROVIDER_SITE_OTHER): Payer: PPO

## 2015-11-30 ENCOUNTER — Other Ambulatory Visit: Payer: Self-pay | Admitting: Family Medicine

## 2015-11-30 DIAGNOSIS — E1140 Type 2 diabetes mellitus with diabetic neuropathy, unspecified: Secondary | ICD-10-CM

## 2015-11-30 DIAGNOSIS — E785 Hyperlipidemia, unspecified: Secondary | ICD-10-CM

## 2015-11-30 DIAGNOSIS — I10 Essential (primary) hypertension: Secondary | ICD-10-CM | POA: Diagnosis not present

## 2015-11-30 DIAGNOSIS — K7581 Nonalcoholic steatohepatitis (NASH): Secondary | ICD-10-CM | POA: Diagnosis not present

## 2015-11-30 DIAGNOSIS — E1165 Type 2 diabetes mellitus with hyperglycemia: Principal | ICD-10-CM

## 2015-11-30 DIAGNOSIS — IMO0002 Reserved for concepts with insufficient information to code with codable children: Secondary | ICD-10-CM

## 2015-11-30 DIAGNOSIS — Z0000 Encounter for general adult medical examination without abnormal findings: Secondary | ICD-10-CM | POA: Diagnosis not present

## 2015-11-30 LAB — LIPID PANEL
Cholesterol: 108 mg/dL (ref 0–200)
HDL: 35.9 mg/dL — AB (ref >39.00)
LDL Cholesterol: 53 mg/dL (ref 0–99)
NonHDL: 72.59
TRIGLYCERIDES: 97 mg/dL (ref 0.0–149.0)
Total CHOL/HDL Ratio: 3
VLDL: 19.4 mg/dL (ref 0.0–40.0)

## 2015-11-30 LAB — COMPREHENSIVE METABOLIC PANEL
ALBUMIN: 4.3 g/dL (ref 3.5–5.2)
ALT: 18 U/L (ref 0–53)
AST: 20 U/L (ref 0–37)
Alkaline Phosphatase: 41 U/L (ref 39–117)
BUN: 12 mg/dL (ref 6–23)
CHLORIDE: 103 meq/L (ref 96–112)
CO2: 31 mEq/L (ref 19–32)
CREATININE: 1 mg/dL (ref 0.40–1.50)
Calcium: 9.5 mg/dL (ref 8.4–10.5)
GFR: 95.8 mL/min (ref >60.00)
GLUCOSE: 199 mg/dL — AB (ref 70–99)
POTASSIUM: 3.8 meq/L (ref 3.5–5.1)
SODIUM: 141 meq/L (ref 135–145)
TOTAL PROTEIN: 7.3 g/dL (ref 6.0–8.3)
Total Bilirubin: 0.5 mg/dL (ref 0.2–1.2)

## 2015-11-30 LAB — HEMOGLOBIN A1C: Hgb A1c MFr Bld: 8.1 % — ABNORMAL HIGH (ref 4.6–6.5)

## 2015-11-30 LAB — PSA: PSA: 0.44 ng/mL (ref 0.10–4.00)

## 2015-11-30 MED ORDER — METFORMIN HCL ER (OSM) 1000 MG PO TB24: 2000.0000 mg | ORAL_TABLET | Freq: Every day | ORAL | Status: DC

## 2015-12-02 ENCOUNTER — Other Ambulatory Visit: Payer: Self-pay | Admitting: Family Medicine

## 2015-12-09 DIAGNOSIS — M25562 Pain in left knee: Secondary | ICD-10-CM | POA: Diagnosis not present

## 2015-12-11 ENCOUNTER — Other Ambulatory Visit: Payer: Self-pay | Admitting: Family Medicine

## 2015-12-22 ENCOUNTER — Ambulatory Visit (INDEPENDENT_AMBULATORY_CARE_PROVIDER_SITE_OTHER): Payer: PPO | Admitting: Endocrinology

## 2015-12-22 ENCOUNTER — Encounter: Payer: Self-pay | Admitting: Endocrinology

## 2015-12-22 VITALS — BP 138/93 | HR 73 | Temp 97.8°F | Ht 70.5 in | Wt 248.0 lb

## 2015-12-22 DIAGNOSIS — E1165 Type 2 diabetes mellitus with hyperglycemia: Secondary | ICD-10-CM | POA: Diagnosis not present

## 2015-12-22 DIAGNOSIS — E1140 Type 2 diabetes mellitus with diabetic neuropathy, unspecified: Secondary | ICD-10-CM | POA: Diagnosis not present

## 2015-12-22 DIAGNOSIS — IMO0002 Reserved for concepts with insufficient information to code with codable children: Secondary | ICD-10-CM

## 2015-12-22 LAB — MICROALBUMIN / CREATININE URINE RATIO
CREATININE, U: 222.5 mg/dL
MICROALB UR: 0.7 mg/dL (ref 0.0–1.9)
Microalb Creat Ratio: 0.3 mg/g (ref 0.0–30.0)

## 2015-12-22 MED ORDER — PIOGLITAZONE HCL 45 MG PO TABS: 45.0000 mg | ORAL_TABLET | Freq: Every day | ORAL | Status: DC

## 2015-12-22 MED ORDER — REPAGLINIDE 2 MG PO TABS: 2.0000 mg | ORAL_TABLET | Freq: Three times a day (TID) | ORAL | Status: DC

## 2015-12-22 NOTE — Patient Instructions (Addendum)
good diet and exercise significantly improve the control of your diabetes.  please let me know if you wish to be referred to a dietician.  high blood sugar is very risky to your health.  you should see an eye doctor and dentist every year.  It is very important to get all recommended vaccinations.  controlling your blood pressure and cholesterol drastically reduces the damage diabetes does to your body.  Those who smoke should quit.  please discuss these with your doctor.  check your blood sugar once a day.  vary the time of day when you check, between before the 3 meals, and at bedtime.  also check if you have symptoms of your blood sugar being too high or too low.  please keep a record of the readings and bring it to your next appointment here (or you can bring the meter itself).  You can write it on any piece of paper.  please call us sooner if your blood sugar goes below 70, or if you have a lot of readings over 200. Our goals are: 1.  Improve your a1c.  2.  Avoid low blood sugar.  3.  Stay with generic meds if we can. i have sent 2 prescriptions to your pharmacy: Increase the pioglitizone, and: Change glipizide to repaglinide.   Please come back for a follow-up appointment in 2 months.
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
## 2015-12-28 ENCOUNTER — Ambulatory Visit (INDEPENDENT_AMBULATORY_CARE_PROVIDER_SITE_OTHER): Payer: PPO | Admitting: Family Medicine

## 2015-12-28 ENCOUNTER — Encounter: Payer: Self-pay | Admitting: Family Medicine

## 2015-12-28 VITALS — BP 100/70 | HR 82 | Temp 98.2°F | Ht 70.5 in | Wt 241.5 lb

## 2015-12-28 DIAGNOSIS — E785 Hyperlipidemia, unspecified: Secondary | ICD-10-CM | POA: Diagnosis not present

## 2015-12-28 DIAGNOSIS — K7581 Nonalcoholic steatohepatitis (NASH): Secondary | ICD-10-CM | POA: Diagnosis not present

## 2015-12-28 DIAGNOSIS — I10 Essential (primary) hypertension: Secondary | ICD-10-CM | POA: Diagnosis not present

## 2015-12-28 DIAGNOSIS — E1140 Type 2 diabetes mellitus with diabetic neuropathy, unspecified: Secondary | ICD-10-CM

## 2015-12-28 DIAGNOSIS — E1165 Type 2 diabetes mellitus with hyperglycemia: Secondary | ICD-10-CM

## 2015-12-28 DIAGNOSIS — IMO0002 Reserved for concepts with insufficient information to code with codable children: Secondary | ICD-10-CM

## 2015-12-28 NOTE — Progress Notes (Signed)
Pre visit review using our clinic review tool, if applicable. No additional management support is needed unless otherwise documented below in the visit note. 
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
## 2015-12-28 NOTE — Patient Instructions (Signed)
BEFORE YOU LEAVE: -nurse visit in 2 weeks to check blood pressure -follow up in 4 months - plan labs then  Monitor Blood pressure at home for a few days - check after sitting for 5 minutes. If consistently running low < 100/< 60 - please decrease Tenoretic (atenolol-hctz to 1/2 tablet daily) Re-check with Wendie Simmer in 2 weeks  We recommend the following healthy lifestyle measures: - eat a healthy whole foods diet consisting of regular small meals composed of vegetables, fruits, beans, nuts, seeds, healthy meats such as white chicken and fish and whole grains.  - avoid sweets, white starchy foods, fried foods, fast food, processed foods, sodas, red meet and other fattening foods.  - get a least 150-300 minutes of aerobic exercise per week.

## 2015-12-29 ENCOUNTER — Telehealth: Payer: Self-pay | Admitting: Family Medicine

## 2015-12-29 NOTE — Telephone Encounter (Signed)
Daughter would like to know if Dr Maudie Mercury will call in a med for pt. Pt has chills, body ache, flu like symptoms. Pt seen yesterday and is not any better. Feels worse.   [facility]/ [address]

## 2015-12-29 NOTE — Telephone Encounter (Signed)
I called the pts daughter and informed her of the message below and offered an appt for tomorrow at 9am.  She relayed this to the pt and he stated he was not coming in tomorrow and I advised she call us back if he changes his mind and she agreed.

## 2015-12-29 NOTE — Telephone Encounter (Signed)
Please check on patient. We saw him for routine follow-up yesterday, at the time he did not have a fever or flulike symptoms reported at the time. Would advise prompt evaluation if he is feeling sick and feels the medication. We could see him tomorrow morning if needed.

## 2016-01-07 ENCOUNTER — Encounter: Payer: Self-pay | Admitting: Gastroenterology

## 2016-01-08 ENCOUNTER — Other Ambulatory Visit: Payer: Self-pay | Admitting: Family Medicine

## 2016-01-11 ENCOUNTER — Encounter: Payer: Self-pay | Admitting: *Deleted

## 2016-01-11 ENCOUNTER — Ambulatory Visit (INDEPENDENT_AMBULATORY_CARE_PROVIDER_SITE_OTHER): Payer: PPO | Admitting: *Deleted

## 2016-01-11 ENCOUNTER — Telehealth: Payer: Self-pay | Admitting: *Deleted

## 2016-01-11 VITALS — BP 100/76 | HR 63

## 2016-01-11 DIAGNOSIS — I10 Essential (primary) hypertension: Secondary | ICD-10-CM

## 2016-01-11 NOTE — Addendum Note (Signed)
Addended by: Agnes Lawrence on: 01/11/2016 10:36 AM   Modules accepted: Level of Service

## 2016-01-11 NOTE — Telephone Encounter (Signed)
Patient came in to the office today for a blood pressure check with me and asked if Dr Maudie Mercury could give him a handicapped placard as he needs this for his knee pain.  I informed the pt Dr Maudie Mercury approved a 3 month handicapped placard and advised the pt contact his orthopedic doctor for this in the future and he agreed.

## 2016-01-11 NOTE — Progress Notes (Signed)
Here for a nurse visit to recheck blood pressure. Blood pressure remains on the low side and he occasionally experiences lightheaded sensation when standing up. He wishes to try to decrease Tenoretic to one half tablet daily. Follow-up in 1 month. He also requested a handicap decal from our assisted for his knee issues. Completed temporary decal application.

## 2016-01-12 ENCOUNTER — Telehealth: Payer: Self-pay | Admitting: Family Medicine

## 2016-01-12 NOTE — Telephone Encounter (Signed)
Dr Maudie Mercury was informed of the message and asked that I call the pt to see how he was feeling and if he was feeling worse with the dizziness or the same as he felt before and if he is worse to go to an urgent care since we do not have any openings here today.  I called the pt and he stated he was driving and felt dizzy and this feels the same as he did before, not worse.  I advised him Dr Maudie Mercury stated the reading is low but not too terribly bad and it can take up to a week to notice a change in the way he feels after decreasing the Atenolol.  He stated he has always taken only 53m of the blood pressure pill and I advised him he should continue the 547mof Amlodipine, but the medication he is to take 1/2 of is the Atenolol 100/25.  He stated he will look at his medications and begin taking only half and I advised him to go to an urgent care if needed or call me back tomorrow if he needs to be seen here and he agreed.

## 2016-01-12 NOTE — Telephone Encounter (Signed)
Please advise 

## 2016-01-12 NOTE — Telephone Encounter (Signed)
°  Patient Name: John Hobbs  DOB: July 28, 1946    Initial Comment Caller states his BP 93/68. Dizzy.   Nurse Assessment  Nurse: Leilani Merl, RN, Heather Date/Time (Eastern Time): 01/12/2016 1:24:40 PM  Confirm and document reason for call. If symptomatic, describe symptoms. You must click the next button to save text entered. ---Caller states his BP 93/68 today, he took it again and it was 101/65 . Dizzy, he is still dizzy a little.  Has the patient traveled out of the country within the last 30 days? ---Not Applicable  Does the patient have any new or worsening symptoms? ---Yes  Will a triage be completed? ---Yes  Related visit to physician within the last 2 weeks? ---Yes  Does the PT have any chronic conditions? (i.e. diabetes, asthma, etc.) ---Yes  List chronic conditions. ---See MR  Is this a behavioral health or substance abuse call? ---No     Guidelines    Guideline Title Affirmed Question Affirmed Notes  Low Blood Pressure Patient sounds very sick or weak to the triager    Final Disposition User   Go to ED Now (or PCP triage) Stand[place], RN, Heather    Comments  Caller was seen yesterday for low blood pressure and was told to half his blood pressure medication. Will notify triage nurse at the office through [facility] to FU with patient.   Disagree/Comply: Comply

## 2016-02-08 ENCOUNTER — Encounter: Payer: Self-pay | Admitting: Family Medicine

## 2016-02-10 ENCOUNTER — Ambulatory Visit (AMBULATORY_SURGERY_CENTER): Payer: Self-pay | Admitting: *Deleted

## 2016-02-10 VITALS — Ht 70.0 in | Wt 246.8 lb

## 2016-02-10 DIAGNOSIS — Z86010 Personal history of colonic polyps: Secondary | ICD-10-CM

## 2016-02-10 NOTE — Progress Notes (Signed)
Patient denies any allergies to egg or soy products. Patient denies complications with anesthesia/sedation.  Patient denies oxygen use at home and denies diet medications. Emmi instructions for colonoscopy but patient denied.  Pamphlet given.

## 2016-02-11 ENCOUNTER — Encounter: Payer: Self-pay | Admitting: Family Medicine

## 2016-02-11 ENCOUNTER — Ambulatory Visit (INDEPENDENT_AMBULATORY_CARE_PROVIDER_SITE_OTHER): Payer: PPO | Admitting: Family Medicine

## 2016-02-11 VITALS — BP 100/80 | HR 74 | Temp 98.2°F | Ht 70.0 in | Wt 246.7 lb

## 2016-02-11 DIAGNOSIS — I10 Essential (primary) hypertension: Secondary | ICD-10-CM | POA: Diagnosis not present

## 2016-02-11 NOTE — Patient Instructions (Signed)
Chief your follow-up as scheduled in July.  Continue your medications.  We recommend the following healthy lifestyle measures: - eat a healthy whole foods diet consisting of regular small meals composed of vegetables, fruits, beans, nuts, seeds, healthy meats such as white chicken and fish and whole grains.  - avoid sweets, white starchy foods, fried foods, fast food, processed foods, sodas, red meet and other fattening foods.  - get a least 150-300 minutes of aerobic exercise per week.
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
## 2016-02-11 NOTE — Progress Notes (Signed)
Pre visit review using our clinic review tool, if applicable. No additional management support is needed unless otherwise documented below in the visit note. 

## 2016-02-15 ENCOUNTER — Encounter: Payer: Self-pay | Admitting: Gastroenterology

## 2016-02-22 ENCOUNTER — Ambulatory Visit (INDEPENDENT_AMBULATORY_CARE_PROVIDER_SITE_OTHER): Payer: PPO | Admitting: Endocrinology

## 2016-02-22 ENCOUNTER — Encounter: Payer: Self-pay | Admitting: Endocrinology

## 2016-02-22 VITALS — BP 134/90 | HR 65 | Ht 70.0 in | Wt 248.4 lb

## 2016-02-22 DIAGNOSIS — E1310 Other specified diabetes mellitus with ketoacidosis without coma: Secondary | ICD-10-CM

## 2016-02-22 LAB — POCT GLYCOSYLATED HEMOGLOBIN (HGB A1C): HEMOGLOBIN A1C: 7.5
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
## 2016-02-22 NOTE — Patient Instructions (Addendum)
check your blood sugar once a day.  vary the time of day when you check, between before the 3 meals, and at bedtime.  also check if you have symptoms of your blood sugar being too high or too low.  please keep a record of the readings and bring it to your next appointment here (or you can bring the meter itself).  You can write it on any piece of paper.  please call us sooner if your blood sugar goes below 70, or if you have a lot of readings over 200. Please continue the same medications Our goals are: 1.  Improve your a1c.  2.  Avoid low blood sugar.  3.  Stay with generic meds if we can. Please come back for a follow-up appointment in 3-4 months.

## 2016-02-24 ENCOUNTER — Ambulatory Visit (AMBULATORY_SURGERY_CENTER): Payer: PPO | Admitting: Gastroenterology

## 2016-02-24 ENCOUNTER — Encounter: Payer: Self-pay | Admitting: Gastroenterology

## 2016-02-24 VITALS — BP 130/85 | HR 73 | Temp 98.0°F | Resp 14 | Ht 70.0 in | Wt 246.0 lb

## 2016-02-24 DIAGNOSIS — Z1211 Encounter for screening for malignant neoplasm of colon: Secondary | ICD-10-CM

## 2016-02-24 DIAGNOSIS — Z86010 Personal history of colonic polyps: Secondary | ICD-10-CM

## 2016-02-24 LAB — GLUCOSE, CAPILLARY
GLUCOSE-CAPILLARY: 225 mg/dL — AB (ref 65–99)
Glucose-Capillary: 207 mg/dL — ABNORMAL HIGH (ref 65–99)

## 2016-02-24 MED ORDER — SODIUM CHLORIDE 0.9 % IV SOLN: 500.0000 mL | INTRAVENOUS | Status: DC

## 2016-02-24 NOTE — Patient Instructions (Signed)
YOU HAD AN ENDOSCOPIC PROCEDURE TODAY AT [facility]:   Refer to the procedure report that was given to you for any specific questions about what was found during the examination.  If the procedure report does not answer your questions, please call your gastroenterologist to clarify.  If you requested that your care partner not be given the details of your procedure findings, then the procedure report has been included in a sealed envelope for you to review at your convenience later.  YOU SHOULD EXPECT: Some feelings of bloating in the abdomen. Passage of more gas than usual.  Walking can help get rid of the air that was put into your GI tract during the procedure and reduce the bloating. If you had a lower endoscopy (such as a colonoscopy or flexible sigmoidoscopy) you may notice spotting of blood in your stool or on the toilet paper. If you underwent a bowel prep for your procedure, you may not have a normal bowel movement for a few days.  Please Note:  You might notice some irritation and congestion in your nose or some drainage.  This is from the oxygen used during your procedure.  There is no need for concern and it should clear up in a day or so.  SYMPTOMS TO REPORT IMMEDIATELY:   Following lower endoscopy (colonoscopy or flexible sigmoidoscopy):  Excessive amounts of blood in the stool  Significant tenderness or worsening of abdominal pains  Swelling of the abdomen that is new, acute  Fever of 100F or higher  For urgent or emergent issues, a gastroenterologist can be reached at any hour by calling [phone].  DIET: Your first meal following the procedure should be a small meal and then it is ok to progress to your normal diet. Heavy or fried foods are harder to digest and may make you feel nauseous or bloated.  Likewise, meals heavy in dairy and vegetables can increase bloating.  Drink plenty of fluids but you should avoid alcoholic beverages for 24 hours.  ACTIVITY:   You should plan to take it easy for the rest of today and you should NOT DRIVE or use heavy machinery until tomorrow (because of the sedation medicines used during the test).    FOLLOW UP: Our staff will call the number listed on your records the next business day following your procedure to check on you and address any questions or concerns that you may have regarding the information given to you following your procedure. If we do not reach you, we will leave a message.  However, if you are feeling well and you are not experiencing any problems, there is no need to return our call.  We will assume that you have returned to your regular daily activities without incident.  SIGNATURES/CONFIDENTIALITY: You and/or your care partner have signed paperwork which will be entered into your electronic medical record.  These signatures attest to the fact that that the information above on your After Visit Summary has been reviewed and is understood.  Full responsibility of the confidentiality of this discharge information lies with you and/or your care-partner.  Please read over handouts about diverticulosis and high fiber diets  Next colonoscopy- 10 years  Please continue your normal medications

## 2016-02-24 NOTE — Op Note (Signed)
[facility] Patient Name: John Hobbs Procedure Date: 02/24/2016 9:29 AM MRN: [id number] Endoscopist: Milus Banister , MD Age: 70 Date of Birth: 07-Jul-1946 Gender: Male Procedure:                Colonoscopy Indications:              Screening for colorectal malignant neoplasm                            (colonoscopy 2007 Dr. Ardis Hughs with hyperplastic                            polyp only) Medicines:                Monitored Anesthesia Care Procedure:                Pre-Anesthesia Assessment:                           - Prior to the procedure, a History and Physical                            was performed, and patient medications and                            allergies were reviewed. The patient's tolerance of                            previous anesthesia was also reviewed. The risks                            and benefits of the procedure and the sedation                            options and risks were discussed with the patient.                            All questions were answered, and informed consent                            was obtained. Prior Anticoagulants: The patient has                            taken no previous anticoagulant or antiplatelet                            agents. ASA Grade Assessment: II - A patient with                            mild systemic disease. After reviewing the risks                            and benefits, the patient was deemed in  satisfactory condition to undergo the procedure.                           After obtaining informed consent, the colonoscope                            was passed under direct vision. Throughout the                            procedure, the patient's blood pressure, pulse, and                            oxygen saturations were monitored continuously. The                            Model CF-HQ190L [id number]) scope was introduced                            through the anus  and advanced to the the cecum,                            identified by appendiceal orifice and ileocecal                            valve. The colonoscopy was performed without                            difficulty. The patient tolerated the procedure                            well. The quality of the bowel preparation was                            good. The ileocecal valve, appendiceal orifice, and                            rectum were photographed. Scope In: 9:47:34 AM Scope Out: 10:00:00 AM Scope Withdrawal Time: 0 hours 7 minutes 54 seconds  Total Procedure Duration: 0 hours 12 minutes 26 seconds  Findings:                 Multiple small and large-mouthed diverticula were                            found in the left colon.                           The exam was otherwise without abnormality on                            direct and retroflexion views. Complications:            No immediate complications. Estimated blood loss:                            None.  Estimated Blood Loss:     Estimated blood loss: none. Impression:               - Diverticulosis in the left colon.                           - The examination was otherwise normal on direct                            and retroflexion views.                           - No specimens collected. Recommendation:           - Patient has a contact number available for                            emergencies. The signs and symptoms of potential                            delayed complications were discussed with the                            patient. Return to normal activities tomorrow.                            Written discharge instructions were provided to the                            patient.                           - Resume previous diet.                           - Continue present medications.                           - Repeat colonoscopy in 10 years for screening                            purposes. There is no need  for colon cancer                            screening by any method (including stool testing)                            prior to then. Milus Banister, MD 02/24/2016 10:02:37 AM This report has been signed electronically.

## 2016-02-25 ENCOUNTER — Telehealth: Payer: Self-pay | Admitting: *Deleted

## 2016-02-25 NOTE — Telephone Encounter (Signed)
  Follow up Call-  Call back number 02/24/2016  Post procedure Call Back phone  # [phone]  Permission to leave phone message Yes     Patient questions:  Do you have a fever, pain , or abdominal swelling? No. Pain Score  0 *  Have you tolerated food without any problems? Yes.    Have you been able to return to your normal activities? Yes.    Do you have any questions about your discharge instructions: Diet   No. Medications  No. Follow up visit  No.  Do you have questions or concerns about your Care? No.  Actions: * If pain score is 4 or above: No action needed, pain <4.

## 2016-03-01 ENCOUNTER — Telehealth: Payer: Self-pay | Admitting: *Deleted

## 2016-03-01 NOTE — Telephone Encounter (Signed)
Patient's daughter Freddi Starr [phone]) called stating the pharmacy told her we needed to call them in order for the pt to receive his Rx for Metformin.  I called [facility] and spoke with Tavione and he stated the Rx is out of stock and should be in tomorrow and they will call him when this comes in.  I called the pt and informed his wife of this.

## 2016-03-10 ENCOUNTER — Other Ambulatory Visit: Payer: Self-pay | Admitting: Family Medicine

## 2016-03-10 DIAGNOSIS — I10 Essential (primary) hypertension: Secondary | ICD-10-CM

## 2016-03-10 MED ORDER — ATORVASTATIN CALCIUM 20 MG PO TABS: ORAL_TABLET | ORAL | Status: DC

## 2016-03-16 ENCOUNTER — Other Ambulatory Visit: Payer: Self-pay

## 2016-03-16 MED ORDER — LOSARTAN POTASSIUM 100 MG PO TABS: ORAL_TABLET | ORAL | Status: DC

## 2016-04-26 ENCOUNTER — Encounter: Payer: Self-pay | Admitting: Family Medicine

## 2016-04-26 ENCOUNTER — Ambulatory Visit (INDEPENDENT_AMBULATORY_CARE_PROVIDER_SITE_OTHER): Payer: PPO | Admitting: Family Medicine

## 2016-04-26 VITALS — BP 130/84 | HR 76 | Temp 98.8°F | Ht 70.0 in | Wt 259.1 lb

## 2016-04-26 DIAGNOSIS — E785 Hyperlipidemia, unspecified: Secondary | ICD-10-CM

## 2016-04-26 DIAGNOSIS — IMO0002 Reserved for concepts with insufficient information to code with codable children: Secondary | ICD-10-CM

## 2016-04-26 DIAGNOSIS — I10 Essential (primary) hypertension: Secondary | ICD-10-CM

## 2016-04-26 DIAGNOSIS — K7581 Nonalcoholic steatohepatitis (NASH): Secondary | ICD-10-CM | POA: Diagnosis not present

## 2016-04-26 DIAGNOSIS — E1140 Type 2 diabetes mellitus with diabetic neuropathy, unspecified: Secondary | ICD-10-CM

## 2016-04-26 DIAGNOSIS — E1165 Type 2 diabetes mellitus with hyperglycemia: Secondary | ICD-10-CM

## 2016-04-26 NOTE — Progress Notes (Signed)
Pre visit review using our clinic review tool, if applicable. No additional management support is needed unless otherwise documented below in the visit note. 
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
## 2016-04-26 NOTE — Patient Instructions (Addendum)
BEFORE YOU LEAVE: -follow up: physical exam in 3-4 month - will plan to do labs then  Schedule you eye exam.  We recommend the following healthy lifestyle: 1) Eat a healthy clean diet with avoidance of (less then 1 serving per week) processed foods, sweetened drinks, white starches, red meat, fast foods and sweets and consisting of: * 5-9 servings per day of fresh or frozen fruits and vegetables (not corn or potatoes, not dried or canned) *nuts and seeds, beans *olives and olive oil *small portions of lean meats such as fish and white chicken  *small portions of whole grains -Get at least 150 minutes of sweaty aerobic exercise per week -reduce stress - counseling, meditation, relaxation to balance other aspects of your life  Monitor the lesion on the toe - take good care of your feet. Follow up immediately if worsening or if not resolved in 2 weeks.

## 2016-05-05 ENCOUNTER — Other Ambulatory Visit: Payer: Self-pay | Admitting: General Practice

## 2016-05-05 MED ORDER — AMLODIPINE BESYLATE 5 MG PO TABS: ORAL_TABLET | ORAL | Status: DC

## 2016-05-09 ENCOUNTER — Other Ambulatory Visit: Payer: Self-pay | Admitting: *Deleted

## 2016-05-09 DIAGNOSIS — IMO0002 Reserved for concepts with insufficient information to code with codable children: Secondary | ICD-10-CM

## 2016-05-09 DIAGNOSIS — E1140 Type 2 diabetes mellitus with diabetic neuropathy, unspecified: Secondary | ICD-10-CM

## 2016-05-09 DIAGNOSIS — E1165 Type 2 diabetes mellitus with hyperglycemia: Principal | ICD-10-CM

## 2016-05-09 MED ORDER — METFORMIN HCL ER (OSM) 1000 MG PO TB24: 2000.0000 mg | ORAL_TABLET | Freq: Every day | ORAL | 5 refills | Status: DC

## 2016-05-10 ENCOUNTER — Other Ambulatory Visit: Payer: Self-pay | Admitting: *Deleted

## 2016-05-10 MED ORDER — METFORMIN HCL 1000 MG PO TABS: 1000.0000 mg | ORAL_TABLET | Freq: Two times a day (BID) | ORAL | 3 refills | Status: DC

## 2016-05-10 NOTE — Telephone Encounter (Signed)
Patient's wife called and left a message on my voicemail stating the pts Rx for Metformin is no longer covered as she was told he needed the Metformin HCL and not the ER.  I called the pharmacy and spoke with Anson Crofts and she stated it must be the pts insurance as when she runs this through the cost is $300.   Dr Maudie Mercury was informed of this and stated she believes the insurance may no longer cover the extended release version of his medication and to send in the regular Metformin to now take twice a day.  I informed the pts wife of this and she is aware the new Rx was sent to his pharmacy.

## 2016-06-03 ENCOUNTER — Ambulatory Visit: Payer: PPO | Admitting: Endocrinology

## 2016-06-03 DIAGNOSIS — Z0289 Encounter for other administrative examinations: Secondary | ICD-10-CM

## 2016-07-11 DIAGNOSIS — E119 Type 2 diabetes mellitus without complications: Secondary | ICD-10-CM | POA: Diagnosis not present

## 2016-07-11 DIAGNOSIS — H524 Presbyopia: Secondary | ICD-10-CM | POA: Diagnosis not present

## 2016-07-11 LAB — HM DIABETES EYE EXAM

## 2016-07-18 ENCOUNTER — Other Ambulatory Visit: Payer: Self-pay | Admitting: *Deleted

## 2016-07-18 MED ORDER — ATORVASTATIN CALCIUM 20 MG PO TABS: ORAL_TABLET | ORAL | 5 refills | Status: DC

## 2016-07-18 NOTE — Telephone Encounter (Signed)
Rx done. 

## 2016-08-02 ENCOUNTER — Other Ambulatory Visit: Payer: Self-pay | Admitting: *Deleted

## 2016-08-02 DIAGNOSIS — I10 Essential (primary) hypertension: Secondary | ICD-10-CM

## 2016-08-02 MED ORDER — ATENOLOL-CHLORTHALIDONE 100-25 MG PO TABS: ORAL_TABLET | ORAL | 5 refills | Status: DC

## 2016-08-02 NOTE — Telephone Encounter (Signed)
Rx done. 

## 2016-08-18 ENCOUNTER — Encounter: Payer: Self-pay | Admitting: Family Medicine

## 2016-08-28 ENCOUNTER — Other Ambulatory Visit: Payer: Self-pay | Admitting: Family Medicine

## 2016-09-09 ENCOUNTER — Encounter: Payer: PPO | Admitting: Family Medicine

## 2016-09-18 ENCOUNTER — Other Ambulatory Visit: Payer: Self-pay | Admitting: Family Medicine

## 2016-09-23 ENCOUNTER — Encounter: Payer: Self-pay | Admitting: Family Medicine

## 2016-09-23 ENCOUNTER — Ambulatory Visit (INDEPENDENT_AMBULATORY_CARE_PROVIDER_SITE_OTHER): Payer: PPO | Admitting: Family Medicine

## 2016-09-23 VITALS — BP 118/82 | HR 79 | Temp 98.3°F | Ht 70.0 in | Wt 269.7 lb

## 2016-09-23 DIAGNOSIS — E1165 Type 2 diabetes mellitus with hyperglycemia: Secondary | ICD-10-CM | POA: Diagnosis not present

## 2016-09-23 DIAGNOSIS — E1140 Type 2 diabetes mellitus with diabetic neuropathy, unspecified: Secondary | ICD-10-CM

## 2016-09-23 DIAGNOSIS — Z0000 Encounter for general adult medical examination without abnormal findings: Secondary | ICD-10-CM

## 2016-09-23 DIAGNOSIS — I10 Essential (primary) hypertension: Secondary | ICD-10-CM

## 2016-09-23 DIAGNOSIS — IMO0002 Reserved for concepts with insufficient information to code with codable children: Secondary | ICD-10-CM

## 2016-09-23 DIAGNOSIS — E785 Hyperlipidemia, unspecified: Secondary | ICD-10-CM | POA: Diagnosis not present

## 2016-09-23 DIAGNOSIS — Z23 Encounter for immunization: Secondary | ICD-10-CM | POA: Diagnosis not present

## 2016-09-23 DIAGNOSIS — K7581 Nonalcoholic steatohepatitis (NASH): Secondary | ICD-10-CM | POA: Diagnosis not present

## 2016-09-23 DIAGNOSIS — H9193 Unspecified hearing loss, bilateral: Secondary | ICD-10-CM

## 2016-09-23 DIAGNOSIS — Z6838 Body mass index (BMI) 38.0-38.9, adult: Secondary | ICD-10-CM

## 2016-09-23 LAB — BASIC METABOLIC PANEL
BUN: 16 mg/dL (ref 6–23)
CO2: 30 meq/L (ref 19–32)
Calcium: 9.7 mg/dL (ref 8.4–10.5)
Chloride: 105 mEq/L (ref 96–112)
Creatinine, Ser: 1.09 mg/dL (ref 0.40–1.50)
GFR: 86.52 mL/min (ref >60.00)
GLUCOSE: 138 mg/dL — AB (ref 70–99)
POTASSIUM: 3.7 meq/L (ref 3.5–5.1)
SODIUM: 141 meq/L (ref 135–145)

## 2016-09-23 LAB — CBC
HEMATOCRIT: 40.5 % (ref 39.0–52.0)
HEMOGLOBIN: 13.3 g/dL (ref 13.0–17.0)
MCHC: 32.8 g/dL (ref 30.0–36.0)
MCV: 87.6 fl (ref 78.0–100.0)
PLATELETS: 220 10*3/uL (ref 150.0–400.0)
RBC: 4.62 Mil/uL (ref 4.22–5.81)
RDW: 15.1 % (ref 11.5–15.5)
WBC: 5.9 10*3/uL (ref 4.0–10.5)

## 2016-09-23 LAB — HEMOGLOBIN A1C: Hgb A1c MFr Bld: 6.7 % — ABNORMAL HIGH (ref 4.6–6.5)

## 2016-09-23 NOTE — Patient Instructions (Signed)
BEFORE YOU LEAVE: -flu shot -labs -see if [name] or Sharyn Lull available for advanced directives, if not please provide brochure -follow up: 3-4 months  Please see a lawyer and/or go to this website to help you with advanced directives and designating a health care power of attorney so that your wishes will be followed should you become too ill to make your own medical decisions. [email or web address]   We have ordered labs or studies at this visit. It can take up to 1-2 weeks for results and processing. IF results require follow up or explanation, we will call you with instructions. Clinically stable results will be released to your [facility]. If you have not heard from [place] or cannot find your results in [facility] in 2 weeks please contact our office at [phone].  If you are not yet signed up for [facility], please consider signing up.   We recommend the following healthy lifestyle for LIFE: 1) Small portions.   Tip: eat off of a salad plate instead of a dinner plate.  Tip: It is ok to feel hungry after a meal of proper portion sizes  Tip: if you need more or a snack choose fruits, veggies and/or a handful of nuts or seeds.  2) Eat a healthy clean diet.  * Tip: Avoid (less then 1 serving per week): processed foods, sweets, sweetened drinks, white starches (rice, flour, bread, potatoes, pasta, etc), red meat, fast foods, butter  *Tip: CHOOSE instead   * 5-9 servings per day of fresh or frozen fruits and vegetables (but not corn, potatoes, bananas, canned or dried fruit)   *nuts and seeds, beans   *olives and olive oil   *small portions of lean meats such as fish and white chicken    *small portions of whole grains  3)Get at least 150 minutes of sweaty aerobic exercise per week.  4)Reduce stress - consider counseling, meditation and relaxation to balance other aspects of your life.
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
## 2016-09-23 NOTE — Progress Notes (Signed)
Pre visit review using our clinic review tool, if applicable. No additional management support is needed unless otherwise documented below in the visit note. 

## 2016-09-23 NOTE — Progress Notes (Signed)
Review of advanced directive  Advanced Directive; Reviewed advanced directive and agreed to receipt of information and discussion.  Focused face to face x  20 minutes discussing HCPOA and Living will and reviewed all the questions in the [place] forms. The patient voices understanding of HCPOA; LW reviewed and information provided on each question. Educated on how to revoke this HCPOA or LW at any time.   Also  discussed life prolonging measures (given a few examples) and where he could choose to initiate or not;  the ability to given the HCPOA power to change his living will or not if he cannot speak for himself; as well as finalizing the will by 2 unrelated witnesses and notary.  Will call for questions and given information on [facility] pastoral department for further assistance.   Wynetta Fines RN clinical health coach/ VO dr. Maudie Mercury
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
## 2016-12-26 ENCOUNTER — Ambulatory Visit: Payer: PPO | Admitting: Family Medicine

## 2017-01-09 ENCOUNTER — Other Ambulatory Visit: Payer: Self-pay | Admitting: Endocrinology

## 2017-01-09 NOTE — Telephone Encounter (Signed)
Please refill x 1 Ov is due  

## 2017-01-18 ENCOUNTER — Telehealth: Payer: Self-pay | Admitting: Family Medicine

## 2017-01-18 ENCOUNTER — Other Ambulatory Visit: Payer: Self-pay | Admitting: Endocrinology

## 2017-01-18 ENCOUNTER — Other Ambulatory Visit: Payer: Self-pay | Admitting: Family Medicine

## 2017-01-18 NOTE — Telephone Encounter (Signed)
Patient Instructions  BEFORE YOU LEAVE: -flu shot -labs -see if [name] or Sharyn Lull available for advanced directives, if not please provide brochure -follow up: 3-4 months  Message sent to scheduling to help pt make follow up appt.  Rx sent to the pharmacy for 30 days.  No recent lipid panel.  Last on 11/30/15 and was abnormal.

## 2017-01-18 NOTE — Telephone Encounter (Signed)
Pt now due for a follow up appointment with Dr. Maudie Mercury.  Please help him to schedule.  Thanks!!
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
## 2017-01-24 ENCOUNTER — Ambulatory Visit (INDEPENDENT_AMBULATORY_CARE_PROVIDER_SITE_OTHER): Payer: PPO | Admitting: Family Medicine

## 2017-01-24 ENCOUNTER — Encounter: Payer: Self-pay | Admitting: Family Medicine

## 2017-01-24 VITALS — BP 104/80 | HR 66 | Temp 97.9°F | Ht 70.0 in | Wt 277.5 lb

## 2017-01-24 DIAGNOSIS — E1159 Type 2 diabetes mellitus with other circulatory complications: Secondary | ICD-10-CM | POA: Diagnosis not present

## 2017-01-24 DIAGNOSIS — K7581 Nonalcoholic steatohepatitis (NASH): Secondary | ICD-10-CM | POA: Diagnosis not present

## 2017-01-24 DIAGNOSIS — E6609 Other obesity due to excess calories: Secondary | ICD-10-CM

## 2017-01-24 DIAGNOSIS — E119 Type 2 diabetes mellitus without complications: Secondary | ICD-10-CM | POA: Diagnosis not present

## 2017-01-24 DIAGNOSIS — I152 Hypertension secondary to endocrine disorders: Secondary | ICD-10-CM

## 2017-01-24 DIAGNOSIS — E1169 Type 2 diabetes mellitus with other specified complication: Secondary | ICD-10-CM

## 2017-01-24 DIAGNOSIS — I10 Essential (primary) hypertension: Secondary | ICD-10-CM

## 2017-01-24 DIAGNOSIS — E785 Hyperlipidemia, unspecified: Secondary | ICD-10-CM | POA: Diagnosis not present

## 2017-01-24 DIAGNOSIS — Z6839 Body mass index (BMI) 39.0-39.9, adult: Secondary | ICD-10-CM | POA: Diagnosis not present

## 2017-01-24 DIAGNOSIS — IMO0001 Reserved for inherently not codable concepts without codable children: Secondary | ICD-10-CM

## 2017-01-24 LAB — COMPREHENSIVE METABOLIC PANEL
ALBUMIN: 4.2 g/dL (ref 3.5–5.2)
ALK PHOS: 35 U/L — AB (ref 39–117)
ALT: 16 U/L (ref 0–53)
AST: 18 U/L (ref 0–37)
BUN: 22 mg/dL (ref 6–23)
CO2: 30 mEq/L (ref 19–32)
Calcium: 9.4 mg/dL (ref 8.4–10.5)
Chloride: 104 mEq/L (ref 96–112)
Creatinine, Ser: 1.06 mg/dL (ref 0.40–1.50)
GFR: 89.26 mL/min (ref >60.00)
Glucose, Bld: 151 mg/dL — ABNORMAL HIGH (ref 70–99)
Potassium: 3.7 mEq/L (ref 3.5–5.1)
SODIUM: 140 meq/L (ref 135–145)
Total Bilirubin: 0.4 mg/dL (ref 0.2–1.2)
Total Protein: 7 g/dL (ref 6.0–8.3)

## 2017-01-24 LAB — CBC
HEMATOCRIT: 42.4 % (ref 39.0–52.0)
Hemoglobin: 13.3 g/dL (ref 13.0–17.0)
MCHC: 31.4 g/dL (ref 30.0–36.0)
MCV: 89 fl (ref 78.0–100.0)
PLATELETS: 232 10*3/uL (ref 150.0–400.0)
RBC: 4.76 Mil/uL (ref 4.22–5.81)
RDW: 14.8 % (ref 11.5–15.5)
WBC: 6 10*3/uL (ref 4.0–10.5)

## 2017-01-24 LAB — LIPID PANEL
CHOLESTEROL: 115 mg/dL (ref 0–200)
HDL: 39 mg/dL — ABNORMAL LOW (ref >39.00)
LDL CALC: 62 mg/dL (ref 0–99)
NonHDL: 76.32
TRIGLYCERIDES: 72 mg/dL (ref 0.0–149.0)
Total CHOL/HDL Ratio: 3
VLDL: 14.4 mg/dL (ref 0.0–40.0)

## 2017-01-24 LAB — HEMOGLOBIN A1C: HEMOGLOBIN A1C: 7.3 % — AB (ref 4.6–6.5)

## 2017-01-24 NOTE — Progress Notes (Signed)
Pre visit review using our clinic review tool, if applicable. No additional management support is needed unless otherwise documented below in the visit note. 

## 2017-01-24 NOTE — Patient Instructions (Addendum)
BEFORE YOU LEAVE: -follow up: 3 months -labs  We have ordered labs or studies at this visit. It can take up to 1-2 weeks for results and processing. IF results require follow up or explanation, we will call you with instructions. Clinically stable results will be released to your [facility]. If you have not heard from [place] or cannot find your results in [facility] in 2 weeks please contact our office at [phone].  If you are not yet signed up for [facility], please consider signing up.   We recommend the following healthy lifestyle for LIFE: 1) Small portions.   Tip: eat off of a salad plate instead of a dinner plate.  Tip: It is ok to feel hungry after a meal of proper portion sizes.  Tip: if you need more or a snack choose fruits, veggies and/or a handful of nuts or seeds.  2) Eat a healthy clean diet.  * Tip: Avoid (less then 1 serving per week): processed foods, sweets, sweetened drinks, white starches (rice, flour, bread, potatoes, pasta, etc), red meat, fast foods, butter  *Tip: CHOOSE instead   * 5-9 servings per day of fresh or frozen fruits and vegetables (but not corn, potatoes, bananas, canned or dried fruit)   *nuts and seeds, beans   *olives and olive oil   *small portions of lean meats such as fish and white chicken    *small portions of whole grains  3)Get at least 150 minutes of sweaty aerobic exercise per week.  4)Reduce stress - consider counseling, meditation and relaxation to balance other aspects of your life.   WE NOW OFFER   [facility] FAST TRACK!!!  SAME DAY Appointments for ACUTE CARE  Such as: Sprains, Injuries, cuts, abrasions, rashes, muscle pain, joint pain, back pain Colds, flu, sore throats, headache, allergies, cough, fever  Ear pain, sinus and eye infections Abdominal pain, nausea, vomiting, diarrhea, upset stomach Animal/insect bites  3 Easy Ways to Schedule: Walk-In Scheduling Call in scheduling Mychart Sign-up:  [email or web address]

## 2017-02-14 ENCOUNTER — Other Ambulatory Visit: Payer: Self-pay | Admitting: Endocrinology

## 2017-02-15 ENCOUNTER — Other Ambulatory Visit: Payer: Self-pay | Admitting: Endocrinology

## 2017-02-15 ENCOUNTER — Other Ambulatory Visit: Payer: Self-pay | Admitting: Family Medicine

## 2017-03-15 ENCOUNTER — Other Ambulatory Visit: Payer: Self-pay | Admitting: Family Medicine

## 2017-03-15 ENCOUNTER — Other Ambulatory Visit: Payer: Self-pay | Admitting: Endocrinology

## 2017-03-16 ENCOUNTER — Other Ambulatory Visit: Payer: Self-pay | Admitting: Endocrinology

## 2017-04-01 ENCOUNTER — Other Ambulatory Visit: Payer: Self-pay | Admitting: Endocrinology

## 2017-04-01 NOTE — Telephone Encounter (Signed)
Please refill x 3 mos Ov is due

## 2017-04-05 ENCOUNTER — Other Ambulatory Visit: Payer: Self-pay | Admitting: Family Medicine

## 2017-04-21 ENCOUNTER — Other Ambulatory Visit: Payer: Self-pay | Admitting: Family Medicine
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
## 2017-05-04 ENCOUNTER — Other Ambulatory Visit: Payer: Self-pay | Admitting: Endocrinology

## 2017-05-04 ENCOUNTER — Encounter: Payer: Self-pay | Admitting: Family Medicine

## 2017-05-04 ENCOUNTER — Ambulatory Visit (INDEPENDENT_AMBULATORY_CARE_PROVIDER_SITE_OTHER): Payer: PPO | Admitting: Family Medicine

## 2017-05-04 VITALS — BP 118/80 | HR 83 | Temp 97.9°F | Ht 70.0 in | Wt 277.8 lb

## 2017-05-04 DIAGNOSIS — J302 Other seasonal allergic rhinitis: Secondary | ICD-10-CM | POA: Diagnosis not present

## 2017-05-04 DIAGNOSIS — E1165 Type 2 diabetes mellitus with hyperglycemia: Secondary | ICD-10-CM | POA: Diagnosis not present

## 2017-05-04 DIAGNOSIS — E1159 Type 2 diabetes mellitus with other circulatory complications: Secondary | ICD-10-CM

## 2017-05-04 DIAGNOSIS — E1140 Type 2 diabetes mellitus with diabetic neuropathy, unspecified: Secondary | ICD-10-CM | POA: Diagnosis not present

## 2017-05-04 DIAGNOSIS — Z6839 Body mass index (BMI) 39.0-39.9, adult: Secondary | ICD-10-CM

## 2017-05-04 DIAGNOSIS — K7581 Nonalcoholic steatohepatitis (NASH): Secondary | ICD-10-CM

## 2017-05-04 DIAGNOSIS — E669 Obesity, unspecified: Secondary | ICD-10-CM

## 2017-05-04 DIAGNOSIS — IMO0001 Reserved for inherently not codable concepts without codable children: Secondary | ICD-10-CM

## 2017-05-04 DIAGNOSIS — I152 Hypertension secondary to endocrine disorders: Secondary | ICD-10-CM

## 2017-05-04 DIAGNOSIS — E1169 Type 2 diabetes mellitus with other specified complication: Secondary | ICD-10-CM

## 2017-05-04 DIAGNOSIS — E785 Hyperlipidemia, unspecified: Secondary | ICD-10-CM

## 2017-05-04 DIAGNOSIS — IMO0002 Reserved for concepts with insufficient information to code with codable children: Secondary | ICD-10-CM

## 2017-05-04 DIAGNOSIS — I10 Essential (primary) hypertension: Secondary | ICD-10-CM

## 2017-05-04 LAB — HEMOGLOBIN A1C: HEMOGLOBIN A1C: 7.5 % — AB (ref 4.6–6.5)

## 2017-05-04 NOTE — Patient Instructions (Addendum)
BEFORE YOU LEAVE: -follow up: Medicare Exam with [place] with Dr. Maudie Mercury, same day in December (after the 8th) -lab  Can try zyrtec at night for the allergies.  Take good care of feet and apply moisturizer before bed.  We have ordered labs or studies at this visit. It can take up to 1-2 weeks for results and processing. IF results require follow up or explanation, we will call you with instructions. Clinically stable results will be released to your [facility]. If you have not heard from [place] or cannot find your results in [facility] in 2 weeks please contact our office at [phone].  If you are not yet signed up for [facility], please consider signing up.   We recommend the following healthy lifestyle for LIFE: 1) Small portions.   Tip: eat off of a salad plate instead of a dinner plate.  Tip: It is ok to feel hungry after a meal - that likely means you ate an appropriate portion.  Tip: if you need more or a snack choose fruits, veggies and/or a handful of nuts or seeds.  2) Eat a healthy clean diet.   TRY TO EAT: -at least 5-7 servings of low sugar vegetables per day (not corn, potatoes or bananas.) -berries are the best choice if you wish to eat fruit.   -lean meets (fish, chicken or [place] breasts) -vegan proteins for some meals - beans or tofu, whole grains, nuts and seeds -Replace bad fats with good fats - good fats include: fish, nuts and seeds, canola oil, olive oil -small amounts of low fat or non fat dairy -small amounts of100 % whole grains - check the lables  AVOID: -SUGAR, sweets, anything with added sugar, corn syrup or sweeteners -if you must have a sweetener, small amounts of stevia may be best -sweetened beverages -simple starches (rice, bread, potatoes, pasta, chips, etc - small amounts of 100% whole grains are ok) -red meat, pork, butter -fried foods, fast food, processed food, excessive dairy, eggs and coconut.  3)Get at least 150 minutes of sweaty aerobic exercise  per week.  4)Reduce stress - consider counseling, meditation and relaxation to balance other aspects of your life.

## 2017-05-08 ENCOUNTER — Other Ambulatory Visit: Payer: Self-pay | Admitting: *Deleted

## 2017-05-08 ENCOUNTER — Telehealth: Payer: Self-pay | Admitting: Endocrinology

## 2017-05-08 MED ORDER — GLUCOSE BLOOD VI STRP: ORAL_STRIP | 3 refills | Status: DC

## 2017-05-08 NOTE — Telephone Encounter (Signed)
Needs f/u appt next available.   Please refill x 2 mos.

## 2017-05-08 NOTE — Telephone Encounter (Signed)
Patient needs a refill on repaglinide (PRANDIN) 2 MG tablet however, the pharmacy will not refill until the patient has another appointment. The patient is completely out and no appointments are available for 3 weeks or more. Call patient to advise if there is any supply that can be filled and to try to fit patient into the schedule this week.

## 2017-05-08 NOTE — Telephone Encounter (Signed)
Rx done. 

## 2017-05-09 MED ORDER — REPAGLINIDE 2 MG PO TABS: ORAL_TABLET | ORAL | 0 refills | Status: DC

## 2017-05-09 NOTE — Telephone Encounter (Signed)
I contacted the patient and advised of MD's note. Refill submitted and patient scheduled for follow up 06/09/2017.

## 2017-05-11 NOTE — Telephone Encounter (Signed)
Patient was notified by the pharmacy that the medication refill was not sent in. I advised the patient that the pharmacy sent [place] a receipt of receiving the medication on the 24th.   Please resend the medication to the pharmacy and call patient when this is done.

## 2017-05-12 ENCOUNTER — Other Ambulatory Visit: Payer: Self-pay

## 2017-05-12 MED ORDER — REPAGLINIDE 2 MG PO TABS: ORAL_TABLET | ORAL | 0 refills | Status: DC

## 2017-05-12 NOTE — Telephone Encounter (Signed)
Submitted again,.

## 2017-05-15 ENCOUNTER — Other Ambulatory Visit: Payer: Self-pay

## 2017-05-15 MED ORDER — REPAGLINIDE 2 MG PO TABS: ORAL_TABLET | ORAL | 2 refills | Status: DC

## 2017-05-24 ENCOUNTER — Other Ambulatory Visit: Payer: Self-pay | Admitting: Family Medicine

## 2017-05-25 ENCOUNTER — Other Ambulatory Visit: Payer: Self-pay | Admitting: Family Medicine

## 2017-06-09 ENCOUNTER — Ambulatory Visit (INDEPENDENT_AMBULATORY_CARE_PROVIDER_SITE_OTHER): Payer: PPO | Admitting: Endocrinology

## 2017-06-09 ENCOUNTER — Encounter: Payer: Self-pay | Admitting: Endocrinology

## 2017-06-09 VITALS — BP 134/82 | HR 73 | Wt 273.6 lb

## 2017-06-09 DIAGNOSIS — E1165 Type 2 diabetes mellitus with hyperglycemia: Secondary | ICD-10-CM | POA: Diagnosis not present

## 2017-06-09 DIAGNOSIS — IMO0002 Reserved for concepts with insufficient information to code with codable children: Secondary | ICD-10-CM

## 2017-06-09 DIAGNOSIS — E1140 Type 2 diabetes mellitus with diabetic neuropathy, unspecified: Secondary | ICD-10-CM

## 2017-06-09 LAB — POCT GLYCOSYLATED HEMOGLOBIN (HGB A1C): HEMOGLOBIN A1C: 6.8
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
## 2017-06-09 NOTE — Patient Instructions (Signed)
check your blood sugar once a day.  vary the time of day when you check, between before the 3 meals, and at bedtime.  also check if you have symptoms of your blood sugar being too high or too low.  please keep a record of the readings and bring it to your next appointment here (or you can bring the meter itself).  You can write it on any piece of paper.  please call us sooner if your blood sugar goes below 70, or if you have a lot of readings over 200. Please continue the same medications Our goals are: 1.  keep your a1c below 7.  2.  Avoid low blood sugar.  3.  Stay with generic meds if we can. Please come back for a follow-up appointment in 4-5 months.

## 2017-06-29 ENCOUNTER — Other Ambulatory Visit: Payer: Self-pay | Admitting: Endocrinology

## 2017-07-03 ENCOUNTER — Other Ambulatory Visit: Payer: Self-pay | Admitting: Family Medicine

## 2017-07-03 DIAGNOSIS — I10 Essential (primary) hypertension: Secondary | ICD-10-CM

## 2017-07-08 ENCOUNTER — Other Ambulatory Visit: Payer: Self-pay | Admitting: Family Medicine

## 2017-07-19 DIAGNOSIS — E119 Type 2 diabetes mellitus without complications: Secondary | ICD-10-CM | POA: Diagnosis not present

## 2017-07-19 LAB — HM DIABETES EYE EXAM

## 2017-07-30 ENCOUNTER — Other Ambulatory Visit: Payer: Self-pay | Admitting: Endocrinology

## 2017-07-31 ENCOUNTER — Encounter: Payer: Self-pay | Admitting: Family Medicine

## 2017-08-02 ENCOUNTER — Other Ambulatory Visit: Payer: Self-pay | Admitting: Family Medicine

## 2017-08-28 ENCOUNTER — Other Ambulatory Visit: Payer: Self-pay | Admitting: Endocrinology

## 2017-09-13 ENCOUNTER — Telehealth: Payer: Self-pay

## 2017-09-13 NOTE — Telephone Encounter (Signed)
Call to Mr. Zick, spoke with his wife. Requested Mr. Obara schedule his AWV after 12/8 this year on a Tuesday or Friday.  Left my direct line to call back   [facility] RN
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
## 2017-09-21 ENCOUNTER — Ambulatory Visit: Payer: PPO | Admitting: Family Medicine

## 2017-09-21 ENCOUNTER — Encounter: Payer: Self-pay | Admitting: Family Medicine

## 2017-09-21 VITALS — BP 104/80 | HR 72 | Temp 97.5°F | Ht 70.0 in | Wt 280.3 lb

## 2017-09-21 DIAGNOSIS — E1169 Type 2 diabetes mellitus with other specified complication: Secondary | ICD-10-CM | POA: Diagnosis not present

## 2017-09-21 DIAGNOSIS — I10 Essential (primary) hypertension: Secondary | ICD-10-CM

## 2017-09-21 DIAGNOSIS — E1165 Type 2 diabetes mellitus with hyperglycemia: Secondary | ICD-10-CM | POA: Diagnosis not present

## 2017-09-21 DIAGNOSIS — E785 Hyperlipidemia, unspecified: Secondary | ICD-10-CM | POA: Diagnosis not present

## 2017-09-21 DIAGNOSIS — IMO0002 Reserved for concepts with insufficient information to code with codable children: Secondary | ICD-10-CM

## 2017-09-21 DIAGNOSIS — E1149 Type 2 diabetes mellitus with other diabetic neurological complication: Secondary | ICD-10-CM | POA: Diagnosis not present

## 2017-09-21 DIAGNOSIS — E1159 Type 2 diabetes mellitus with other circulatory complications: Secondary | ICD-10-CM

## 2017-09-21 DIAGNOSIS — K7581 Nonalcoholic steatohepatitis (NASH): Secondary | ICD-10-CM

## 2017-09-21 DIAGNOSIS — Z23 Encounter for immunization: Secondary | ICD-10-CM | POA: Diagnosis not present

## 2017-09-21 DIAGNOSIS — I152 Hypertension secondary to endocrine disorders: Secondary | ICD-10-CM

## 2017-09-21 LAB — BASIC METABOLIC PANEL
BUN: 14 mg/dL (ref 6–23)
CHLORIDE: 101 meq/L (ref 96–112)
CO2: 30 meq/L (ref 19–32)
CREATININE: 1.05 mg/dL (ref 0.40–1.50)
Calcium: 9.3 mg/dL (ref 8.4–10.5)
GFR: 90.07 mL/min (ref >60.00)
Glucose, Bld: 167 mg/dL — ABNORMAL HIGH (ref 70–99)
POTASSIUM: 3.7 meq/L (ref 3.5–5.1)
Sodium: 140 mEq/L (ref 135–145)

## 2017-09-21 LAB — LIPID PANEL
CHOL/HDL RATIO: 2
CHOLESTEROL: 103 mg/dL (ref 0–200)
HDL: 44.7 mg/dL (ref >39.00)
LDL CALC: 44 mg/dL (ref 0–99)
NONHDL: 58.66
Triglycerides: 72 mg/dL (ref 0.0–149.0)
VLDL: 14.4 mg/dL (ref 0.0–40.0)

## 2017-09-21 LAB — CBC
HEMATOCRIT: 41.4 % (ref 39.0–52.0)
HEMOGLOBIN: 13.4 g/dL (ref 13.0–17.0)
MCHC: 32.5 g/dL (ref 30.0–36.0)
MCV: 89.9 fl (ref 78.0–100.0)
Platelets: 237 10*3/uL (ref 150.0–400.0)
RBC: 4.6 Mil/uL (ref 4.22–5.81)
RDW: 15 % (ref 11.5–15.5)
WBC: 5.3 10*3/uL (ref 4.0–10.5)

## 2017-09-21 MED ORDER — GLUCOSE BLOOD VI STRP: ORAL_STRIP | 3 refills | Status: DC

## 2017-09-21 NOTE — Patient Instructions (Signed)
BEFORE YOU LEAVE: -Flu shot -Labs -follow up: 1.  Annual wellness visit with [name] sometime in the next 2-3 weeks 2.  Follow-up with Dr. Maudie Mercury in 3-4 months  We have ordered labs or studies at this visit. It can take up to 1-2 weeks for results and processing. IF results require follow up or explanation, we will call you with instructions. Clinically stable results will be released to your [facility]. If you have not heard from [place] or cannot find your results in [facility] in 2 weeks please contact our office at [phone].  If you are not yet signed up for [facility], please consider signing up.   We recommend the following healthy lifestyle for LIFE: 1) Small portions. But, make sure to get regular (at least 3 per day), healthy meals and small healthy snacks if needed.  2) Eat a healthy clean diet.   TRY TO EAT: -at least 5-7 servings of low sugar, colorful, and nutrient rich vegetables per day (not corn, potatoes or bananas.) -berries are the best choice if you wish to eat fruit (only eat small amounts if trying to reduce weight)  -lean meets (fish, white meat of chicken or [place]) -vegan proteins for some meals - beans or tofu, whole grains, nuts and seeds -Replace bad fats with good fats - good fats include: fish, nuts and seeds, canola oil, olive oil -small amounts of low fat or non fat dairy -small amounts of100 % whole grains - check the lables -drink plenty of water  AVOID: -SUGAR, sweets, anything with added sugar, corn syrup or sweeteners - must read labels as even foods advertised as "healthy" often are loaded with sugar -if you must have a sweetener, small amounts of stevia may be best -sweetened beverages and artificially sweetened beverages -simple starches (rice, bread, potatoes, pasta, chips, etc - small amounts of 100% whole grains are ok) -red meat, pork, butter -fried foods, fast food, processed food, excessive dairy, eggs and coconut.  3)Get at least 150 minutes of sweaty  aerobic exercise per week.  4)Reduce stress - consider counseling, meditation and relaxation to balance other aspects of your life. WE NOW OFFER   [facility] FAST TRACK!!!  SAME DAY Appointments for ACUTE CARE  Such as: Sprains, Injuries, cuts, abrasions, rashes, muscle pain, joint pain, back pain Colds, flu, sore throats, headache, allergies, cough, fever  Ear pain, sinus and eye infections Abdominal pain, nausea, vomiting, diarrhea, upset stomach Animal/insect bites  3 Easy Ways to Schedule: Walk-In Scheduling Call in scheduling Mychart Sign-up: [email or web address]

## 2017-09-21 NOTE — Addendum Note (Signed)
Addended by: Agnes Lawrence on: 09/21/2017 12:05 PM   Modules accepted: Orders

## 2017-09-21 NOTE — Addendum Note (Signed)
Addended by: Agnes Lawrence on: 09/21/2017 08:59 AM   Modules accepted: Orders

## 2017-09-27 ENCOUNTER — Telehealth: Payer: Self-pay | Admitting: Family Medicine

## 2017-09-27 NOTE — Telephone Encounter (Signed)
I spoke with pt and gave the glucose results.

## 2017-09-27 NOTE — Telephone Encounter (Signed)
Copied from [place] [id number]. Topic: Quick Communication - See Telephone Encounter >> Sep 27, 2017  8:33 AM Cleaster Corin, NT wrote: CRM for notification. See Telephone encounter for:   09/27/17. Pt. Calling to receive lab results. Pt. Can be reached at [phone]

## 2017-09-29 ENCOUNTER — Other Ambulatory Visit: Payer: Self-pay | Admitting: Family Medicine

## 2017-10-06 ENCOUNTER — Other Ambulatory Visit: Payer: Self-pay | Admitting: Endocrinology

## 2017-10-06 ENCOUNTER — Other Ambulatory Visit: Payer: Self-pay | Admitting: Family Medicine

## 2017-10-12 ENCOUNTER — Ambulatory Visit: Payer: PPO | Admitting: Family Medicine

## 2017-10-12 ENCOUNTER — Encounter: Payer: Self-pay | Admitting: Family Medicine

## 2017-10-12 VITALS — BP 100/60 | HR 86 | Temp 98.3°F | Ht 70.0 in | Wt 280.7 lb

## 2017-10-12 DIAGNOSIS — J101 Influenza due to other identified influenza virus with other respiratory manifestations: Secondary | ICD-10-CM | POA: Diagnosis not present

## 2017-10-12 DIAGNOSIS — R6889 Other general symptoms and signs: Secondary | ICD-10-CM | POA: Diagnosis not present

## 2017-10-12 LAB — POC INFLUENZA A&B (BINAX/QUICKVUE)
Influenza A, POC: POSITIVE — AB
Influenza B, POC: NEGATIVE

## 2017-10-12 MED ORDER — OSELTAMIVIR PHOSPHATE 75 MG PO CAPS: 75.0000 mg | ORAL_CAPSULE | Freq: Two times a day (BID) | ORAL | 0 refills | Status: DC

## 2017-10-12 NOTE — Patient Instructions (Signed)
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
## 2017-10-13 ENCOUNTER — Ambulatory Visit: Payer: PPO

## 2017-11-02 ENCOUNTER — Other Ambulatory Visit: Payer: Self-pay | Admitting: Endocrinology

## 2017-11-09 ENCOUNTER — Ambulatory Visit: Payer: PPO | Admitting: Endocrinology

## 2017-11-09 ENCOUNTER — Encounter: Payer: Self-pay | Admitting: Endocrinology

## 2017-11-09 VITALS — BP 130/82 | HR 72 | Ht 70.0 in | Wt 282.0 lb

## 2017-11-09 DIAGNOSIS — E1149 Type 2 diabetes mellitus with other diabetic neurological complication: Secondary | ICD-10-CM | POA: Diagnosis not present

## 2017-11-09 DIAGNOSIS — IMO0002 Reserved for concepts with insufficient information to code with codable children: Secondary | ICD-10-CM

## 2017-11-09 DIAGNOSIS — E1165 Type 2 diabetes mellitus with hyperglycemia: Secondary | ICD-10-CM

## 2017-11-09 LAB — POCT GLYCOSYLATED HEMOGLOBIN (HGB A1C): Hemoglobin A1C: 7.1

## 2017-11-09 NOTE — Patient Instructions (Signed)
check your blood sugar once a day.  vary the time of day when you check, between before the 3 meals, and at bedtime.  also check if you have symptoms of your blood sugar being too high or too low.  please keep a record of the readings and bring it to your next appointment here (or you can bring the meter itself).  You can write it on any piece of paper.  please call us sooner if your blood sugar goes below 70, or if you have a lot of readings over 200. Please continue the same medications Our goals are: 1.  keep your a1c below 7.  2.  Avoid low blood sugar.  3.  Stay with generic meds if we can. Please come back for a follow-up appointment in 4 months.
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
## 2017-11-19 ENCOUNTER — Other Ambulatory Visit: Payer: Self-pay | Admitting: Endocrinology

## 2017-12-05 ENCOUNTER — Telehealth: Payer: Self-pay | Admitting: Endocrinology

## 2017-12-05 MED ORDER — REPAGLINIDE 2 MG PO TABS: 4.0000 mg | ORAL_TABLET | Freq: Three times a day (TID) | ORAL | 2 refills | Status: DC

## 2017-12-05 NOTE — Telephone Encounter (Signed)
Wife John Hobbs called for husband. Patient's blood sugar over 200 for a couple of weeks Please call to advise ph# [phone]

## 2017-12-05 NOTE — Telephone Encounter (Signed)
Please increase repaglinide to 2x2 mg, 3 times a day (just before each meal) Any reason you can think of why it is high, such as steroids?

## 2017-12-06 ENCOUNTER — Other Ambulatory Visit: Payer: Self-pay

## 2017-12-06 NOTE — Telephone Encounter (Signed)
I called patient & informed him that repaglinde had been increased. I also stated that Dr. Loanne Drilling sent that prescription to pharmacy.

## 2017-12-12 ENCOUNTER — Other Ambulatory Visit: Payer: Self-pay | Admitting: Endocrinology

## 2017-12-16 ENCOUNTER — Other Ambulatory Visit: Payer: Self-pay | Admitting: Family Medicine

## 2017-12-28 ENCOUNTER — Ambulatory Visit: Payer: Self-pay | Admitting: *Deleted

## 2017-12-28 NOTE — Telephone Encounter (Signed)
Noted  

## 2017-12-28 NOTE — Telephone Encounter (Signed)
Patient is calling with dizziness that he has developed within the last 1-2 weeks. It is not getting better. Patient reports his BP and glucose levels have not been high. He states he did increase a glucose medication recently. Patient states he mostly gets dizzy with activity.  Reason for Disposition . [1] MODERATE dizziness (e.g., vertigo; feels very unsteady, interferes with normal activities) AND [2] has NOT been evaluated by physician for this  Answer Assessment - Initial Assessment Questions 1. DESCRIPTION: "Describe your dizziness."     Spinning and motion related 2. VERTIGO: "Do you feel like either you or the room is spinning or tilting?"      no 3. LIGHTHEADED: "Do you feel lightheaded?" (e.g., somewhat faint, woozy, weak upon standing)     Patient feels weak when standing 4. SEVERITY: "How bad is it?"  "Can you walk?"   - MILD - Feels unsteady but walking normally.   - MODERATE - Feels very unsteady when walking, but not falling; interferes with normal activities (e.g., school, work) .   - SEVERE - Unable to walk without falling (requires assistance).     Mostly in the morning and sometimes in the day-moderate 5. ONSET:  "When did the dizziness begin?"     1 1/2 weeks- 2 weeks 6. AGGRAVATING FACTORS: "Does anything make it worse?" (e.g., standing, change in head position)     Bending, standing- moving 7. CAUSE: "What do you think is causing the dizziness?"     BP normal glucose 8. RECURRENT SYMPTOM: "Have you had dizziness before?" If so, ask: "When was the last time?" "What happened that time?"     no 9. OTHER SYMPTOMS: "Do you have any other symptoms?" (e.g., headache, weakness, numbness, vomiting, earache)     no 10. PREGNANCY: "Is there any chance you are pregnant?" "When was your last menstrual period?"       n/a  Protocols used: DIZZINESS - VERTIGO-A-AH

## 2017-12-29 ENCOUNTER — Ambulatory Visit (INDEPENDENT_AMBULATORY_CARE_PROVIDER_SITE_OTHER): Payer: PPO | Admitting: Family Medicine

## 2017-12-29 ENCOUNTER — Encounter: Payer: Self-pay | Admitting: Family Medicine

## 2017-12-29 VITALS — BP 106/80 | HR 78 | Temp 97.8°F | Wt 276.0 lb

## 2017-12-29 DIAGNOSIS — R42 Dizziness and giddiness: Secondary | ICD-10-CM

## 2017-12-29 MED ORDER — MECLIZINE HCL 12.5 MG PO TABS
12.5000 mg | ORAL_TABLET | Freq: Three times a day (TID) | ORAL | 0 refills | Status: DC | PRN
Start: 2017-12-29 — End: 2018-05-13

## 2017-12-29 NOTE — Patient Instructions (Addendum)
Dizziness Dizziness is a common problem. It is a feeling of unsteadiness or light-headedness. You may feel like you are about to faint. Dizziness can lead to injury if you stumble or fall. Anyone can become dizzy, but dizziness is more common in older adults. This condition can be caused by a number of things, including medicines, dehydration, or illness. Follow these instructions at home: Eating and drinking  Drink enough fluid to keep your urine clear or pale yellow. This helps to keep you from becoming dehydrated. Try to drink more clear fluids, such as water.  Do not drink alcohol.  Limit your caffeine intake if told to do so by your health care provider. Check ingredients and nutrition facts to see if a food or beverage contains caffeine.  Limit your salt (sodium) intake if told to do so by your health care provider. Check ingredients and nutrition facts to see if a food or beverage contains sodium. Activity  Avoid making quick movements. ? Rise slowly from chairs and steady yourself until you feel okay. ? In the morning, first sit up on the side of the bed. When you feel okay, stand slowly while you hold onto something until you know that your balance is fine.  If you need to stand in one place for a long time, move your legs often. Tighten and relax the muscles in your legs while you are standing.  Do not drive or use heavy machinery if you feel dizzy.  Avoid bending down if you feel dizzy. Place items in your home so that they are easy for you to reach without leaning over. Lifestyle  Do not use any products that contain nicotine or tobacco, such as cigarettes and e-cigarettes. If you need help quitting, ask your health care provider.  Try to reduce your stress level by using methods such as yoga or meditation. Talk with your health care provider if you need help to manage your stress. General instructions  Watch your dizziness for any changes.  Take over-the-counter and  prescription medicines only as told by your health care provider. Talk with your health care provider if you think that your dizziness is caused by a medicine that you are taking.  Tell a friend or a family member that you are feeling dizzy. If he or she notices any changes in your behavior, have this person call your health care provider.  Keep all follow-up visits as told by your health care provider. This is important. Contact a health care provider if:  Your dizziness does not go away.  Your dizziness or light-headedness gets worse.  You feel nauseous.  You have reduced hearing.  You have new symptoms.  You are unsteady on your feet or you feel like the room is spinning. Get help right away if:  You vomit or have diarrhea and are unable to eat or drink anything.  You have problems talking, walking, swallowing, or using your arms, hands, or legs.  You feel generally weak.  You are not thinking clearly or you have trouble forming sentences. It may take a friend or family member to notice this.  You have chest pain, abdominal pain, shortness of breath, or sweating.  Your vision changes.  You have any bleeding.  You have a severe headache.  You have neck pain or a stiff neck.  You have a fever. These symptoms may represent a serious problem that is an emergency. Do not wait to see if the symptoms will go away. Get medical help  right away. Call your local emergency services (911 in the [place] Do not drive yourself to the hospital. Summary  Dizziness is a feeling of unsteadiness or light-headedness. This condition can be caused by a number of things, including medicines, dehydration, or illness.  Anyone can become dizzy, but dizziness is more common in older adults.  Drink enough fluid to keep your urine clear or pale yellow. Do not drink alcohol.  Avoid making quick movements if you feel dizzy. Monitor your dizziness for any changes. This information is not intended to  replace advice given to you by your health care provider. Make sure you discuss any questions you have with your health care provider. Document Released: 03/29/2001 Document Revised: 11/05/2016 Document Reviewed: 11/05/2016 Elsevier Interactive Patient Education  [employer].
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
## 2018-01-14 ENCOUNTER — Other Ambulatory Visit: Payer: Self-pay | Admitting: Family Medicine

## 2018-01-14 DIAGNOSIS — I10 Essential (primary) hypertension: Secondary | ICD-10-CM

## 2018-01-19 ENCOUNTER — Encounter: Payer: Self-pay | Admitting: Family Medicine

## 2018-01-19 ENCOUNTER — Ambulatory Visit (INDEPENDENT_AMBULATORY_CARE_PROVIDER_SITE_OTHER): Payer: PPO | Admitting: Family Medicine

## 2018-01-19 VITALS — BP 110/64 | HR 110 | Temp 98.1°F | Wt 276.0 lb

## 2018-01-19 DIAGNOSIS — N3001 Acute cystitis with hematuria: Secondary | ICD-10-CM

## 2018-01-19 DIAGNOSIS — R300 Dysuria: Secondary | ICD-10-CM | POA: Diagnosis not present

## 2018-01-19 DIAGNOSIS — J302 Other seasonal allergic rhinitis: Secondary | ICD-10-CM

## 2018-01-19 LAB — POC URINALSYSI DIPSTICK (AUTOMATED)
BILIRUBIN UA: NEGATIVE
Glucose, UA: NEGATIVE
Ketones, UA: NEGATIVE
Nitrite, UA: NEGATIVE
PH UA: 6 (ref 5.0–8.0)
RBC UA: 2
Spec Grav, UA: >=1.03 — AB (ref 1.010–1.025)
Urobilinogen, UA: 1 E.U./dL

## 2018-01-19 MED ORDER — SULFAMETHOXAZOLE-TRIMETHOPRIM 800-160 MG PO TABS: 1.0000 | ORAL_TABLET | Freq: Two times a day (BID) | ORAL | 0 refills | Status: AC

## 2018-01-19 MED ORDER — LORATADINE 10 MG PO TABS: 10.0000 mg | ORAL_TABLET | Freq: Every day | ORAL | 1 refills | Status: DC

## 2018-01-19 NOTE — Patient Instructions (Signed)
Urinary Tract Infection, Adult A urinary tract infection (UTI) is an infection of any part of the urinary tract. The urinary tract includes the:  Kidneys.  Ureters.  Bladder.  Urethra.  These organs make, store, and get rid of pee (urine) in the body. Follow these instructions at home:  Take over-the-counter and prescription medicines only as told by your doctor.  If you were prescribed an antibiotic medicine, take it as told by your doctor. Do not stop taking the antibiotic even if you start to feel better.  Avoid the following drinks: ? Alcohol. ? Caffeine. ? Tea. ? Carbonated drinks.  Drink enough fluid to keep your pee clear or pale yellow.  Keep all follow-up visits as told by your doctor. This is important.  Make sure to: ? Empty your bladder often and completely. Do not to hold pee for long periods of time. ? Empty your bladder before and after sex. ? Wipe from front to back after a bowel movement if you are male. Use each tissue one time when you wipe. Contact a doctor if:  You have back pain.  You have a fever.  You feel sick to your stomach (nauseous).  You throw up (vomit).  Your symptoms do not get better after 3 days.  Your symptoms go away and then come back. Get help right away if:  You have very bad back pain.  You have very bad lower belly (abdominal) pain.  You are throwing up and cannot keep down any medicines or water. This information is not intended to replace advice given to you by your health care provider. Make sure you discuss any questions you have with your health care provider. Document Released: 03/21/2008 Document Revised: 03/10/2016 Document Reviewed: 08/24/2015 Elsevier Interactive Patient Education  [employer].  Allergies An allergy is when your body reacts to a substance in a way that is not normal. An allergic reaction can happen after you:  Eat something.  Breathe in something.  Touch something.  You can  be allergic to:  Things that are only around during certain seasons, like molds and pollens.  Foods.  Drugs.  Insects.  Animal dander.  What are the signs or symptoms?  Puffiness (swelling). This may happen on the lips, face, tongue, mouth, or throat.  Sneezing.  Coughing.  Breathing loudly (wheezing).  Stuffy nose.  Tingling in the mouth.  A rash.  Itching.  Itchy, red, puffy areas of skin (hives).  Watery eyes.  Throwing up (vomiting).  Watery poop (diarrhea).  Dizziness.  Feeling faint or fainting.  Trouble breathing or swallowing.  A tight feeling in the chest.  A fast heartbeat. How is this diagnosed? Allergies can be diagnosed with:  A medical and family history.  Skin tests.  Blood tests.  A food diary. A food diary is a record of all the foods, drinks, and symptoms you have each day.  The results of an elimination diet. This diet involves making sure not to eat certain foods and then seeing what happens when you start eating them again.  How is this treated? There is no cure for allergies, but allergic reactions can be treated with medicine. Severe reactions usually need to be treated at a hospital. How is this prevented? The best way to prevent an allergic reaction is to avoid the thing you are allergic to. Allergy shots and medicines can also help prevent reactions in some cases. This information is not intended to replace advice given to you by  your health care provider. Make sure you discuss any questions you have with your health care provider. Document Released: 01/28/2013 Document Revised: 05/30/2016 Document Reviewed: 07/15/2014 Elsevier Interactive Patient Education  [employer].
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
## 2018-01-21 LAB — URINE CULTURE
MICRO NUMBER:: 90423161
SPECIMEN QUALITY:: ADEQUATE

## 2018-01-22 ENCOUNTER — Ambulatory Visit: Payer: PPO | Admitting: Family Medicine
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
## 2018-01-30 ENCOUNTER — Encounter: Payer: Self-pay | Admitting: Family Medicine

## 2018-01-30 ENCOUNTER — Ambulatory Visit (INDEPENDENT_AMBULATORY_CARE_PROVIDER_SITE_OTHER): Payer: PPO | Admitting: Family Medicine

## 2018-01-30 VITALS — BP 100/68 | HR 74 | Temp 98.1°F | Ht 70.0 in | Wt 274.3 lb

## 2018-01-30 DIAGNOSIS — E1169 Type 2 diabetes mellitus with other specified complication: Secondary | ICD-10-CM

## 2018-01-30 DIAGNOSIS — N3001 Acute cystitis with hematuria: Secondary | ICD-10-CM

## 2018-01-30 DIAGNOSIS — E1149 Type 2 diabetes mellitus with other diabetic neurological complication: Secondary | ICD-10-CM | POA: Diagnosis not present

## 2018-01-30 DIAGNOSIS — IMO0002 Reserved for concepts with insufficient information to code with codable children: Secondary | ICD-10-CM

## 2018-01-30 DIAGNOSIS — E1159 Type 2 diabetes mellitus with other circulatory complications: Secondary | ICD-10-CM | POA: Diagnosis not present

## 2018-01-30 DIAGNOSIS — E1165 Type 2 diabetes mellitus with hyperglycemia: Secondary | ICD-10-CM | POA: Diagnosis not present

## 2018-01-30 DIAGNOSIS — I10 Essential (primary) hypertension: Secondary | ICD-10-CM | POA: Diagnosis not present

## 2018-01-30 DIAGNOSIS — E785 Hyperlipidemia, unspecified: Secondary | ICD-10-CM

## 2018-01-30 LAB — POCT URINALYSIS DIPSTICK
Bilirubin, UA: NEGATIVE
GLUCOSE UA: NEGATIVE
Ketones, UA: NEGATIVE
LEUKOCYTES UA: NEGATIVE
Nitrite, UA: NEGATIVE
PROTEIN UA: NEGATIVE
RBC UA: NEGATIVE
Spec Grav, UA: 1.025 (ref 1.010–1.025)
Urobilinogen, UA: 0.2 E.U./dL
pH, UA: 5 (ref 5.0–8.0)

## 2018-01-30 LAB — CBC
HCT: 40.1 % (ref 39.0–52.0)
Hemoglobin: 13.3 g/dL (ref 13.0–17.0)
MCHC: 33.2 g/dL (ref 30.0–36.0)
MCV: 87.4 fl (ref 78.0–100.0)
PLATELETS: 325 10*3/uL (ref 150.0–400.0)
RBC: 4.59 Mil/uL (ref 4.22–5.81)
RDW: 14.6 % (ref 11.5–15.5)
WBC: 6.8 10*3/uL (ref 4.0–10.5)

## 2018-01-30 LAB — LIPID PANEL
CHOLESTEROL: 106 mg/dL (ref 0–200)
HDL: 37.8 mg/dL — ABNORMAL LOW (ref >39.00)
LDL CALC: 52 mg/dL (ref 0–99)
NonHDL: 67.83
TRIGLYCERIDES: 80 mg/dL (ref 0.0–149.0)
Total CHOL/HDL Ratio: 3
VLDL: 16 mg/dL (ref 0.0–40.0)

## 2018-01-30 LAB — BASIC METABOLIC PANEL
BUN: 14 mg/dL (ref 6–23)
CALCIUM: 10 mg/dL (ref 8.4–10.5)
CO2: 28 mEq/L (ref 19–32)
Chloride: 101 mEq/L (ref 96–112)
Creatinine, Ser: 1.18 mg/dL (ref 0.40–1.50)
GFR: 78.64 mL/min (ref >60.00)
Glucose, Bld: 120 mg/dL — ABNORMAL HIGH (ref 70–99)
Potassium: 4.1 mEq/L (ref 3.5–5.1)
SODIUM: 139 meq/L (ref 135–145)

## 2018-01-30 LAB — HEMOGLOBIN A1C: Hgb A1c MFr Bld: 8.3 % — ABNORMAL HIGH (ref 4.6–6.5)

## 2018-01-30 NOTE — Addendum Note (Signed)
Addended by: Agnes Lawrence on: 01/30/2018 08:04 AM   Modules accepted: Orders

## 2018-01-30 NOTE — Patient Instructions (Addendum)
BEFORE YOU LEAVE: -urine dip --> reflex micro/culture if abnormal -labs -follow up:  -1) AWV with [name] on a Tuesday in next several months -2) follow up with Dr. Maudie Mercury in 3-4 months   Follow up promptly if any further urinary symptoms.  Please see [name] at your Annual Wellness Visit and/or a lawyer and/or go to this website to help you with advanced directives and designating a health care power of attorney so that your wishes will be followed should you become too ill to make your own medical decisions. Proor.no   We have ordered labs or studies at this visit. It can take up to 1-2 weeks for results and processing. IF results require follow up or explanation, we will call you with instructions. Clinically stable results will be released to your [facility]. If you have not heard from [place] or cannot find your results in [facility] in 2 weeks please contact our office at [phone].  If you are not yet signed up for [facility], please consider signing up.   We recommend the following healthy lifestyle for LIFE: 1) Small portions. But, make sure to get regular (at least 3 per day), healthy meals and small healthy snacks if needed.  2) Eat a healthy clean diet.   TRY TO EAT: -at least 5-7 servings of low sugar, colorful, and nutrient rich vegetables per day (not corn, potatoes or bananas.) -berries are the best choice if you wish to eat fruit (only eat small amounts if trying to reduce weight)  -lean meets (fish, white meat of chicken or [place]) -vegan proteins for some meals - beans or tofu, whole grains, nuts and seeds -Replace bad fats with good fats - good fats include: fish, nuts and seeds, canola oil, olive oil -small amounts of low fat or non fat dairy -small amounts of100 % whole grains - check the lables -drink plenty of water  AVOID: -SUGAR, sweets, anything with added sugar, corn syrup or sweeteners - must read labels as even foods advertised as  "healthy" often are loaded with sugar -if you must have a sweetener, small amounts of stevia may be best -sweetened beverages and artificially sweetened beverages -simple starches (rice, bread, potatoes, pasta, chips, etc - small amounts of 100% whole grains are ok) -red meat, pork, butter -fried foods, fast food, processed food, excessive dairy, eggs and coconut.  3)Get at least 150 minutes of sweaty aerobic exercise per week.  4)Reduce stress - consider counseling, meditation and relaxation to balance other aspects of your life.

## 2018-02-12 ENCOUNTER — Encounter (INDEPENDENT_AMBULATORY_CARE_PROVIDER_SITE_OTHER): Payer: Self-pay | Admitting: Orthopaedic Surgery

## 2018-02-12 ENCOUNTER — Ambulatory Visit (INDEPENDENT_AMBULATORY_CARE_PROVIDER_SITE_OTHER): Payer: PPO | Admitting: Orthopaedic Surgery

## 2018-02-12 ENCOUNTER — Ambulatory Visit (INDEPENDENT_AMBULATORY_CARE_PROVIDER_SITE_OTHER): Payer: PPO

## 2018-02-12 ENCOUNTER — Other Ambulatory Visit (INDEPENDENT_AMBULATORY_CARE_PROVIDER_SITE_OTHER): Payer: Self-pay

## 2018-02-12 DIAGNOSIS — M25512 Pain in left shoulder: Secondary | ICD-10-CM

## 2018-02-12 DIAGNOSIS — G8929 Other chronic pain: Secondary | ICD-10-CM

## 2018-02-12 MED ORDER — METHYLPREDNISOLONE ACETATE 40 MG/ML IJ SUSP
40.0000 mg | INTRAMUSCULAR | Status: AC | PRN
  Administered 2018-02-12: 40 mg via INTRA_ARTICULAR

## 2018-02-12 MED ORDER — TRAMADOL HCL 50 MG PO TABS: 50.0000 mg | ORAL_TABLET | Freq: Four times a day (QID) | ORAL | 0 refills | Status: DC | PRN

## 2018-02-12 MED ORDER — LIDOCAINE HCL 1 % IJ SOLN
3.0000 mL | INTRAMUSCULAR | Status: AC | PRN
  Administered 2018-02-12: 3 mL
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
## 2018-03-09 ENCOUNTER — Ambulatory Visit: Payer: PPO | Admitting: Endocrinology

## 2018-03-13 ENCOUNTER — Encounter (INDEPENDENT_AMBULATORY_CARE_PROVIDER_SITE_OTHER): Payer: Self-pay | Admitting: Orthopaedic Surgery

## 2018-03-13 ENCOUNTER — Ambulatory Visit (INDEPENDENT_AMBULATORY_CARE_PROVIDER_SITE_OTHER): Payer: PPO | Admitting: Orthopaedic Surgery

## 2018-03-13 DIAGNOSIS — M7542 Impingement syndrome of left shoulder: Secondary | ICD-10-CM | POA: Insufficient documentation

## 2018-03-13 DIAGNOSIS — G8929 Other chronic pain: Secondary | ICD-10-CM | POA: Diagnosis not present

## 2018-03-13 DIAGNOSIS — M25512 Pain in left shoulder: Secondary | ICD-10-CM | POA: Diagnosis not present

## 2018-03-13 NOTE — Progress Notes (Signed)
The patient continues to experience significant left shoulder pain.  At his last visit I provided injection in the subacromial area of the shoulder and he said this not help at all.  Really been on anti-inflammatories and tramadol and he still experiencing discomfort with his left shoulder as well as limited overhead activity and external rotation.  He said the steroid injection did not bump up his blood glucose too much.  He is a type II diabetic.  On exam he still has significant limitations with forward flexion as well as external rotation and abduction of the left shoulder with severe pain as well.  We did try home exercise program and try setting up physical therapy and this will failure as well.  At this point given the continued left shoulder pain and decreased mobility of the shoulder, we will obtain an MRI to rule out rotator cuff tear and to help guide [place] in future treatment modalities with the possibility of doing surgery as well.  He understands this fully.  All questions concerns were answered and addressed.  We will see him back once the MRI is obtained.

## 2018-03-16 ENCOUNTER — Other Ambulatory Visit (INDEPENDENT_AMBULATORY_CARE_PROVIDER_SITE_OTHER): Payer: Self-pay

## 2018-03-16 DIAGNOSIS — G8929 Other chronic pain: Secondary | ICD-10-CM

## 2018-03-16 DIAGNOSIS — M25512 Pain in left shoulder: Principal | ICD-10-CM

## 2018-03-19 ENCOUNTER — Other Ambulatory Visit: Payer: Self-pay | Admitting: Family Medicine

## 2018-03-19 DIAGNOSIS — J302 Other seasonal allergic rhinitis: Secondary | ICD-10-CM

## 2018-03-20 NOTE — Telephone Encounter (Signed)
Dr Maudie Mercury pt.

## 2018-03-23 NOTE — Telephone Encounter (Signed)
Rx done. 

## 2018-03-23 NOTE — Telephone Encounter (Signed)
This is over the counter. If needs rx for flex dollars ok to provide him rx for 30, 3 rf.

## 2018-03-25 ENCOUNTER — Other Ambulatory Visit: Payer: Self-pay | Admitting: Family Medicine

## 2018-03-25 ENCOUNTER — Ambulatory Visit
Admission: RE | Admit: 2018-03-25 | Discharge: 2018-03-25 | Disposition: A | Discharge status: Home / Self Care | Payer: PPO | Source: Ambulatory Visit | Attending: Orthopaedic Surgery | Admitting: Orthopaedic Surgery

## 2018-03-25 DIAGNOSIS — R600 Localized edema: Secondary | ICD-10-CM | POA: Diagnosis not present

## 2018-03-25 DIAGNOSIS — M25512 Pain in left shoulder: Principal | ICD-10-CM

## 2018-03-25 DIAGNOSIS — G8929 Other chronic pain: Secondary | ICD-10-CM

## 2018-03-25 DIAGNOSIS — J302 Other seasonal allergic rhinitis: Secondary | ICD-10-CM

## 2018-03-30 ENCOUNTER — Other Ambulatory Visit: Payer: Self-pay | Admitting: Family Medicine

## 2018-04-01 ENCOUNTER — Other Ambulatory Visit: Payer: Self-pay | Admitting: Family Medicine

## 2018-04-02 ENCOUNTER — Ambulatory Visit (INDEPENDENT_AMBULATORY_CARE_PROVIDER_SITE_OTHER): Payer: PPO | Admitting: Orthopaedic Surgery

## 2018-04-02 ENCOUNTER — Encounter (INDEPENDENT_AMBULATORY_CARE_PROVIDER_SITE_OTHER): Payer: Self-pay | Admitting: Orthopaedic Surgery

## 2018-04-02 DIAGNOSIS — M25512 Pain in left shoulder: Secondary | ICD-10-CM

## 2018-04-02 DIAGNOSIS — G8929 Other chronic pain: Secondary | ICD-10-CM | POA: Diagnosis not present

## 2018-04-02 NOTE — Progress Notes (Signed)
Patient comes in today for follow-up after MRI of his left shoulder.  He was having a lot of problems with abducting his shoulder and I felt like there was a possibility of rotator cuff tear.  He had a previous injury to that shoulder and was developing signs of adhesive capsulitis as well.  A subacromial steroid injection did not help at all.  He has a home exercise program that the family member gave him for his shoulder that had similar issues and is trying that.  On exam he still has limited abduction of the shoulder and it limited external rotation on the left side.  The MRI does not show any rotator cuff tear at all but he does show signs of adhesive capsulitis with thickening of the axillary pouch.  There is moderate acromioclavicular arthritic changes.  At this point I recommended at least an intra-articular steroid injection under direct fluoroscopy in the left shoulder as well as physical therapy.  He says he will think about these things and let us know.  With that being said follow-up will otherwise be as needed.

## 2018-04-08 ENCOUNTER — Other Ambulatory Visit: Payer: Self-pay | Admitting: Endocrinology

## 2018-04-09 ENCOUNTER — Telehealth (INDEPENDENT_AMBULATORY_CARE_PROVIDER_SITE_OTHER): Payer: Self-pay | Admitting: Orthopaedic Surgery

## 2018-04-09 ENCOUNTER — Other Ambulatory Visit (INDEPENDENT_AMBULATORY_CARE_PROVIDER_SITE_OTHER): Payer: Self-pay

## 2018-04-09 DIAGNOSIS — M25512 Pain in left shoulder: Secondary | ICD-10-CM

## 2018-04-09 NOTE — Telephone Encounter (Signed)
Patients wife called and said patient would like to proceed with injection but they are wanting more information about it and also if anything needs to be done in order to schedule appt. CB # [phone]

## 2018-04-09 NOTE — Telephone Encounter (Signed)
Sent order to [facility]

## 2018-04-16 ENCOUNTER — Encounter: Payer: Self-pay | Admitting: Endocrinology

## 2018-04-16 ENCOUNTER — Ambulatory Visit: Payer: PPO | Admitting: Endocrinology

## 2018-04-16 VITALS — BP 130/90 | HR 75 | Wt 276.2 lb

## 2018-04-16 DIAGNOSIS — E1165 Type 2 diabetes mellitus with hyperglycemia: Secondary | ICD-10-CM | POA: Diagnosis not present

## 2018-04-16 DIAGNOSIS — E1149 Type 2 diabetes mellitus with other diabetic neurological complication: Secondary | ICD-10-CM

## 2018-04-16 DIAGNOSIS — IMO0002 Reserved for concepts with insufficient information to code with codable children: Secondary | ICD-10-CM

## 2018-04-16 LAB — POCT GLYCOSYLATED HEMOGLOBIN (HGB A1C): HEMOGLOBIN A1C: 7.3 % — AB (ref 4.0–5.6)

## 2018-04-16 MED ORDER — BROMOCRIPTINE MESYLATE 2.5 MG PO TABS: ORAL_TABLET | ORAL | 11 refills | Status: DC

## 2018-04-16 NOTE — Patient Instructions (Addendum)
check your blood sugar once a day.  vary the time of day when you check, between before the 3 meals, and at bedtime.  also check if you have symptoms of your blood sugar being too high or too low.  please keep a record of the readings and bring it to your next appointment here (or you can bring the meter itself).  You can write it on any piece of paper.  please call us sooner if your blood sugar goes below 70, or if you have a lot of readings over 200. I have sent a prescription to your pharmacy, to add "bromocriptine."  Please continue the same other diabetes medications.  Our goals are: 1.  keep your a1c below 7.  2.  Avoid low blood sugar.  3.  Stay with generic meds if we can. Please come back for a follow-up appointment in 4 months.
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
## 2018-05-07 ENCOUNTER — Encounter (INDEPENDENT_AMBULATORY_CARE_PROVIDER_SITE_OTHER): Payer: Self-pay | Admitting: Physical Medicine and Rehabilitation

## 2018-05-07 ENCOUNTER — Ambulatory Visit (INDEPENDENT_AMBULATORY_CARE_PROVIDER_SITE_OTHER): Payer: PPO | Admitting: Physical Medicine and Rehabilitation

## 2018-05-07 ENCOUNTER — Ambulatory Visit (INDEPENDENT_AMBULATORY_CARE_PROVIDER_SITE_OTHER): Payer: Self-pay

## 2018-05-07 VITALS — BP 126/84 | HR 84 | Temp 97.8°F

## 2018-05-07 DIAGNOSIS — G8929 Other chronic pain: Secondary | ICD-10-CM | POA: Diagnosis not present

## 2018-05-07 DIAGNOSIS — M25512 Pain in left shoulder: Secondary | ICD-10-CM | POA: Diagnosis not present

## 2018-05-07 MED ORDER — BUPIVACAINE HCL 0.5 % IJ SOLN
3.0000 mL | INTRAMUSCULAR | Status: AC | PRN
Start: 2018-05-07 — End: 2018-05-07
  Administered 2018-05-07: 3 mL via INTRA_ARTICULAR

## 2018-05-07 MED ORDER — TRIAMCINOLONE ACETONIDE 40 MG/ML IJ SUSP
80.0000 mg | INTRAMUSCULAR | Status: AC | PRN
  Administered 2018-05-07: 80 mg via INTRA_ARTICULAR

## 2018-05-07 NOTE — Patient Instructions (Signed)
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
## 2018-05-07 NOTE — Progress Notes (Signed)
Pain level today 4 - did take medication for pain this morning.  Pain level is 10 without medication.  Affects daily activities about 7 - can still do activities but hurts, has to take medication for relief.  Previously took Tramadol, now takes Oxycodone.126/
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
## 2018-05-15 ENCOUNTER — Ambulatory Visit (INDEPENDENT_AMBULATORY_CARE_PROVIDER_SITE_OTHER): Payer: PPO | Admitting: Family Medicine

## 2018-05-15 ENCOUNTER — Ambulatory Visit (INDEPENDENT_AMBULATORY_CARE_PROVIDER_SITE_OTHER): Payer: PPO

## 2018-05-15 ENCOUNTER — Encounter: Payer: Self-pay | Admitting: Family Medicine

## 2018-05-15 VITALS — BP 102/70 | HR 84 | Temp 97.6°F | Ht 70.0 in | Wt 278.5 lb

## 2018-05-15 VITALS — BP 102/70 | HR 84 | Ht 70.0 in | Wt 278.0 lb

## 2018-05-15 DIAGNOSIS — I10 Essential (primary) hypertension: Secondary | ICD-10-CM | POA: Diagnosis not present

## 2018-05-15 DIAGNOSIS — E1165 Type 2 diabetes mellitus with hyperglycemia: Secondary | ICD-10-CM

## 2018-05-15 DIAGNOSIS — D649 Anemia, unspecified: Secondary | ICD-10-CM

## 2018-05-15 DIAGNOSIS — E1159 Type 2 diabetes mellitus with other circulatory complications: Secondary | ICD-10-CM | POA: Diagnosis not present

## 2018-05-15 DIAGNOSIS — E1149 Type 2 diabetes mellitus with other diabetic neurological complication: Secondary | ICD-10-CM | POA: Diagnosis not present

## 2018-05-15 DIAGNOSIS — G8929 Other chronic pain: Secondary | ICD-10-CM | POA: Diagnosis not present

## 2018-05-15 DIAGNOSIS — Z0000 Encounter for general adult medical examination without abnormal findings: Secondary | ICD-10-CM

## 2018-05-15 DIAGNOSIS — E785 Hyperlipidemia, unspecified: Secondary | ICD-10-CM | POA: Diagnosis not present

## 2018-05-15 DIAGNOSIS — M25512 Pain in left shoulder: Secondary | ICD-10-CM

## 2018-05-15 DIAGNOSIS — E1169 Type 2 diabetes mellitus with other specified complication: Secondary | ICD-10-CM

## 2018-05-15 DIAGNOSIS — M7542 Impingement syndrome of left shoulder: Secondary | ICD-10-CM

## 2018-05-15 DIAGNOSIS — IMO0002 Reserved for concepts with insufficient information to code with codable children: Secondary | ICD-10-CM

## 2018-05-15 LAB — CBC
HCT: 38.8 % — ABNORMAL LOW (ref 39.0–52.0)
Hemoglobin: 12.7 g/dL — ABNORMAL LOW (ref 13.0–17.0)
MCHC: 32.8 g/dL (ref 30.0–36.0)
MCV: 88.9 fl (ref 78.0–100.0)
Platelets: 270 10*3/uL (ref 150.0–400.0)
RBC: 4.36 Mil/uL (ref 4.22–5.81)
RDW: 15.4 % (ref 11.5–15.5)
WBC: 6.5 10*3/uL (ref 4.0–10.5)

## 2018-05-15 LAB — BASIC METABOLIC PANEL
BUN: 18 mg/dL (ref 6–23)
CO2: 29 meq/L (ref 19–32)
Calcium: 9.8 mg/dL (ref 8.4–10.5)
Chloride: 102 mEq/L (ref 96–112)
Creatinine, Ser: 1.15 mg/dL (ref 0.40–1.50)
GFR: 80.94 mL/min (ref >60.00)
Glucose, Bld: 212 mg/dL — ABNORMAL HIGH (ref 70–99)
Potassium: 4.1 mEq/L (ref 3.5–5.1)
SODIUM: 138 meq/L (ref 135–145)

## 2018-05-15 NOTE — Progress Notes (Signed)
Lucretia Kern, DO

## 2018-05-15 NOTE — Addendum Note (Signed)
Addended by: Agnes Lawrence on: 05/15/2018 05:13 PM   Modules accepted: Orders

## 2018-05-15 NOTE — Patient Instructions (Signed)
BEFORE YOU LEAVE: -AWV with [name] -labs -follow up: 3-4 months  Consider Osteopathic Musculoskeletal Medicine for the shoulder, please let us know if you want to schedule this.  We have ordered labs or studies at this visit. It can take up to 1-2 weeks for results and processing. IF results require follow up or explanation, we will call you with instructions. Clinically stable results will be released to your [facility]. If you have not heard from [place] or cannot find your results in [facility] in 2 weeks please contact our office at [phone].  If you are not yet signed up for [facility], please consider signing up.   We recommend the following healthy lifestyle for LIFE: 1) Small portions. But, make sure to get regular (at least 3 per day), healthy meals and small healthy snacks if needed.  2) Eat a healthy clean diet.   TRY TO EAT: -at least 5-7 servings of low sugar, colorful, and nutrient rich vegetables per day (not corn, potatoes or bananas.) -berries are the best choice if you wish to eat fruit (only eat small amounts if trying to reduce weight)  -lean meets (fish, white meat of chicken or [place]) -vegan proteins for some meals - beans or tofu, whole grains, nuts and seeds -Replace bad fats with good fats - good fats include: fish, nuts and seeds, canola oil, olive oil -small amounts of low fat or non fat dairy -small amounts of100 % whole grains - check the lables -drink plenty of water  AVOID: -SUGAR, sweets, anything with added sugar, corn syrup or sweeteners - must read labels as even foods advertised as "healthy" often are loaded with sugar -if you must have a sweetener, small amounts of stevia may be best -sweetened beverages and artificially sweetened beverages -simple starches (rice, bread, potatoes, pasta, chips, etc - small amounts of 100% whole grains are ok) -red meat, pork, butter -fried foods, fast food, processed food, excessive dairy, eggs and coconut.  3)Get at least  150 minutes of sweaty aerobic exercise per week.  4)Reduce stress - consider counseling, meditation and relaxation to balance other aspects of your life.

## 2018-05-15 NOTE — Patient Instructions (Addendum)
John Hobbs , Thank you for taking time to come for your Medicare Wellness Visit. I appreciate your ongoing commitment to your health goals. Please review the following plan we discussed and let me know if I can assist you in the future.   Deaf & Hard of Hearing Division Services - can assist with hearing aid x 1  No reviews  [employer] Office  [address]  [phone]  [email or web address]-[email or web address]/sites/default/files/hearingpedia/Guide_How_to_Buy_Hearing_Aids.pdf  Review information on the DASH eating plan, which is low sodium. This will help your weight loss efforts    These are the goals we discussed: Goals    . Blood Pressure < 140/90    . HEMOGLOBIN A1C < 7.0    . LDL CALC < 100    . Weight (lb) < 200 lb (90.7 kg)     Keep Eat less but well. Try to incorporate some of the information in the DASH plan given, as sodium interferes with weight loss        This is a list of the screening recommended for you and due dates:  Health Maintenance  Topic Date Due  . Flu Shot  05/17/2018  . Eye exam for diabetics  07/19/2018  . Hemoglobin A1C  10/17/2018  . Lipid (cholesterol) test  01/31/2019  . Complete foot exam   04/17/2019  . Tetanus Vaccine  08/28/2021  . Colon Cancer Screening  02/23/2026  . Pneumonia vaccines  Completed  .  Hepatitis C: One time screening is recommended by Center for Disease Control  (CDC) for  adults born from [birth year] through 1963.   Addressed    Health Maintenance, Male A healthy lifestyle and preventive care is important for your health and wellness. Ask your health care provider about what schedule of regular examinations is right for you. What should I know about weight and diet? Eat a Healthy Diet  Eat plenty of vegetables, fruits, whole grains, low-fat dairy products, and lean protein.  Do not eat a lot of foods high in solid fats, added sugars, or salt.  Maintain a Healthy Weight Regular exercise can help you  achieve or maintain a healthy weight. You should:  Do at least 150 minutes of exercise each week. The exercise should increase your heart rate and make you sweat (moderate-intensity exercise).  Do strength-training exercises at least twice a week.  Watch Your Levels of Cholesterol and Blood Lipids  Have your blood tested for lipids and cholesterol every 5 years starting at 70 years of age. If you are at high risk for heart disease, you should start having your blood tested when you are 70 years old. You may need to have your cholesterol levels checked more often if: ? Your lipid or cholesterol levels are high. ? You are older than 70 years of age. ? You are at high risk for heart disease.  What should I know about cancer screening? Many types of cancers can be detected early and may often be prevented. Lung Cancer  You should be screened every year for lung cancer if: ? You are a current smoker who has smoked for at least 30 years. ? You are a former smoker who has quit within the past 15 years.  Talk to your health care provider about your screening options, when you should start screening, and how often you should be screened.  Colorectal Cancer  Routine colorectal cancer screening usually begins at 70 years of age and should be repeated every 5-10 years until  you are 70 years old. You may need to be screened more often if early forms of precancerous polyps or small growths are found. Your health care provider may recommend screening at an earlier age if you have risk factors for colon cancer.  Your health care provider may recommend using home test kits to check for hidden blood in the stool.  A small camera at the end of a tube can be used to examine your colon (sigmoidoscopy or colonoscopy). This checks for the earliest forms of colorectal cancer.  Prostate and Testicular Cancer  Depending on your age and overall health, your health care provider may do certain tests to screen  for prostate and testicular cancer.  Talk to your health care provider about any symptoms or concerns you have about testicular or prostate cancer.  Skin Cancer  Check your skin from head to toe regularly.  Tell your health care provider about any new moles or changes in moles, especially if: ? There is a change in a mole's size, shape, or color. ? You have a mole that is larger than a pencil eraser.  Always use sunscreen. Apply sunscreen liberally and repeat throughout the day.  Protect yourself by wearing long sleeves, pants, a wide-brimmed hat, and sunglasses when outside.  What should I know about heart disease, diabetes, and high blood pressure?  If you are 68-76 years of age, have your blood pressure checked every 3-5 years. If you are 81 years of age or older, have your blood pressure checked every year. You should have your blood pressure measured twice-once when you are at a hospital or clinic, and once when you are not at a hospital or clinic. Record the average of the two measurements. To check your blood pressure when you are not at a hospital or clinic, you can use: ? An automated blood pressure machine at a pharmacy. ? A home blood pressure monitor.  Talk to your health care provider about your target blood pressure.  If you are between 34-28 years old, ask your health care provider if you should take aspirin to prevent heart disease.  Have regular diabetes screenings by checking your fasting blood sugar level. ? If you are at a normal weight and have a low risk for diabetes, have this test once every three years after the age of 66. ? If you are overweight and have a high risk for diabetes, consider being tested at a younger age or more often.  A one-time screening for abdominal aortic aneurysm (AAA) by ultrasound is recommended for men aged 71-75 years who are current or former smokers. What should I know about preventing infection? Hepatitis B If you have a higher  risk for hepatitis B, you should be screened for this virus. Talk with your health care provider to find out if you are at risk for hepatitis B infection. Hepatitis C Blood testing is recommended for:  Everyone born from [birth year] through 1965.  Anyone with known risk factors for hepatitis C.  Sexually Transmitted Diseases (STDs)  You should be screened each year for STDs including gonorrhea and chlamydia if: ? You are sexually active and are younger than 70 years of age. ? You are older than 70 years of age and your health care provider tells you that you are at risk for this type of infection. ? Your sexual activity has changed since you were last screened and you are at an increased risk for chlamydia or gonorrhea. Ask your health care provider  if you are at risk.  Talk with your health care provider about whether you are at high risk of being infected with HIV. Your health care provider may recommend a prescription medicine to help prevent HIV infection.  What else can I do?  Schedule regular health, dental, and eye exams.  Stay current with your vaccines (immunizations).  Do not use any tobacco products, such as cigarettes, chewing tobacco, and e-cigarettes. If you need help quitting, ask your health care provider.  Limit alcohol intake to no more than 2 drinks per day. One drink equals 12 ounces of beer, 5 ounces of wine, or 1 ounces of hard liquor.  Do not use street drugs.  Do not share needles.  Ask your health care provider for help if you need support or information about quitting drugs.  Tell your health care provider if you often feel depressed.  Tell your health care provider if you have ever been abused or do not feel safe at home. This information is not intended to replace advice given to you by your health care provider. Make sure you discuss any questions you have with your health care provider. Document Released: 03/31/2008 Document Revised: 06/01/2016 Document  Reviewed: 07/07/2015 Elsevier Interactive Patient Education  2018 [employer].    Prevention of falls: Remove rugs or any tripping hazards in the home Use Non slip mats in bathtubs and showers Placing grab bars next to the toilet and or shower Placing handrails on both sides of the stair way Adding extra lighting in the home.   Personal safety issues reviewed:  1. Consider starting a community watch program per [facility] 2.  Changes batteries is smoke detector and/or carbon monoxide detector  3.  If you have firearms; keep them in a safe place 4.  Wear protection when in the sun; Always wear sunscreen or a hat; It is good to have your doctor check your skin annually or review any new areas of concern 5. Driving safety; Keep in the right lane; stay 3 car lengths behind the car in front of you on the highway; look 3 times prior to pulling out; carry your cell phone everywhere you go!    Learn about the Yellow Dot program:  The program allows first responders at your emergency to have access to who your physician is, as well as your medications and medical conditions.  Citizens requesting the Yellow Dot Packages should contact Master Corporal Nunzio Cobbs at the [facility] [phone] for the first week of the program and beginning the week after Easter citizens should contact their Scientist, physiological.     DASH Eating Plan DASH stands for "Dietary Approaches to Stop Hypertension." The DASH eating plan is a healthy eating plan that has been shown to reduce high blood pressure (hypertension). It may also reduce your risk for type 2 diabetes, heart disease, and stroke. The DASH eating plan may also help with weight loss. What are tips for following this plan? General guidelines  Avoid eating more than 2,300 mg (milligrams) of salt (sodium) a day. If you have hypertension, you may need to reduce your sodium intake to 1,500 mg a day.  Limit  alcohol intake to no more than 1 drink a day for nonpregnant women and 2 drinks a day for men. One drink equals 12 oz of beer, 5 oz of wine, or 1 oz of hard liquor.  Work with your health care provider to maintain a healthy body weight  or to lose weight. Ask what an ideal weight is for you.  Get at least 30 minutes of exercise that causes your heart to beat faster (aerobic exercise) most days of the week. Activities may include walking, swimming, or biking.  Work with your health care provider or diet and nutrition specialist (dietitian) to adjust your eating plan to your individual calorie needs. Reading food labels  Check food labels for the amount of sodium per serving. Choose foods with less than 5 percent of the Daily Value of sodium. Generally, foods with less than 300 mg of sodium per serving fit into this eating plan.  To find whole grains, look for the word "whole" as the first word in the ingredient list. Shopping  Buy products labeled as "low-sodium" or "no salt added."  Buy fresh foods. Avoid canned foods and premade or frozen meals. Cooking  Avoid adding salt when cooking. Use salt-free seasonings or herbs instead of table salt or sea salt. Check with your health care provider or pharmacist before using salt substitutes.  Do not fry foods. Cook foods using healthy methods such as baking, boiling, grilling, and broiling instead.  Cook with heart-healthy oils, such as olive, canola, soybean, or sunflower oil. Meal planning   Eat a balanced diet that includes: ? 5 or more servings of fruits and vegetables each day. At each meal, try to fill half of your plate with fruits and vegetables. ? Up to 6-8 servings of whole grains each day. ? Less than 6 oz of lean meat, poultry, or fish each day. A 3-oz serving of meat is about the same size as a deck of cards. One egg equals 1 oz. ? 2 servings of low-fat dairy each day. ? A serving of nuts, seeds, or beans 5 times each  week. ? Heart-healthy fats. Healthy fats called Omega-3 fatty acids are found in foods such as flaxseeds and coldwater fish, like sardines, salmon, and mackerel.  Limit how much you eat of the following: ? Canned or prepackaged foods. ? Food that is high in trans fat, such as fried foods. ? Food that is high in saturated fat, such as fatty meat. ? Sweets, desserts, sugary drinks, and other foods with added sugar. ? Full-fat dairy products.  Do not salt foods before eating.  Try to eat at least 2 vegetarian meals each week.  Eat more home-cooked food and less restaurant, buffet, and fast food.  When eating at a restaurant, ask that your food be prepared with less salt or no salt, if possible. What foods are recommended? The items listed may not be a complete list. Talk with your dietitian about what dietary choices are best for you. Grains Whole-grain or whole-wheat bread. Whole-grain or whole-wheat pasta. Brown rice. [place]row. Bulgur. Whole-grain and low-sodium cereals. Pita bread. Low-fat, low-sodium crackers. Whole-wheat flour tortillas. Vegetables Fresh or frozen vegetables (raw, steamed, roasted, or grilled). Low-sodium or reduced-sodium tomato and vegetable juice. Low-sodium or reduced-sodium tomato sauce and tomato paste. Low-sodium or reduced-sodium canned vegetables. Fruits All fresh, dried, or frozen fruit. Canned fruit in natural juice (without added sugar). Meat and other protein foods Skinless chicken or [place]. Ground chicken or [place]. Pork with fat trimmed off. Fish and seafood. Egg whites. Dried beans, peas, or lentils. Unsalted nuts, nut butters, and seeds. Unsalted canned beans. Lean cuts of beef with fat trimmed off. Low-sodium, lean deli meat. Dairy Low-fat (1%) or fat-free (skim) milk. Fat-free, low-fat, or reduced-fat cheeses. Nonfat, low-sodium ricotta or cottage cheese. Low-fat  or nonfat yogurt. Low-fat, low-sodium cheese. Fats and oils Soft margarine  without trans fats. Vegetable oil. Low-fat, reduced-fat, or light mayonnaise and salad dressings (reduced-sodium). Canola, safflower, olive, soybean, and sunflower oils. Avocado. Seasoning and other foods Herbs. Spices. Seasoning mixes without salt. Unsalted popcorn and pretzels. Fat-free sweets. What foods are not recommended? The items listed may not be a complete list. Talk with your dietitian about what dietary choices are best for you. Grains Baked goods made with fat, such as croissants, muffins, or some breads. Dry pasta or rice meal packs. Vegetables Creamed or fried vegetables. Vegetables in a cheese sauce. Regular canned vegetables (not low-sodium or reduced-sodium). Regular canned tomato sauce and paste (not low-sodium or reduced-sodium). Regular tomato and vegetable juice (not low-sodium or reduced-sodium). Angie Fava. Olives. Fruits Canned fruit in a light or heavy syrup. Fried fruit. Fruit in cream or butter sauce. Meat and other protein foods Fatty cuts of meat. Ribs. Fried meat. Berniece Salines. Sausage. Bologna and other processed lunch meats. Salami. Fatback. Hotdogs. Bratwurst. Salted nuts and seeds. Canned beans with added salt. Canned or smoked fish. Whole eggs or egg yolks. Chicken or [place] with skin. Dairy Whole or 2% milk, cream, and half-and-half. Whole or full-fat cream cheese. Whole-fat or sweetened yogurt. Full-fat cheese. Nondairy creamers. Whipped toppings. Processed cheese and cheese spreads. Fats and oils Butter. Stick margarine. Lard. Shortening. Ghee. Bacon fat. Tropical oils, such as coconut, palm kernel, or palm oil. Seasoning and other foods Salted popcorn and pretzels. Onion salt, garlic salt, seasoned salt, table salt, and sea salt. Worcestershire sauce. Tartar sauce. Barbecue sauce. Teriyaki sauce. Soy sauce, including reduced-sodium. Steak sauce. Canned and packaged gravies. Fish sauce. Oyster sauce. Cocktail sauce. Horseradish that you find on the shelf. Ketchup.  Mustard. Meat flavorings and tenderizers. Bouillon cubes. Hot sauce and Tabasco sauce. Premade or packaged marinades. Premade or packaged taco seasonings. Relishes. Regular salad dressings. Where to find more information:  National Heart, Lung, and [place]: https://wilson-eaton.com/  American Heart Association: www.heart.org Summary  The DASH eating plan is a healthy eating plan that has been shown to reduce high blood pressure (hypertension). It may also reduce your risk for type 2 diabetes, heart disease, and stroke.  With the DASH eating plan, you should limit salt (sodium) intake to 2,300 mg a day. If you have hypertension, you may need to reduce your sodium intake to 1,500 mg a day.  When on the DASH eating plan, aim to eat more fresh fruits and vegetables, whole grains, lean proteins, low-fat dairy, and heart-healthy fats.  Work with your health care provider or diet and nutrition specialist (dietitian) to adjust your eating plan to your individual calorie needs. This information is not intended to replace advice given to you by your health care provider. Make sure you discuss any questions you have with your health care provider. Document Released: 09/22/2011 Document Revised: 09/26/2016 Document Reviewed: 09/26/2016 Elsevier Interactive Patient Education  [employer].

## 2018-05-22 ENCOUNTER — Ambulatory Visit (INDEPENDENT_AMBULATORY_CARE_PROVIDER_SITE_OTHER): Payer: PPO | Admitting: Physician Assistant

## 2018-06-12 ENCOUNTER — Other Ambulatory Visit (INDEPENDENT_AMBULATORY_CARE_PROVIDER_SITE_OTHER): Payer: Self-pay | Admitting: Orthopaedic Surgery

## 2018-06-12 ENCOUNTER — Ambulatory Visit (INDEPENDENT_AMBULATORY_CARE_PROVIDER_SITE_OTHER): Payer: PPO | Admitting: Orthopaedic Surgery

## 2018-06-12 ENCOUNTER — Ambulatory Visit (INDEPENDENT_AMBULATORY_CARE_PROVIDER_SITE_OTHER): Payer: PPO

## 2018-06-12 ENCOUNTER — Encounter (INDEPENDENT_AMBULATORY_CARE_PROVIDER_SITE_OTHER): Payer: Self-pay | Admitting: Orthopaedic Surgery

## 2018-06-12 DIAGNOSIS — M25561 Pain in right knee: Secondary | ICD-10-CM

## 2018-06-12 MED ORDER — DICLOFENAC SODIUM 75 MG PO TBEC: 75.0000 mg | DELAYED_RELEASE_TABLET | Freq: Two times a day (BID) | ORAL | 1 refills | Status: DC
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
## 2018-06-15 ENCOUNTER — Other Ambulatory Visit (INDEPENDENT_AMBULATORY_CARE_PROVIDER_SITE_OTHER): Payer: PPO

## 2018-06-15 DIAGNOSIS — D649 Anemia, unspecified: Secondary | ICD-10-CM | POA: Diagnosis not present

## 2018-06-15 LAB — CBC WITH DIFFERENTIAL/PLATELET
Basophils Absolute: 0 10*3/uL (ref 0.0–0.1)
Basophils Relative: 0.6 % (ref 0.0–3.0)
EOS ABS: 0.2 10*3/uL (ref 0.0–0.7)
Eosinophils Relative: 2.8 % (ref 0.0–5.0)
HCT: 40.8 % (ref 39.0–52.0)
Hemoglobin: 13.3 g/dL (ref 13.0–17.0)
Lymphocytes Relative: 37.3 % (ref 12.0–46.0)
Lymphs Abs: 2.1 10*3/uL (ref 0.7–4.0)
MCHC: 32.6 g/dL (ref 30.0–36.0)
MCV: 89.1 fl (ref 78.0–100.0)
MONO ABS: 0.4 10*3/uL (ref 0.1–1.0)
Monocytes Relative: 7.7 % (ref 3.0–12.0)
NEUTROS ABS: 2.9 10*3/uL (ref 1.4–7.7)
NEUTROS PCT: 51.6 % (ref 43.0–77.0)
PLATELETS: 258 10*3/uL (ref 150.0–400.0)
RBC: 4.58 Mil/uL (ref 4.22–5.81)
RDW: 15.1 % (ref 11.5–15.5)
WBC: 5.5 10*3/uL (ref 4.0–10.5)

## 2018-06-20 ENCOUNTER — Ambulatory Visit (INDEPENDENT_AMBULATORY_CARE_PROVIDER_SITE_OTHER): Payer: PPO | Admitting: Orthopaedic Surgery

## 2018-06-20 ENCOUNTER — Encounter (INDEPENDENT_AMBULATORY_CARE_PROVIDER_SITE_OTHER): Payer: Self-pay | Admitting: Orthopaedic Surgery

## 2018-06-20 DIAGNOSIS — M25561 Pain in right knee: Secondary | ICD-10-CM | POA: Diagnosis not present

## 2018-06-20 NOTE — Progress Notes (Signed)
Patient is a very pleasant 72 year old gentleman well-known to me.  We saw him last week with acute flareup of right knee pain.  He offered a steroid injection in his knee at that visit but he was concerned about his blood glucose and was seeing his primary care physician later in the week.  We want to see him again this week to see how he is doing overall.  He does report his hemoglobin A1c is below 8.  He says his knee is feeling better today and so he like to hold off on any type of injection.  He said last night he was definitely considering injection.  He points the medial joint line as source of his pain.  He denies any locking catching.  On exam there is no knee joint effusion on the right knee.  His patella tracks well and his knee bends back and forth well.  He has only joint line tenderness on the medial aspect of the knee but a negative Murphy sign and negative Lockman's.  He does have varus malalignment.  His x-rays from last week did not show severe arthritic changes.  At this point we will follow him conservatively since he is doing better.  If this flares up again my next step would still be for him to consider steroid injection in that knee.  He is definitely also some on the MRI of his knee if he continues to have worsening problems or mechanical symptom all questions and concerns were answered and addressed.  Follow-up will be as needed otherwise.

## 2018-06-29 ENCOUNTER — Telehealth (INDEPENDENT_AMBULATORY_CARE_PROVIDER_SITE_OTHER): Payer: Self-pay | Admitting: Orthopaedic Surgery

## 2018-06-29 ENCOUNTER — Other Ambulatory Visit: Payer: Self-pay | Admitting: Family Medicine

## 2018-06-29 NOTE — Telephone Encounter (Signed)
Spoke with wife and made appt for Monday

## 2018-06-29 NOTE — Telephone Encounter (Signed)
Please advise 

## 2018-06-29 NOTE — Telephone Encounter (Signed)
He deferred the injection at his first and last visits.  We can see him Monday or Tuesday to inject his knee.

## 2018-06-29 NOTE — Telephone Encounter (Signed)
IC pt wife back and she stated that pt is in a lot of pain and wanted to know if he needed to come in to get injection or go have MRI of Knee. Please advise

## 2018-06-29 NOTE — Telephone Encounter (Signed)
Patient's wife Hoyle Sauer called asked when can patient be scheduled for an MRI. The number to contact Hoyle Sauer is [phone]

## 2018-07-02 ENCOUNTER — Encounter (INDEPENDENT_AMBULATORY_CARE_PROVIDER_SITE_OTHER): Payer: Self-pay | Admitting: Orthopaedic Surgery

## 2018-07-02 ENCOUNTER — Ambulatory Visit (INDEPENDENT_AMBULATORY_CARE_PROVIDER_SITE_OTHER): Payer: PPO | Admitting: Orthopaedic Surgery

## 2018-07-02 DIAGNOSIS — M25561 Pain in right knee: Secondary | ICD-10-CM | POA: Diagnosis not present

## 2018-07-02 MED ORDER — LIDOCAINE HCL 1 % IJ SOLN
3.0000 mL | INTRAMUSCULAR | Status: AC | PRN
  Administered 2018-07-02: 3 mL

## 2018-07-02 MED ORDER — METHYLPREDNISOLONE ACETATE 40 MG/ML IJ SUSP
40.0000 mg | INTRAMUSCULAR | Status: AC | PRN
  Administered 2018-07-02: 40 mg via INTRA_ARTICULAR
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
## 2018-07-03 ENCOUNTER — Ambulatory Visit (INDEPENDENT_AMBULATORY_CARE_PROVIDER_SITE_OTHER): Payer: PPO | Admitting: Endocrinology

## 2018-07-03 ENCOUNTER — Encounter: Payer: Self-pay | Admitting: Endocrinology

## 2018-07-03 VITALS — BP 118/80 | HR 84 | Ht 70.0 in | Wt 279.4 lb

## 2018-07-03 DIAGNOSIS — E1149 Type 2 diabetes mellitus with other diabetic neurological complication: Secondary | ICD-10-CM

## 2018-07-03 DIAGNOSIS — IMO0002 Reserved for concepts with insufficient information to code with codable children: Secondary | ICD-10-CM

## 2018-07-03 DIAGNOSIS — E1165 Type 2 diabetes mellitus with hyperglycemia: Secondary | ICD-10-CM | POA: Diagnosis not present

## 2018-07-03 LAB — POCT GLYCOSYLATED HEMOGLOBIN (HGB A1C): Hemoglobin A1C: 7.5 % — AB (ref 4.0–5.6)

## 2018-07-03 MED ORDER — PIOGLITAZONE HCL 15 MG PO TABS: 15.0000 mg | ORAL_TABLET | Freq: Every day | ORAL | 3 refills | Status: DC

## 2018-07-03 MED ORDER — BROMOCRIPTINE MESYLATE 2.5 MG PO TABS: 1.2500 mg | ORAL_TABLET | Freq: Every day | ORAL | 11 refills | Status: DC
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
## 2018-07-03 NOTE — Patient Instructions (Addendum)
check your blood sugar once a day.  vary the time of day when you check, between before the 3 meals, and at bedtime.  also check if you have symptoms of your blood sugar being too high or too low.  please keep a record of the readings and bring it to your next appointment here (or you can bring the meter itself).  You can write it on any piece of paper.  please call us sooner if your blood sugar goes below 70, or if you have a lot of readings over 200. I have sent prescriptions to your pharmacy, to increase the bromocriptine, and to decrease the pioglitazone.   Please continue the same other diabetes medications.   Our goals are: 1.  keep your a1c below 7.  2.  Avoid low blood sugar.  3.  Stay with generic meds if we can.  4. Reduce the leg swelling Please come back for a follow-up appointment in 2 months.

## 2018-07-14 ENCOUNTER — Other Ambulatory Visit: Payer: Self-pay | Admitting: Family Medicine

## 2018-07-14 ENCOUNTER — Other Ambulatory Visit: Payer: Self-pay | Admitting: Endocrinology

## 2018-07-22 ENCOUNTER — Other Ambulatory Visit: Payer: Self-pay | Admitting: Family Medicine

## 2018-07-22 DIAGNOSIS — I10 Essential (primary) hypertension: Secondary | ICD-10-CM

## 2018-07-30 ENCOUNTER — Other Ambulatory Visit: Payer: Self-pay | Admitting: Family Medicine

## 2018-07-30 DIAGNOSIS — J302 Other seasonal allergic rhinitis: Secondary | ICD-10-CM

## 2018-08-14 ENCOUNTER — Other Ambulatory Visit (INDEPENDENT_AMBULATORY_CARE_PROVIDER_SITE_OTHER): Payer: Self-pay | Admitting: Orthopaedic Surgery

## 2018-08-21 ENCOUNTER — Encounter: Payer: Self-pay | Admitting: Endocrinology

## 2018-08-21 ENCOUNTER — Ambulatory Visit (INDEPENDENT_AMBULATORY_CARE_PROVIDER_SITE_OTHER): Payer: PPO | Admitting: Endocrinology

## 2018-08-21 VITALS — BP 130/70 | HR 80 | Ht 70.0 in | Wt 278.8 lb

## 2018-08-21 DIAGNOSIS — E1149 Type 2 diabetes mellitus with other diabetic neurological complication: Secondary | ICD-10-CM | POA: Diagnosis not present

## 2018-08-21 DIAGNOSIS — IMO0002 Reserved for concepts with insufficient information to code with codable children: Secondary | ICD-10-CM

## 2018-08-21 DIAGNOSIS — E1165 Type 2 diabetes mellitus with hyperglycemia: Secondary | ICD-10-CM

## 2018-08-21 LAB — POCT GLYCOSYLATED HEMOGLOBIN (HGB A1C): HEMOGLOBIN A1C: 7.3 % — AB (ref 4.0–5.6)

## 2018-08-21 MED ORDER — BROMOCRIPTINE MESYLATE 2.5 MG PO TABS: 2.5000 mg | ORAL_TABLET | Freq: Every day | ORAL | 11 refills | Status: DC
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
## 2018-08-21 NOTE — Patient Instructions (Addendum)
check your blood sugar once a day.  vary the time of day when you check, between before the 3 meals, and at bedtime.  also check if you have symptoms of your blood sugar being too high or too low.  please keep a record of the readings and bring it to your next appointment here (or you can bring the meter itself).  You can write it on any piece of paper.  please call us sooner if your blood sugar goes below 70, or if you have a lot of readings over 200. I have sent a prescription to your pharmacy, to increase the bromocriptine to 1 pill per day.   Please continue the same other diabetes medications.   Our goals are: 1.  keep your a1c below 7.  2.  Avoid low blood sugar.  3.  Stay with generic meds if we can.  4. Reduce the leg swelling.   Please come back for a follow-up appointment in 3 months.

## 2018-09-07 ENCOUNTER — Other Ambulatory Visit: Payer: Self-pay | Admitting: Endocrinology

## 2018-09-15 ENCOUNTER — Other Ambulatory Visit (INDEPENDENT_AMBULATORY_CARE_PROVIDER_SITE_OTHER): Payer: Self-pay | Admitting: Orthopaedic Surgery

## 2018-09-17 ENCOUNTER — Other Ambulatory Visit: Payer: Self-pay | Admitting: Family Medicine
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
## 2018-09-18 ENCOUNTER — Ambulatory Visit (INDEPENDENT_AMBULATORY_CARE_PROVIDER_SITE_OTHER): Payer: PPO | Admitting: Orthopaedic Surgery

## 2018-09-18 ENCOUNTER — Encounter (INDEPENDENT_AMBULATORY_CARE_PROVIDER_SITE_OTHER): Payer: Self-pay | Admitting: Orthopaedic Surgery

## 2018-09-18 ENCOUNTER — Other Ambulatory Visit (INDEPENDENT_AMBULATORY_CARE_PROVIDER_SITE_OTHER): Payer: Self-pay

## 2018-09-18 ENCOUNTER — Ambulatory Visit (INDEPENDENT_AMBULATORY_CARE_PROVIDER_SITE_OTHER): Payer: PPO | Admitting: Family Medicine

## 2018-09-18 ENCOUNTER — Encounter: Payer: Self-pay | Admitting: Family Medicine

## 2018-09-18 VITALS — BP 100/78 | HR 75 | Temp 98.0°F | Ht 70.0 in | Wt 280.4 lb

## 2018-09-18 DIAGNOSIS — E1159 Type 2 diabetes mellitus with other circulatory complications: Secondary | ICD-10-CM | POA: Diagnosis not present

## 2018-09-18 DIAGNOSIS — E1169 Type 2 diabetes mellitus with other specified complication: Secondary | ICD-10-CM | POA: Diagnosis not present

## 2018-09-18 DIAGNOSIS — M25561 Pain in right knee: Secondary | ICD-10-CM | POA: Diagnosis not present

## 2018-09-18 DIAGNOSIS — E785 Hyperlipidemia, unspecified: Secondary | ICD-10-CM | POA: Diagnosis not present

## 2018-09-18 DIAGNOSIS — I152 Hypertension secondary to endocrine disorders: Secondary | ICD-10-CM

## 2018-09-18 DIAGNOSIS — Z23 Encounter for immunization: Secondary | ICD-10-CM

## 2018-09-18 DIAGNOSIS — IMO0002 Reserved for concepts with insufficient information to code with codable children: Secondary | ICD-10-CM

## 2018-09-18 DIAGNOSIS — E1165 Type 2 diabetes mellitus with hyperglycemia: Secondary | ICD-10-CM

## 2018-09-18 DIAGNOSIS — E1149 Type 2 diabetes mellitus with other diabetic neurological complication: Secondary | ICD-10-CM

## 2018-09-18 DIAGNOSIS — I10 Essential (primary) hypertension: Secondary | ICD-10-CM | POA: Diagnosis not present

## 2018-09-18 DIAGNOSIS — K7581 Nonalcoholic steatohepatitis (NASH): Secondary | ICD-10-CM

## 2018-09-18 DIAGNOSIS — G8929 Other chronic pain: Secondary | ICD-10-CM

## 2018-09-18 LAB — COMPREHENSIVE METABOLIC PANEL
ALT: 13 U/L (ref 0–53)
AST: 15 U/L (ref 0–37)
Albumin: 4.1 g/dL (ref 3.5–5.2)
Alkaline Phosphatase: 45 U/L (ref 39–117)
BILIRUBIN TOTAL: 0.5 mg/dL (ref 0.2–1.2)
BUN: 14 mg/dL (ref 6–23)
CALCIUM: 9.4 mg/dL (ref 8.4–10.5)
CO2: 28 mEq/L (ref 19–32)
CREATININE: 1.04 mg/dL (ref 0.40–1.50)
Chloride: 101 mEq/L (ref 96–112)
GFR: 90.81 mL/min (ref >60.00)
GLUCOSE: 183 mg/dL — AB (ref 70–99)
Potassium: 3.3 mEq/L — ABNORMAL LOW (ref 3.5–5.1)
Sodium: 139 mEq/L (ref 135–145)
Total Protein: 7 g/dL (ref 6.0–8.3)

## 2018-09-18 LAB — CBC
HCT: 40.3 % (ref 39.0–52.0)
Hemoglobin: 13.2 g/dL (ref 13.0–17.0)
MCHC: 32.9 g/dL (ref 30.0–36.0)
MCV: 87.8 fl (ref 78.0–100.0)
Platelets: 238 10*3/uL (ref 150.0–400.0)
RBC: 4.59 Mil/uL (ref 4.22–5.81)
RDW: 14 % (ref 11.5–15.5)
WBC: 9.1 10*3/uL (ref 4.0–10.5)

## 2018-09-18 NOTE — Addendum Note (Signed)
Addended by: Agnes Lawrence on: 09/18/2018 08:15 AM   Modules accepted: Orders

## 2018-09-18 NOTE — Patient Instructions (Addendum)
BEFORE YOU LEAVE: -flu shot -labs -follow up: 3-4 months  Call your eye doctor today to schedule your diabetic eye exam. Please bring a copy of the exam to our office.  We have ordered labs or studies at this visit. It can take up to 1-2 weeks for results and processing. IF results require follow up or explanation, we will call you with instructions. Clinically stable results will be released to your [facility]. If you have not heard from [place] or cannot find your results in [facility] in 2 weeks please contact our office at [phone].  If you are not yet signed up for [facility], please consider signing up.   We recommend the following healthy lifestyle for LIFE: 1) Small portions. But, make sure to get regular (at least 3 per day), healthy meals and small healthy snacks if needed.  2) Eat a healthy clean diet.   TRY TO EAT: -at least 5-7 servings of low sugar, colorful, and nutrient rich vegetables per day (not corn, potatoes or bananas.) -berries are the best choice if you wish to eat fruit (only eat small amounts if trying to reduce weight)  -lean meets (fish, white meat of chicken or [place]) -vegan proteins for some meals - beans or tofu, whole grains, nuts and seeds -Replace bad fats with good fats - good fats include: fish, nuts and seeds, canola oil, olive oil -small amounts of low fat or non fat dairy -small amounts of100 % whole grains - check the lables -drink plenty of water  AVOID: -SUGAR, sweets, anything with added sugar, corn syrup or sweeteners - must read labels as even foods advertised as "healthy" often are loaded with sugar -if you must have a sweetener, small amounts of stevia may be best -sweetened beverages and artificially sweetened beverages -simple starches (rice, bread, potatoes, pasta, chips, etc - small amounts of 100% whole grains are ok) -red meat, pork, butter -fried foods, fast food, processed food, excessive dairy, eggs and coconut.  3)Get at least 150  minutes of sweaty aerobic exercise per week.  4)Reduce stress - consider counseling, meditation and relaxation to balance other aspects of your life.
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
## 2018-09-26 ENCOUNTER — Other Ambulatory Visit: Payer: Self-pay | Admitting: Family Medicine

## 2018-09-30 ENCOUNTER — Ambulatory Visit
Admission: RE | Admit: 2018-09-30 | Discharge: 2018-09-30 | Disposition: A | Discharge status: Home / Self Care | Payer: PPO | Source: Ambulatory Visit | Attending: Physician Assistant | Admitting: Physician Assistant

## 2018-09-30 DIAGNOSIS — M23321 Other meniscus derangements, posterior horn of medial meniscus, right knee: Secondary | ICD-10-CM | POA: Diagnosis not present

## 2018-09-30 DIAGNOSIS — M25561 Pain in right knee: Principal | ICD-10-CM

## 2018-09-30 DIAGNOSIS — G8929 Other chronic pain: Secondary | ICD-10-CM

## 2018-10-03 ENCOUNTER — Encounter (INDEPENDENT_AMBULATORY_CARE_PROVIDER_SITE_OTHER): Payer: Self-pay | Admitting: Orthopaedic Surgery

## 2018-10-03 ENCOUNTER — Ambulatory Visit (INDEPENDENT_AMBULATORY_CARE_PROVIDER_SITE_OTHER): Payer: PPO | Admitting: Orthopaedic Surgery

## 2018-10-03 DIAGNOSIS — S83241D Other tear of medial meniscus, current injury, right knee, subsequent encounter: Secondary | ICD-10-CM

## 2018-10-03 NOTE — Progress Notes (Signed)
HPI: John Hobbs returns today to go over the MRI of his right knee.  He continues to have significant pain in the knee.  Knee does lock up on him at night whenever he is lying in bed.  He continues to have swelling in the knee. MRI dated 09/30/2018 right knee images are reviewed with the patient.  Has an extensive degenerative tear of the posterior horn and body of the medial meniscus.  The root of the meniscus is intact however the meniscus is partially extruded peripherally from the joint.  Patellofemoral mild chondromalacia.  Medial compartment with full-thickness chondral defect involving the central aspect of the medial femoral condyle.  Subchondral low signal and surrounding edema in the medial femoral condyle suspicious for a subchondral insufficiency fracture.  Physical exam: General well-developed well-nourished male no acute distress mood and affect appropriate. Right knee tenderness along medial joint line.  Slight effusion.  No abnormal warmth or erythema.  Ambulates with antalgic gait without assistive device.  Impression: Right knee medial meniscal tear  Plan: Discussed treatment with patient recommend knee arthroscopy.  He understands that doing partial medial meniscectomy could help with the effusion of the knee and the mechanical symptoms he is having but would not address the arthritic changes.  He could end up with worsening pain despite conservative treatment.  He may benefit from supplemental injection in future.  Risk and benefits discussed with patient.  He like to proceed with the right knee arthroscopy in the near future.

## 2018-10-04 ENCOUNTER — Other Ambulatory Visit (INDEPENDENT_AMBULATORY_CARE_PROVIDER_SITE_OTHER): Payer: Self-pay | Admitting: Orthopaedic Surgery

## 2018-10-04 ENCOUNTER — Encounter: Payer: Self-pay | Admitting: Orthopaedic Surgery

## 2018-10-04 DIAGNOSIS — M948X6 Other specified disorders of cartilage, lower leg: Secondary | ICD-10-CM | POA: Diagnosis not present

## 2018-10-04 DIAGNOSIS — G8918 Other acute postprocedural pain: Secondary | ICD-10-CM | POA: Diagnosis not present

## 2018-10-04 DIAGNOSIS — M23221 Derangement of posterior horn of medial meniscus due to old tear or injury, right knee: Secondary | ICD-10-CM | POA: Diagnosis not present

## 2018-10-04 DIAGNOSIS — M659 Synovitis and tenosynovitis, unspecified: Secondary | ICD-10-CM | POA: Diagnosis not present

## 2018-10-04 MED ORDER — HYDROCODONE-ACETAMINOPHEN 5-325 MG PO TABS: 1.0000 | ORAL_TABLET | Freq: Four times a day (QID) | ORAL | 0 refills | Status: DC | PRN

## 2018-10-11 ENCOUNTER — Ambulatory Visit (INDEPENDENT_AMBULATORY_CARE_PROVIDER_SITE_OTHER): Payer: PPO | Admitting: Orthopaedic Surgery

## 2018-10-11 ENCOUNTER — Encounter (INDEPENDENT_AMBULATORY_CARE_PROVIDER_SITE_OTHER): Payer: Self-pay | Admitting: Orthopaedic Surgery

## 2018-10-11 DIAGNOSIS — S83241D Other tear of medial meniscus, current injury, right knee, subsequent encounter: Secondary | ICD-10-CM

## 2018-10-11 DIAGNOSIS — Z9889 Other specified postprocedural states: Secondary | ICD-10-CM

## 2018-10-11 NOTE — Progress Notes (Signed)
The patient is status post a right knee arthroscopy.  We did this just a week ago.  Although he is 72 years old we found really good looking cartilage in his knee.  He is thinning down the medial compartment of his knee but there is no exposed bone.  He did have a complex medial meniscal tear.  We performed a partial medial meniscectomy.  His knee is doing well overall.  He has some occasional pains but he feels better than what he did before surgery.  He wants to get back to his exercise walking.  On exam he does have some mild effusion of his right knee.  It bends well and flexes and extends well.  I remove the sutures.  I showed him his arthroscopy pictures and went over with a knee model the extent of what we did.  All question concerns were answered and addressed.  He will resume his exercise walking at the end of next week.  We will see him back in 4 weeks to see how is doing overall.

## 2018-10-15 ENCOUNTER — Other Ambulatory Visit (INDEPENDENT_AMBULATORY_CARE_PROVIDER_SITE_OTHER): Payer: Self-pay | Admitting: Orthopaedic Surgery

## 2018-10-30 ENCOUNTER — Ambulatory Visit (INDEPENDENT_AMBULATORY_CARE_PROVIDER_SITE_OTHER): Payer: PPO | Admitting: Endocrinology

## 2018-10-30 ENCOUNTER — Encounter: Payer: Self-pay | Admitting: Endocrinology

## 2018-10-30 VITALS — BP 118/54 | HR 78 | Ht 70.0 in | Wt 270.0 lb

## 2018-10-30 DIAGNOSIS — E1149 Type 2 diabetes mellitus with other diabetic neurological complication: Secondary | ICD-10-CM | POA: Diagnosis not present

## 2018-10-30 DIAGNOSIS — IMO0002 Reserved for concepts with insufficient information to code with codable children: Secondary | ICD-10-CM

## 2018-10-30 DIAGNOSIS — E1165 Type 2 diabetes mellitus with hyperglycemia: Secondary | ICD-10-CM

## 2018-10-30 LAB — POCT GLYCOSYLATED HEMOGLOBIN (HGB A1C): Hemoglobin A1C: 7.7 % — AB (ref 4.0–5.6)

## 2018-10-30 MED ORDER — BROMOCRIPTINE MESYLATE 2.5 MG PO TABS: 2.5000 mg | ORAL_TABLET | Freq: Two times a day (BID) | ORAL | 11 refills | Status: DC

## 2018-10-30 NOTE — Patient Instructions (Addendum)
check your blood sugar once a day.  vary the time of day when you check, between before the 3 meals, and at bedtime.  also check if you have symptoms of your blood sugar being too high or too low.  please keep a record of the readings and bring it to your next appointment here (or you can bring the meter itself).  You can write it on any piece of paper.  please call us sooner if your blood sugar goes below 70, or if you have a lot of readings over 200. I have sent a prescription to your pharmacy, to increase the bromocriptine to twice a day.   Please continue the same other diabetes medications.   Our goals are: 1.  get your a1c below 7.  2.  Avoid low blood sugar.  3.  Stay with generic meds if we can.  4. Reduce the leg swelling.   Please come back for a follow-up appointment in 3 months.
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
## 2018-11-15 ENCOUNTER — Encounter (INDEPENDENT_AMBULATORY_CARE_PROVIDER_SITE_OTHER): Payer: Self-pay | Admitting: Orthopaedic Surgery

## 2018-11-15 ENCOUNTER — Ambulatory Visit (INDEPENDENT_AMBULATORY_CARE_PROVIDER_SITE_OTHER): Payer: PPO | Admitting: Orthopaedic Surgery

## 2018-11-15 DIAGNOSIS — Z9889 Other specified postprocedural states: Secondary | ICD-10-CM | POA: Diagnosis not present

## 2018-11-15 DIAGNOSIS — S83241D Other tear of medial meniscus, current injury, right knee, subsequent encounter: Secondary | ICD-10-CM | POA: Diagnosis not present

## 2018-11-15 MED ORDER — LIDOCAINE HCL 1 % IJ SOLN
3.0000 mL | INTRAMUSCULAR | Status: AC | PRN
  Administered 2018-11-15: 3 mL

## 2018-11-15 MED ORDER — METHYLPREDNISOLONE ACETATE 40 MG/ML IJ SUSP
40.0000 mg | INTRAMUSCULAR | Status: AC | PRN
  Administered 2018-11-15: 40 mg via INTRA_ARTICULAR
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
## 2018-11-21 ENCOUNTER — Ambulatory Visit: Payer: PPO | Admitting: Endocrinology

## 2018-12-17 ENCOUNTER — Ambulatory Visit (INDEPENDENT_AMBULATORY_CARE_PROVIDER_SITE_OTHER): Payer: PPO | Admitting: Family Medicine

## 2018-12-17 ENCOUNTER — Encounter: Payer: Self-pay | Admitting: Family Medicine

## 2018-12-17 VITALS — BP 102/80 | HR 74 | Temp 97.9°F | Ht 70.0 in | Wt 265.3 lb

## 2018-12-17 DIAGNOSIS — M25561 Pain in right knee: Secondary | ICD-10-CM | POA: Diagnosis not present

## 2018-12-17 DIAGNOSIS — E1165 Type 2 diabetes mellitus with hyperglycemia: Secondary | ICD-10-CM | POA: Diagnosis not present

## 2018-12-17 DIAGNOSIS — R8290 Unspecified abnormal findings in urine: Secondary | ICD-10-CM

## 2018-12-17 DIAGNOSIS — G8929 Other chronic pain: Secondary | ICD-10-CM

## 2018-12-17 DIAGNOSIS — E1149 Type 2 diabetes mellitus with other diabetic neurological complication: Secondary | ICD-10-CM

## 2018-12-17 DIAGNOSIS — IMO0002 Reserved for concepts with insufficient information to code with codable children: Secondary | ICD-10-CM

## 2018-12-17 LAB — POC URINALSYSI DIPSTICK (AUTOMATED)
Bilirubin, UA: NEGATIVE
Blood, UA: NEGATIVE
Glucose, UA: NEGATIVE
Ketones, UA: NEGATIVE
Leukocytes, UA: NEGATIVE
Nitrite, UA: NEGATIVE
Protein, UA: NEGATIVE
SPEC GRAV UA: 1.02 (ref 1.010–1.025)
Urobilinogen, UA: 0.2 E.U./dL
pH, UA: 6 (ref 5.0–8.0)

## 2018-12-17 NOTE — Patient Instructions (Signed)
BEFORE YOU LEAVE: -urine dip with reflex micro and culture if eneded -follow up: 2-3 months, sooner as needed  Follow up with your orthopedic specialist for you knee.  Follow up with your endocrinologist about your blood sugar. Eat a healthy low sugar diet and get regular gentle exercise as able. Can do core and upper body and cycling or water exercise if ok with your ortho doctor.  Recommend a 20 lb wt reduction as this would help your diabetes and your knee pain.
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
## 2018-12-17 NOTE — Addendum Note (Signed)
Addended by: Lahoma Crocker A on: 12/17/2018 10:20 AM   Modules accepted: Orders

## 2018-12-19 ENCOUNTER — Encounter (INDEPENDENT_AMBULATORY_CARE_PROVIDER_SITE_OTHER): Payer: Self-pay | Admitting: Physician Assistant

## 2018-12-19 ENCOUNTER — Ambulatory Visit (INDEPENDENT_AMBULATORY_CARE_PROVIDER_SITE_OTHER): Payer: PPO | Admitting: Physician Assistant

## 2018-12-19 VITALS — Ht 70.0 in | Wt 265.0 lb

## 2018-12-19 DIAGNOSIS — Z9889 Other specified postprocedural states: Secondary | ICD-10-CM | POA: Insufficient documentation

## 2018-12-19 NOTE — Progress Notes (Signed)
HPI: John Hobbs returns today follow-up of his right knee status post knee arthroscopy 09/25/2018.  He was found to have a complex medial meniscal tear and some mild thinning of the articular cartilage in medial compartment only.  He continues to have pain in the knee and swelling.  He saw Dr. Ninfa Linden on 11/15/2018 at that time the knee was aspirated and an injection of cortisone was given is unsure if this really helped.  Review of systems: No fevers or chills.  Otherwise noncontributory or negative  Physical exam: Right knee full extension flexion 210 degrees easily.  Mild effusion no abnormal warmth erythema port sites well-healed calf supple nontender.  Tenderness along medial joint line.  Impression: Status post right knee arthroscopy 09/25/2018  Plan: Knees prepped with Betadine ethyl chloride using a size skin and total of 3 cc lidocaine used to further excise skin from superior lateral approach.  Then 15 cc of blood-tinged synovial fluid was aspirated.  Patient tolerates well.  Knee is wrapped with an Ace bandage.  He will be Ace bandage on until this evening and then take it off.  Offered to order a supplemental injection he defers.  Therefore handouts given on it.  He will follow-up with [place] pain persist becomes worse or if he decides he would like to proceed with a supplemental injection in the knee.

## 2019-01-14 ENCOUNTER — Other Ambulatory Visit: Payer: Self-pay | Admitting: *Deleted

## 2019-01-14 DIAGNOSIS — I10 Essential (primary) hypertension: Secondary | ICD-10-CM

## 2019-01-14 MED ORDER — LOSARTAN POTASSIUM 100 MG PO TABS: 100.0000 mg | ORAL_TABLET | Freq: Every day | ORAL | 1 refills | Status: DC

## 2019-01-14 MED ORDER — ATENOLOL-CHLORTHALIDONE 100-25 MG PO TABS: 0.5000 | ORAL_TABLET | Freq: Every day | ORAL | 1 refills | Status: DC

## 2019-01-14 NOTE — Telephone Encounter (Signed)
Rx done. 

## 2019-01-15 ENCOUNTER — Telehealth (INDEPENDENT_AMBULATORY_CARE_PROVIDER_SITE_OTHER): Payer: Self-pay | Admitting: Physician Assistant

## 2019-01-15 NOTE — Telephone Encounter (Signed)
Please advise 

## 2019-01-15 NOTE — Telephone Encounter (Signed)
A message was left this morning stating that the patient's knee is not any better and that he can hadley walk.  CB#[phone].

## 2019-01-15 NOTE — Telephone Encounter (Signed)
There is really nothing else we can do right now.  We can inject it again with a steroid, but that is about it unfortunately.

## 2019-01-16 NOTE — Telephone Encounter (Signed)
Patient aware states he doesn't want a cortisone injection, he states this doesn't work

## 2019-01-21 ENCOUNTER — Ambulatory Visit: Payer: PPO | Admitting: Family Medicine

## 2019-02-12 ENCOUNTER — Other Ambulatory Visit: Payer: Self-pay | Admitting: Family Medicine

## 2019-02-12 DIAGNOSIS — J302 Other seasonal allergic rhinitis: Secondary | ICD-10-CM

## 2019-02-13 ENCOUNTER — Other Ambulatory Visit: Payer: Self-pay | Admitting: Family Medicine

## 2019-03-14 ENCOUNTER — Other Ambulatory Visit: Payer: Self-pay | Admitting: Family Medicine

## 2019-03-15 ENCOUNTER — Other Ambulatory Visit: Payer: Self-pay

## 2019-03-15 ENCOUNTER — Ambulatory Visit (INDEPENDENT_AMBULATORY_CARE_PROVIDER_SITE_OTHER): Payer: PPO | Admitting: Family Medicine

## 2019-03-15 ENCOUNTER — Encounter: Payer: Self-pay | Admitting: Family Medicine

## 2019-03-15 DIAGNOSIS — E782 Mixed hyperlipidemia: Secondary | ICD-10-CM | POA: Diagnosis not present

## 2019-03-15 DIAGNOSIS — E1165 Type 2 diabetes mellitus with hyperglycemia: Secondary | ICD-10-CM

## 2019-03-15 DIAGNOSIS — IMO0002 Reserved for concepts with insufficient information to code with codable children: Secondary | ICD-10-CM

## 2019-03-15 DIAGNOSIS — M25561 Pain in right knee: Secondary | ICD-10-CM | POA: Diagnosis not present

## 2019-03-15 DIAGNOSIS — I10 Essential (primary) hypertension: Secondary | ICD-10-CM

## 2019-03-15 DIAGNOSIS — E1149 Type 2 diabetes mellitus with other diabetic neurological complication: Secondary | ICD-10-CM

## 2019-03-15 NOTE — Progress Notes (Signed)
Virtual Visit via Telephone Note  I connected with John Hobbs on 03/15/19 at  8:00 AM EDT by telephone and verified that I am speaking with the correct person using two identifiers.   I discussed the limitations, risks, security and privacy concerns of performing an evaluation and management service by telephone and the availability of in person appointments. I also discussed with the patient that there may be a patient responsible charge related to this service. The patient expressed understanding and agreed to proceed.  Location patient: home Location provider: work or home office Participants present for the call: patient, provider Patient did not have a visit in the prior 7 days to address this/these issue(s).   History of Present Illness: Pt seen for follow up on chronic conditions and TOC, previously seen by Dr. Maudie Mercury.  DM II:  Followed by Endo.  Taking Metformin, actos, prandin, bromocriptine, losartan.   fsbs 90s-100.  Eating grits, coffee, eggs, hashbrowns, sausage this am for breakfast.  Not exercising 2/2 R knee pain.  HTN:  Norvasc 5 mg, losartan, atenolol-chlorthalidone.  Checks bp ocassionally  HLD:  Taking lipitor 20 mg.  Denies myalgias.  R knee Meniscus tear:  Followed by Ortho, Dr. Ninfa Linden.  Had R knee arthroscopy in Dec 2019.  States it is still hurting.  Did not have PT.  Had fluid removed 1.5 mo ago and steroid injections.   Taking Aleeve BID.   Observations/Objective: Patient sounds cheerful and well on the phone. I do not appreciate any SOB. Speech and thought processing are grossly intact. Patient reported vitals:  Assessment and Plan: Type 2 diabetes mellitus with neurological manifestations, uncontrolled (HCC) -hgb A1C 7.7% on 10/30/18 -discussed lifestyle modifications.  Consider chair exercises given knee pain. -continue f/u with Endo, Dr. Loanne Drilling -continue current meds: Metformin, Actos, prandin 4 mg TID, bromocriptine 2.5 mg BID  Essential  hypertension -continue current meds: norvasc 5 mg, losartan 100 mg, atenolol-chlorthalidone 100-25 mg take 1/2 a tab daily. -pt encouraged to check bp at home -lifestyle modifications encouraged  Mixed hyperlipidemia -continue lipitor 20 mg -lifestyle modifications encouraged  Recurrent pain of right knee -continue f/u with Ortho, Dr. Ninfa Linden   Follow Up Instructions: F/u prn in 2-3 months for wellness visit  I did not refer this patient for an OV in the next 24 hours for this/these issue(s).  I discussed the assessment and treatment plan with the patient. The patient was provided an opportunity to ask questions and all were answered. The patient agreed with the plan and demonstrated an understanding of the instructions.   The patient was advised to call back or seek an in-person evaluation if the symptoms worsen or if the condition fails to improve as anticipated.  I provided 11 minutes of non-face-to-face time during this encounter.   Billie Ruddy, MD

## 2019-03-19 ENCOUNTER — Encounter: Payer: Self-pay | Admitting: Endocrinology

## 2019-03-19 ENCOUNTER — Other Ambulatory Visit: Payer: Self-pay

## 2019-03-19 ENCOUNTER — Ambulatory Visit (INDEPENDENT_AMBULATORY_CARE_PROVIDER_SITE_OTHER): Payer: PPO | Admitting: Endocrinology

## 2019-03-19 VITALS — BP 136/82 | HR 73 | Temp 97.7°F | Wt 280.2 lb

## 2019-03-19 DIAGNOSIS — E1165 Type 2 diabetes mellitus with hyperglycemia: Secondary | ICD-10-CM | POA: Diagnosis not present

## 2019-03-19 DIAGNOSIS — E1149 Type 2 diabetes mellitus with other diabetic neurological complication: Secondary | ICD-10-CM

## 2019-03-19 DIAGNOSIS — IMO0002 Reserved for concepts with insufficient information to code with codable children: Secondary | ICD-10-CM

## 2019-03-19 DIAGNOSIS — E1140 Type 2 diabetes mellitus with diabetic neuropathy, unspecified: Secondary | ICD-10-CM | POA: Diagnosis not present

## 2019-03-19 LAB — POCT GLYCOSYLATED HEMOGLOBIN (HGB A1C): Hemoglobin A1C: 6.9 % — AB (ref 4.0–5.6)
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
## 2019-03-19 NOTE — Patient Instructions (Addendum)
check your blood sugar once a day.  vary the time of day when you check, between before the 3 meals, and at bedtime.  also check if you have symptoms of your blood sugar being too high or too low.  please keep a record of the readings and bring it to your next appointment here (or you can bring the meter itself).  You can write it on any piece of paper.  please call us sooner if your blood sugar goes below 70, or if you have a lot of readings over 200. Please stop taking the pioglitazone, and: continue the same other diabetes medications.   Our goals are: 1.  get your a1c below 7.  2.  Avoid low blood sugar.  3.  Stay with generic meds if we can.  4. Reduce the leg swelling.   Please come back for a follow-up appointment in 3 months.

## 2019-04-17 ENCOUNTER — Ambulatory Visit (INDEPENDENT_AMBULATORY_CARE_PROVIDER_SITE_OTHER): Payer: PPO | Admitting: Orthopaedic Surgery

## 2019-04-17 ENCOUNTER — Other Ambulatory Visit: Payer: Self-pay | Admitting: Orthopaedic Surgery

## 2019-04-17 ENCOUNTER — Encounter: Payer: Self-pay | Admitting: Orthopaedic Surgery

## 2019-04-17 ENCOUNTER — Other Ambulatory Visit: Payer: Self-pay

## 2019-04-17 DIAGNOSIS — M25561 Pain in right knee: Secondary | ICD-10-CM | POA: Diagnosis not present

## 2019-04-17 DIAGNOSIS — G8929 Other chronic pain: Secondary | ICD-10-CM

## 2019-04-17 MED ORDER — NABUMETONE 750 MG PO TABS: 750.0000 mg | ORAL_TABLET | Freq: Two times a day (BID) | ORAL | 1 refills | Status: DC | PRN

## 2019-04-17 NOTE — Progress Notes (Signed)
Patient comes in today with recent right knee swelling.  He is an active 73 year old gentleman.  He is 6 months out from an arthroscopic intervention of his right knee when he had a scope for a medial meniscal tear.  We did find grade 2 to grade III chondromalacia the medial compartment of his knee.  He is a diabetic.  He comes in today for further relation treatment of right knee pain and swelling.  On exam he does have varus malalignment of his right knee.  There is not an effusion on his knee today.  He does have medial joint line tenderness but good range of motion of the knee.  We went over his arthroscopy pictures from 6 months ago.  He is obviously having some worsening arthritic symptoms in that knee.  I talked about a possible steroid injection or hyaluronic acid or or accommodation of both.  He would rather not have any type of needle put in his knee.  With that being said I recommended Voltaren gel as well as quad strengthening exercises and will send in some Relafen for him.  All questions concerns were answered and addressed.  Follow-up as otherwise as needed.

## 2019-04-25 ENCOUNTER — Encounter: Payer: Self-pay | Admitting: Family Medicine

## 2019-04-25 ENCOUNTER — Other Ambulatory Visit: Payer: Self-pay

## 2019-04-25 ENCOUNTER — Ambulatory Visit (INDEPENDENT_AMBULATORY_CARE_PROVIDER_SITE_OTHER): Payer: PPO | Admitting: Family Medicine

## 2019-04-25 VITALS — BP 138/88 | HR 84 | Temp 97.9°F | Wt 278.1 lb

## 2019-04-25 DIAGNOSIS — B029 Zoster without complications: Secondary | ICD-10-CM

## 2019-04-25 MED ORDER — VALACYCLOVIR HCL 1 G PO TABS: 1000.0000 mg | ORAL_TABLET | Freq: Three times a day (TID) | ORAL | 0 refills | Status: DC

## 2019-04-25 NOTE — Progress Notes (Signed)
   Subjective:    Patient ID: John Hobbs, male    DOB: 08-May-1946, 73 y.o.   MRN: [id number]  HPI Here for 5 days of a painful blister on the left foot. No recent trauma that he knows of. He has applied Neosporin.    Review of Systems  Constitutional: Negative.   Respiratory: Negative.   Cardiovascular: Negative.   Skin: Positive for rash.       Objective:   Physical Exam Constitutional:      Appearance: Normal appearance.  Cardiovascular:     Rate and Rhythm: Normal rate and regular rhythm.     Pulses: Normal pulses.     Heart sounds: Normal heart sounds.  Pulmonary:     Effort: Pulmonary effort is normal.     Breath sounds: Normal breath sounds.  Skin:    Comments: There is a cluster of red vesicles on the medial left foot   Neurological:     Mental Status: He is alert.           Assessment & Plan:  This is shingles. Treat with Valtrex for 10 days. I advised him to get the Shingrix vaccine after this resolves. He will discuss this with Dr. Maudie Mercury.  Alysia Penna, MD

## 2019-05-02 ENCOUNTER — Other Ambulatory Visit: Payer: Self-pay | Admitting: Family Medicine

## 2019-05-03 ENCOUNTER — Telehealth: Payer: Self-pay | Admitting: Orthopaedic Surgery

## 2019-05-03 NOTE — Telephone Encounter (Signed)
Patient's wife Hoyle Sauer lmom requesting to get auth approval for the gel injections.  She also states patient was given Voltaren gel but it contains a medicine that the patient could not take and wants to know if he should use the Voltaren gel

## 2019-05-03 NOTE — Telephone Encounter (Signed)
Can we get auth for gel injections for him please

## 2019-05-03 NOTE — Telephone Encounter (Signed)
Talked with patient's wife Hoyle Sauer and advised her Dr. Trevor Mace message below.  Stated that she understands and that she will let the patient know.

## 2019-05-03 NOTE — Telephone Encounter (Signed)
Noted  

## 2019-05-03 NOTE — Telephone Encounter (Signed)
He will be fine using the Voltaren Gel since it is topical and has a very low systemic absorbtion.

## 2019-05-03 NOTE — Telephone Encounter (Signed)
Please advise on message concerning Voltaren Gel.  Thank you.

## 2019-05-06 ENCOUNTER — Telehealth: Payer: Self-pay

## 2019-05-06 NOTE — Telephone Encounter (Signed)
Submitted VOB for Monovisc, right knee. 

## 2019-05-10 ENCOUNTER — Telehealth: Payer: Self-pay

## 2019-05-10 NOTE — Telephone Encounter (Signed)
Talked with patient's wife Hoyle Sauer and advised her that patient is approved for gel injection.  Approved for Monovisc, right knee. [place] Patient will be responsible for 20% OOP. No Co-pay No PA required  Appt. 05/13/2019 with Dr. Ninfa Linden

## 2019-05-13 ENCOUNTER — Encounter: Payer: Self-pay | Admitting: Orthopaedic Surgery

## 2019-05-13 ENCOUNTER — Ambulatory Visit (INDEPENDENT_AMBULATORY_CARE_PROVIDER_SITE_OTHER): Payer: PPO | Admitting: Orthopaedic Surgery

## 2019-05-13 DIAGNOSIS — M1711 Unilateral primary osteoarthritis, right knee: Secondary | ICD-10-CM | POA: Diagnosis not present

## 2019-05-13 MED ORDER — HYALURONAN 88 MG/4ML IX SOSY
88.0000 mg | PREFILLED_SYRINGE | INTRA_ARTICULAR | Status: AC | PRN
  Administered 2019-05-13: 88 mg via INTRA_ARTICULAR

## 2019-05-13 NOTE — Progress Notes (Signed)
   Procedure Note  Patient: John Hobbs             Date of Birth: 1946-10-05           MRN: [id number]             Visit Date: 05/13/2019  Procedures: Visit Diagnoses:  1. Unilateral primary osteoarthritis, right knee     Large Joint Inj: R knee on 05/13/2019 1:51 PM Indications: diagnostic evaluation and pain Details: 22 G 1.5 in needle, superolateral approach  Arthrogram: No  Medications: 88 mg Hyaluronan 88 MG/4ML Outcome: tolerated well, no immediate complications Procedure, treatment alternatives, risks and benefits explained, specific risks discussed. Consent was given by the patient. Immediately prior to procedure a time out was called to verify the correct patient, procedure, equipment, support staff and site/side marked as required. Patient was prepped and draped in the usual sterile fashion.    The patient is coming in today for scheduled hyaluronic acid injection into his right knee to treat the pain from osteoarthritis.  He is tried and failed other conservative treatment measures.  Steroid injections of increase his blood glucose too much and have not worked as much as we have liked either.  This is the next step for him.  He understands fully the risk and benefits of these type of injections and why we are trying this.  On examination of his right knee there is no effusion but definitely significant medial joint line tenderness with good range of motion.  I did place the Monovisc injection in his right knee without difficulty.  All question concerns were answered and addressed.  Follow-up will be as needed.

## 2019-05-16 ENCOUNTER — Ambulatory Visit: Payer: PPO

## 2019-06-03 ENCOUNTER — Ambulatory Visit: Payer: PPO | Admitting: Endocrinology

## 2019-06-03 DIAGNOSIS — Z0289 Encounter for other administrative examinations: Secondary | ICD-10-CM

## 2019-06-19 ENCOUNTER — Ambulatory Visit: Payer: PPO | Admitting: Endocrinology

## 2019-06-25 ENCOUNTER — Other Ambulatory Visit: Payer: Self-pay

## 2019-06-26 ENCOUNTER — Encounter: Payer: Self-pay | Admitting: Endocrinology

## 2019-06-26 ENCOUNTER — Ambulatory Visit (INDEPENDENT_AMBULATORY_CARE_PROVIDER_SITE_OTHER): Payer: PPO | Admitting: Endocrinology

## 2019-06-26 VITALS — BP 120/78 | HR 102 | Ht 70.0 in | Wt 268.2 lb

## 2019-06-26 DIAGNOSIS — E1149 Type 2 diabetes mellitus with other diabetic neurological complication: Secondary | ICD-10-CM

## 2019-06-26 DIAGNOSIS — E119 Type 2 diabetes mellitus without complications: Secondary | ICD-10-CM | POA: Diagnosis not present

## 2019-06-26 DIAGNOSIS — R609 Edema, unspecified: Secondary | ICD-10-CM

## 2019-06-26 DIAGNOSIS — IMO0002 Reserved for concepts with insufficient information to code with codable children: Secondary | ICD-10-CM

## 2019-06-26 LAB — POCT GLYCOSYLATED HEMOGLOBIN (HGB A1C): Hemoglobin A1C: 9.4 % — AB (ref 4.0–5.6)

## 2019-06-26 MED ORDER — CANAGLIFLOZIN 300 MG PO TABS: 300.0000 mg | ORAL_TABLET | Freq: Every day | ORAL | 11 refills | Status: DC
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
## 2019-06-26 NOTE — Patient Instructions (Addendum)
check your blood sugar once a day.  vary the time of day when you check, between before the 3 meals, and at bedtime.  also check if you have symptoms of your blood sugar being too high or too low.  please keep a record of the readings and bring it to your next appointment here (or you can bring the meter itself).  You can write it on any piece of paper.  please call us sooner if your blood sugar goes below 70, or if you have a lot of readings over 200. I have sent a prescription to your pharmacy, to add "Invokana." continue the same other diabetes medications.   Our goals are: 1.  get your a1c below 7.  2.  Avoid low blood sugar.  Please come back for a follow-up appointment in 2 weeks.

## 2019-07-01 ENCOUNTER — Ambulatory Visit: Payer: PPO

## 2019-07-05 ENCOUNTER — Telehealth: Payer: Self-pay | Admitting: *Deleted

## 2019-07-05 NOTE — Telephone Encounter (Signed)
Copied from [place] [id number]. Topic: General - Other >> Jul 04, 2019  4:32 PM Pauline Good wrote: Reason for CRM: pt need to sched Flu Shot/Pneumonia/Shingles. Pt also need Tetanus shot if he hasn't had one per wife calling >> Jul 05, 2019  8:46 AM Cox, Melburn Hake, CMA wrote: Please advise on which vaccines pt needs.

## 2019-07-08 ENCOUNTER — Other Ambulatory Visit: Payer: Self-pay

## 2019-07-08 NOTE — Telephone Encounter (Signed)
Copied from [place] [id number]. Topic: General - Other >> Jul 08, 2019 11:51 AM Mathis Bud wrote: Patient is checking status for a call back from nurse to reschedule. Call back [phone]

## 2019-07-08 NOTE — Telephone Encounter (Signed)
Pt and wife are aware that we do not give shingrix vaccine for the age over 38 yrs, pt advised to call their insurance and  pharmacy for more information regarding Shingrix. Both pt have been scheduled for their flu vaccine in the [place] location on Saturday, pt also awaere that they are not due for their TDAP vaccine, verbalized understanding

## 2019-07-10 ENCOUNTER — Encounter: Payer: PPO | Attending: Endocrinology | Admitting: Nutrition

## 2019-07-10 ENCOUNTER — Ambulatory Visit: Payer: PPO | Admitting: Endocrinology

## 2019-07-10 ENCOUNTER — Other Ambulatory Visit: Payer: Self-pay

## 2019-07-10 DIAGNOSIS — E1149 Type 2 diabetes mellitus with other diabetic neurological complication: Secondary | ICD-10-CM | POA: Diagnosis not present

## 2019-07-10 DIAGNOSIS — IMO0002 Reserved for concepts with insufficient information to code with codable children: Secondary | ICD-10-CM

## 2019-07-10 DIAGNOSIS — E1165 Type 2 diabetes mellitus with hyperglycemia: Secondary | ICD-10-CM | POA: Diagnosis not present

## 2019-07-10 NOTE — Progress Notes (Signed)
Patient did not bring his meter today.  Said FBS was 133 yesterday.  Says has cut back on foods and feels like he has lost weight from visit with dr. Loanne Drilling last week.  Typical day: 7-8 Up tests blood sugar.  Did not test today.  8-9 bfast:  Oatmeal with whole banana, strawberries after and water to drink 10-4PM: snacks on fruit all day...grapes, apples, orange, blueberries, etc.   6PM: supper,  Protein-6-7 ounces, 2 servings of a starchy veg. Like potato with butter, and 2 servings of non starchy veg.  Has given up bread with supper X past  Days.  Also has fruit with dinner.   7-10  More fruit.  Was eating sweets like ice cream and cookies.  Not doing that now.  Exercise:  Was walking for 30-50 min. A few times /wk.  Has not lately, bad knees. Patient would like to loose weight.   Discussed the idea that snacking all day of fruit is continually raising blood sugar without allowing it to come back down.  Suggestions given: 1. add protein to breakfast and reduce fruit to 1/2 banana and small amount of stawberries.  He agreed to do this.  She with protein suggestions given to him.   2. eat some kind of protein at 1-2PM, if he is not eating a sandwich, to help him to feel full so that he is not snacking all the time.  He is agreeable to eating a lunch.  Suggestions were given for this, like a sandwich with one piece of fruit, or to hold the fruit for 1-2 hours later.  He agreed to do this. 3.  Test blood sugar twice daily:  Before breakfast and then either before lunch, supper or bedtime--vary the times each day. 4.  Call in one week with blood sugar readings.

## 2019-07-11 ENCOUNTER — Encounter: Payer: Self-pay | Admitting: Endocrinology

## 2019-07-11 ENCOUNTER — Ambulatory Visit (INDEPENDENT_AMBULATORY_CARE_PROVIDER_SITE_OTHER): Payer: PPO | Admitting: Endocrinology

## 2019-07-11 VITALS — BP 120/78 | HR 93 | Ht 70.0 in | Wt 265.4 lb

## 2019-07-11 DIAGNOSIS — E1149 Type 2 diabetes mellitus with other diabetic neurological complication: Secondary | ICD-10-CM

## 2019-07-11 DIAGNOSIS — IMO0002 Reserved for concepts with insufficient information to code with codable children: Secondary | ICD-10-CM

## 2019-07-11 DIAGNOSIS — E1165 Type 2 diabetes mellitus with hyperglycemia: Secondary | ICD-10-CM

## 2019-07-11 LAB — BASIC METABOLIC PANEL
BUN: 17 mg/dL (ref 6–23)
CO2: 28 mEq/L (ref 19–32)
Calcium: 10.2 mg/dL (ref 8.4–10.5)
Chloride: 102 mEq/L (ref 96–112)
Creatinine, Ser: 1.2 mg/dL (ref 0.40–1.50)
GFR: 72.26 mL/min (ref >60.00)
Glucose, Bld: 214 mg/dL — ABNORMAL HIGH (ref 70–99)
Potassium: 3.8 mEq/L (ref 3.5–5.1)
Sodium: 140 mEq/L (ref 135–145)

## 2019-07-11 NOTE — Patient Instructions (Addendum)
check your blood sugar once a day.  vary the time of day when you check, between before the 3 meals, and at bedtime.  also check if you have symptoms of your blood sugar being too high or too low.  please keep a record of the readings and bring it to your next appointment here (or you can bring the meter itself).  You can write it on any piece of paper.  please call us sooner if your blood sugar goes below 70, or if you have a lot of readings over 200. Blood tests are requested for you today.  We'll let you know about the results.  If it is high, we'll add another diabetes pill.  If it is very high, you should consider taking a once a week non-insulin injections.   continue the same other diabetes medications.   Our goals are: 1.  get your a1c below 7.  2.  Avoid low blood sugar.  Please come back for a follow-up appointment in 1 month.
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
## 2019-07-11 NOTE — Patient Instructions (Signed)
  1. add protein to breakfast and reduce fruit to 1/2 banana and small amount of stawberries.  He agreed to do this.  She with protein suggestions given to him.   2. eat some kind of protein at 1-2PM, if he is not eating a sandwich, to help him to feel full so that he is not snacking all the time.  He is agreeable to eating a lunch.  Suggestions were given for this, like a sandwich with one piece of fruit, or to hold the fruit for 1-2 hours later.  He agreed to do this. 3.  Test blood sugar twice daily:  Before breakfast and then either before lunch, supper or bedtime--vary the times each day. 4.  Call in one week with blood sugar readings.

## 2019-07-13 ENCOUNTER — Ambulatory Visit: Payer: PPO

## 2019-07-18 LAB — FRUCTOSAMINE: Fructosamine: 334 umol/L — ABNORMAL HIGH (ref 205–285)

## 2019-07-19 ENCOUNTER — Other Ambulatory Visit: Payer: Self-pay | Admitting: Endocrinology

## 2019-07-19 MED ORDER — SITAGLIPTIN PHOSPHATE 100 MG PO TABS: 100.0000 mg | ORAL_TABLET | Freq: Every day | ORAL | 11 refills | Status: DC

## 2019-07-21 ENCOUNTER — Other Ambulatory Visit: Payer: Self-pay | Admitting: Family Medicine

## 2019-07-21 DIAGNOSIS — I10 Essential (primary) hypertension: Secondary | ICD-10-CM

## 2019-07-23 ENCOUNTER — Other Ambulatory Visit: Payer: Self-pay

## 2019-07-23 DIAGNOSIS — IMO0002 Reserved for concepts with insufficient information to code with codable children: Secondary | ICD-10-CM

## 2019-07-23 DIAGNOSIS — E1149 Type 2 diabetes mellitus with other diabetic neurological complication: Secondary | ICD-10-CM

## 2019-07-23 MED ORDER — BROMOCRIPTINE MESYLATE 2.5 MG PO TABS: 2.5000 mg | ORAL_TABLET | Freq: Two times a day (BID) | ORAL | 11 refills | Status: DC

## 2019-08-08 ENCOUNTER — Other Ambulatory Visit: Payer: Self-pay

## 2019-08-12 ENCOUNTER — Ambulatory Visit (INDEPENDENT_AMBULATORY_CARE_PROVIDER_SITE_OTHER): Payer: PPO | Admitting: Endocrinology

## 2019-08-12 ENCOUNTER — Other Ambulatory Visit: Payer: Self-pay

## 2019-08-12 ENCOUNTER — Encounter: Payer: Self-pay | Admitting: Endocrinology

## 2019-08-12 VITALS — BP 106/62 | HR 85 | Ht 70.0 in | Wt 263.4 lb

## 2019-08-12 DIAGNOSIS — E1149 Type 2 diabetes mellitus with other diabetic neurological complication: Secondary | ICD-10-CM | POA: Diagnosis not present

## 2019-08-12 DIAGNOSIS — E1165 Type 2 diabetes mellitus with hyperglycemia: Secondary | ICD-10-CM | POA: Diagnosis not present

## 2019-08-12 DIAGNOSIS — IMO0002 Reserved for concepts with insufficient information to code with codable children: Secondary | ICD-10-CM

## 2019-08-12 LAB — POCT GLYCOSYLATED HEMOGLOBIN (HGB A1C): Hemoglobin A1C: 8.2 % — AB (ref 4.0–5.6)

## 2019-08-12 MED ORDER — INSULIN NPH (HUMAN) (ISOPHANE) 100 UNIT/ML ~~LOC~~ SUSP: 30.0000 [IU] | SUBCUTANEOUS | 11 refills | Status: DC
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
## 2019-08-12 NOTE — Patient Instructions (Addendum)
check your blood sugar once a day.  vary the time of day when you check, between before the 3 meals, and at bedtime.  also check if you have symptoms of your blood sugar being too high or too low.  please keep a record of the readings and bring it to your next appointment here (or you can bring the meter itself).  You can write it on any piece of paper.  please call us sooner if your blood sugar goes below 70, or if you have a lot of readings over 200. I have sent a prescription to your pharmacy, to start the NPH insulin. On this type of insulin schedule, you should eat meals on a regular schedule.  If a meal is missed or significantly delayed, your blood sugar could go low.  Please continue the same other diabetes medications.   Our goals are: 1.  get your a1c into the 7's  2.  Avoid low blood sugar.   Please come back for a follow-up appointment in 2 weeks.

## 2019-08-14 ENCOUNTER — Ambulatory Visit: Payer: PPO | Admitting: Nutrition

## 2019-08-16 ENCOUNTER — Telehealth: Payer: Self-pay

## 2019-08-16 ENCOUNTER — Other Ambulatory Visit: Payer: Self-pay

## 2019-08-16 DIAGNOSIS — E1149 Type 2 diabetes mellitus with other diabetic neurological complication: Secondary | ICD-10-CM

## 2019-08-16 DIAGNOSIS — IMO0002 Reserved for concepts with insufficient information to code with codable children: Secondary | ICD-10-CM

## 2019-08-16 DIAGNOSIS — E1165 Type 2 diabetes mellitus with hyperglycemia: Secondary | ICD-10-CM

## 2019-08-16 MED ORDER — INSULIN NPH (HUMAN) (ISOPHANE) 100 UNIT/ML ~~LOC~~ SUSP: 35.0000 [IU] | SUBCUTANEOUS | 11 refills | Status: DC

## 2019-08-16 NOTE — Telephone Encounter (Signed)
Please advise 

## 2019-08-16 NOTE — Telephone Encounter (Signed)
Please increase to 35 units qam.  This is good progress. I'll see you next week.

## 2019-08-16 NOTE — Telephone Encounter (Signed)
Patients wife called in stating patient has had a low of 163 and a high of 344. Patient wants to know if he needs to come back to doctor to be elevated or go back on diabetic medication    Please call and advise

## 2019-08-16 NOTE — Telephone Encounter (Signed)
Called pt and informed of new orders. Verbalized acceptance and understanding. 

## 2019-08-17 ENCOUNTER — Emergency Department (HOSPITAL_COMMUNITY): Payer: PPO

## 2019-08-17 ENCOUNTER — Encounter (HOSPITAL_COMMUNITY): Payer: Self-pay | Admitting: Emergency Medicine

## 2019-08-17 ENCOUNTER — Other Ambulatory Visit: Payer: Self-pay

## 2019-08-17 ENCOUNTER — Emergency Department (HOSPITAL_COMMUNITY)
Admission: EM | Admit: 2019-08-17 | Discharge: 2019-08-17 | Disposition: A | Discharge status: Home / Self Care | Payer: PPO | Attending: Emergency Medicine | Admitting: Emergency Medicine

## 2019-08-17 DIAGNOSIS — I10 Essential (primary) hypertension: Secondary | ICD-10-CM | POA: Diagnosis not present

## 2019-08-17 DIAGNOSIS — Z79899 Other long term (current) drug therapy: Secondary | ICD-10-CM | POA: Diagnosis not present

## 2019-08-17 DIAGNOSIS — R519 Headache, unspecified: Secondary | ICD-10-CM | POA: Diagnosis not present

## 2019-08-17 DIAGNOSIS — Z7982 Long term (current) use of aspirin: Secondary | ICD-10-CM | POA: Diagnosis not present

## 2019-08-17 DIAGNOSIS — E119 Type 2 diabetes mellitus without complications: Secondary | ICD-10-CM | POA: Diagnosis not present

## 2019-08-17 DIAGNOSIS — R42 Dizziness and giddiness: Secondary | ICD-10-CM | POA: Diagnosis not present

## 2019-08-17 LAB — CBC
HCT: 44.4 % (ref 39.0–52.0)
Hemoglobin: 14.5 g/dL (ref 13.0–17.0)
MCH: 28.4 pg (ref 26.0–34.0)
MCHC: 32.7 g/dL (ref 30.0–36.0)
MCV: 87.1 fL (ref 80.0–100.0)
Platelets: 295 10*3/uL (ref 150–400)
RBC: 5.1 MIL/uL (ref 4.22–5.81)
RDW: 14.4 % (ref 11.5–15.5)
WBC: 8 10*3/uL (ref 4.0–10.5)
nRBC: 0 % (ref 0.0–0.2)

## 2019-08-17 LAB — BASIC METABOLIC PANEL
Anion gap: 14 (ref 5–15)
BUN: 14 mg/dL (ref 8–23)
CO2: 21 mmol/L — ABNORMAL LOW (ref 22–32)
Calcium: 9.5 mg/dL (ref 8.9–10.3)
Chloride: 103 mmol/L (ref 98–111)
Creatinine, Ser: 1.2 mg/dL (ref 0.61–1.24)
GFR calc Af Amer: >60 mL/min (ref >60)
GFR calc non Af Amer: >60 mL/min (ref >60)
Glucose, Bld: 314 mg/dL — ABNORMAL HIGH (ref 70–99)
Potassium: 3.5 mmol/L (ref 3.5–5.1)
Sodium: 138 mmol/L (ref 135–145)

## 2019-08-17 LAB — URINALYSIS, ROUTINE W REFLEX MICROSCOPIC
Bilirubin Urine: NEGATIVE
Glucose, UA: >=500 mg/dL — AB
Hgb urine dipstick: NEGATIVE
Ketones, ur: NEGATIVE mg/dL
Leukocytes,Ua: NEGATIVE
Nitrite: NEGATIVE
Protein, ur: NEGATIVE mg/dL
Specific Gravity, Urine: 1.025 (ref 1.005–1.030)
pH: 5 (ref 5.0–8.0)

## 2019-08-17 LAB — CBG MONITORING, ED: Glucose-Capillary: 297 mg/dL — ABNORMAL HIGH (ref 70–99)

## 2019-08-17 MED ORDER — LORAZEPAM 1 MG PO TABS
1.0000 mg | ORAL_TABLET | Freq: Once | ORAL | Status: AC
  Administered 2019-08-17: 1 mg via ORAL
  Filled 2019-08-17: qty 1

## 2019-08-17 MED ORDER — MECLIZINE HCL 12.5 MG PO TABS: 12.5000 mg | ORAL_TABLET | Freq: Every day | ORAL | 0 refills | Status: DC | PRN

## 2019-08-17 NOTE — ED Triage Notes (Addendum)
Pt reports he was taken off of his oral diabetic meds on Monday and put on insulin.  Reports nausea and dizziness that has gradually gotten worse since Monday.  Denies weakness or numbness.  No arm drift.

## 2019-08-17 NOTE — ED Notes (Signed)
Patient ambulated to restroom down hall and back to room with steady gait. Denies dizziness while ambulating.

## 2019-08-17 NOTE — ED Notes (Signed)
To mri 
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
## 2019-08-17 NOTE — ED Notes (Signed)
Patient verbalizes understanding of discharge instructions. Opportunity for questioning and answers were provided. Armband removed by staff, pt discharged from ED.  

## 2019-08-17 NOTE — ED Notes (Signed)
The pt is alert oriented skin warm and dry  No pain anywhere   Hr takes bp med and it is controlled

## 2019-08-17 NOTE — Discharge Instructions (Signed)
Please take the dizziness medication as needed once daily.  Please make sure to be careful with back pain as you may be a fall risk while you are dizzy.  Please call your endocrinologist and make an appointment for Monday to talk about insulin side effects.

## 2019-08-19 ENCOUNTER — Telehealth: Payer: Self-pay

## 2019-08-19 ENCOUNTER — Other Ambulatory Visit: Payer: Self-pay

## 2019-08-19 ENCOUNTER — Ambulatory Visit (INDEPENDENT_AMBULATORY_CARE_PROVIDER_SITE_OTHER): Payer: PPO | Admitting: Family Medicine

## 2019-08-19 ENCOUNTER — Encounter: Payer: Self-pay | Admitting: Family Medicine

## 2019-08-19 VITALS — BP 130/80 | HR 73 | Temp 98.7°F | Resp 16 | Ht 70.0 in | Wt 261.8 lb

## 2019-08-19 DIAGNOSIS — IMO0002 Reserved for concepts with insufficient information to code with codable children: Secondary | ICD-10-CM

## 2019-08-19 DIAGNOSIS — E1165 Type 2 diabetes mellitus with hyperglycemia: Secondary | ICD-10-CM | POA: Diagnosis not present

## 2019-08-19 DIAGNOSIS — E1149 Type 2 diabetes mellitus with other diabetic neurological complication: Secondary | ICD-10-CM

## 2019-08-19 MED ORDER — INSULIN NPH (HUMAN) (ISOPHANE) 100 UNIT/ML ~~LOC~~ SUSP: SUBCUTANEOUS | 11 refills | Status: DC

## 2019-08-19 NOTE — Telephone Encounter (Signed)
However, I am not seeing this medication of pt medication list

## 2019-08-19 NOTE — Telephone Encounter (Signed)
Please advise. Not seeing this medication on this pt medication list

## 2019-08-19 NOTE — Telephone Encounter (Signed)
Called pt wife and informed of all info below. Pt wife states, "well, it wouldn't matter anyways." Further added, "his PCP told him not to take, and I don't want him taking it anyways", continued to state, "I am not satisfied with his care nor control of his blood sugar.", "I am a diabetic and mines is far better controlled than his". Offered to move appt up to discuss her concerns and frustrations. Pt wife declined, stating "no, leave it.", "he'll just need to address this when he comes in"

## 2019-08-19 NOTE — Patient Instructions (Addendum)
A few things to remember from today's visit:   Type 2 diabetes mellitus with neurological manifestations, uncontrolled ([place]) - Plan: insulin NPH Human (NOVOLIN N RELION) 100 UNIT/ML injection  Today 10 units of Humulin N were added at night. Continue 35 units of Humulin N in the morning.  Diabetes Mellitus and Nutrition, Adult When you have diabetes (diabetes mellitus), it is very important to have healthy eating habits because your blood sugar (glucose) levels are greatly affected by what you eat and drink. Eating healthy foods in the appropriate amounts, at about the same times every day, can help you:  Control your blood glucose.  Lower your risk of heart disease.  Improve your blood pressure.  Reach or maintain a healthy weight. Every person with diabetes is different, and each person has different needs for a meal plan. Your health care provider may recommend that you work with a diet and nutrition specialist (dietitian) to make a meal plan that is best for you. Your meal plan may vary depending on factors such as:  The calories you need.  The medicines you take.  Your weight.  Your blood glucose, blood pressure, and cholesterol levels.  Your activity level.  Other health conditions you have, such as heart or kidney disease. How do carbohydrates affect me? Carbohydrates, also called carbs, affect your blood glucose level more than any other type of food. Eating carbs naturally raises the amount of glucose in your blood. Carb counting is a method for keeping track of how many carbs you eat. Counting carbs is important to keep your blood glucose at a healthy level, especially if you use insulin or take certain oral diabetes medicines. It is important to know how many carbs you can safely have in each meal. This is different for every person. Your dietitian can help you calculate how many carbs you should have at each meal and for each snack. Foods that contain carbs include:   Bread, cereal, rice, pasta, and crackers.  Potatoes and corn.  Peas, beans, and lentils.  Milk and yogurt.  Fruit and juice.  Desserts, such as cakes, cookies, ice cream, and candy. How does alcohol affect me? Alcohol can cause a sudden decrease in blood glucose (hypoglycemia), especially if you use insulin or take certain oral diabetes medicines. Hypoglycemia can be a life-threatening condition. Symptoms of hypoglycemia (sleepiness, dizziness, and confusion) are similar to symptoms of having too much alcohol. If your health care provider says that alcohol is safe for you, follow these guidelines:  Limit alcohol intake to no more than 1 drink per day for nonpregnant women and 2 drinks per day for men. One drink equals 12 oz of beer, 5 oz of wine, or 1 oz of hard liquor.  Do not drink on an empty stomach.  Keep yourself hydrated with water, diet soda, or unsweetened iced tea.  Keep in mind that regular soda, juice, and other mixers may contain a lot of sugar and must be counted as carbs. What are tips for following this plan?  Reading food labels  Start by checking the serving size on the "Nutrition Facts" label of packaged foods and drinks. The amount of calories, carbs, fats, and other nutrients listed on the label is based on one serving of the item. Many items contain more than one serving per package.  Check the total grams (g) of carbs in one serving. You can calculate the number of servings of carbs in one serving by dividing the total carbs by 15.  For example, if a food has 30 g of total carbs, it would be equal to 2 servings of carbs.  Check the number of grams (g) of saturated and trans fats in one serving. Choose foods that have low or no amount of these fats.  Check the number of milligrams (mg) of salt (sodium) in one serving. Most people should limit total sodium intake to less than 2,300 mg per day.  Always check the nutrition information of foods labeled as "low-fat"  or "nonfat". These foods may be higher in added sugar or refined carbs and should be avoided.  Talk to your dietitian to identify your daily goals for nutrients listed on the label. Shopping  Avoid buying canned, premade, or processed foods. These foods tend to be high in fat, sodium, and added sugar.  Shop around the outside edge of the grocery store. This includes fresh fruits and vegetables, bulk grains, fresh meats, and fresh dairy. Cooking  Use low-heat cooking methods, such as baking, instead of high-heat cooking methods like deep frying.  Cook using healthy oils, such as olive, canola, or sunflower oil.  Avoid cooking with butter, cream, or high-fat meats. Meal planning  Eat meals and snacks regularly, preferably at the same times every day. Avoid going long periods of time without eating.  Eat foods high in fiber, such as fresh fruits, vegetables, beans, and whole grains. Talk to your dietitian about how many servings of carbs you can eat at each meal.  Eat 4-6 ounces (oz) of lean protein each day, such as lean meat, chicken, fish, eggs, or tofu. One oz of lean protein is equal to: ? 1 oz of meat, chicken, or fish. ? 1 egg. ?  cup of tofu.  Eat some foods each day that contain healthy fats, such as avocado, nuts, seeds, and fish. Lifestyle  Check your blood glucose regularly.  Exercise regularly as told by your health care provider. This may include: ? 150 minutes of moderate-intensity or vigorous-intensity exercise each week. This could be brisk walking, biking, or water aerobics. ? Stretching and doing strength exercises, such as yoga or weightlifting, at least 2 times a week.  Take medicines as told by your health care provider.  Do not use any products that contain nicotine or tobacco, such as cigarettes and e-cigarettes. If you need help quitting, ask your health care provider.  Work with a Social worker or diabetes educator to identify strategies to manage stress and  any emotional and social challenges. Questions to ask a health care provider  Do I need to meet with a diabetes educator?  Do I need to meet with a dietitian?  What number can I call if I have questions?  When are the best times to check my blood glucose? Where to find more information:  American Diabetes Association: diabetes.org  Academy of Nutrition and Dietetics: www.eatright.[employer] of Diabetes and Digestive and Kidney Diseases (NIH): [employer] Summary  A healthy meal plan will help you control your blood glucose and maintain a healthy lifestyle.  Working with a diet and nutrition specialist (dietitian) can help you make a meal plan that is best for you.  Keep in mind that carbohydrates (carbs) and alcohol have immediate effects on your blood glucose levels. It is important to count carbs and to use alcohol carefully. This information is not intended to replace advice given to you by your health care provider. Make sure you discuss any questions you have with your health care provider. Document  Released: 06/30/2005 Document Revised: 09/15/2017 Document Reviewed: 11/07/2016 Elsevier Patient Education  2020 [place].  Daily Diabetes Record Check your blood glucose (BG) as directed by your health care provider. Use this form to record your BG results as well as any diabetes medicines that you take, including insulin. Bringing a record of your BG results and a list of your current medicines to your health care provider is very helpful in managing your diabetes. These numbers help your health care provider to know whether your diabetes management plan needs to be changed.    Patient name: ____________________________________ Week of ____________________ Daily BG results and diabetes medicines Date: _________  Breakfast - BG / Medicines: ________________ / __________________________________________________________  Lunch - BG / Medicines:  ___________________ / __________________________________________________________  Dinner - BG / Medicines: __________________ / __________________________________________________________  Bedtime - BG / Medicines: ________________ / ___________________________________________________________ Date: _________  Breakfast - BG / Medicines: ________________ / __________________________________________________________  Lunch - BG / Medicines: ___________________ / __________________________________________________________  Dinner - BG / Medicines: __________________ / __________________________________________________________  Bedtime - BG / Medicines: ________________ / ___________________________________________________________ Date: _________  Breakfast - BG / Medicines: ________________ / __________________________________________________________  Lunch - BG / Medicines: ___________________ / __________________________________________________________  Dinner - BG / Medicines: __________________ / __________________________________________________________  Bedtime - BG / Medicines: ________________ / ___________________________________________________________ Date: _________  Breakfast - BG / Medicines: ________________ / __________________________________________________________  Lunch - BG / Medicines: ___________________ / __________________________________________________________  Dinner - BG / Medicines: __________________ / __________________________________________________________  Bedtime - BG / Medicines: ________________ / ___________________________________________________________ Date: _________  Breakfast - BG / Medicines: ________________ / __________________________________________________________  Lunch - BG / Medicines: ___________________ / __________________________________________________________  Dinner - BG / Medicines: __________________ /  __________________________________________________________  Bedtime - BG / Medicines: ________________ / ___________________________________________________________ Date: _________  Breakfast - BG / Medicines: ________________ / __________________________________________________________  Lunch - BG / Medicines: ___________________ / __________________________________________________________  Dinner - BG / Medicines: __________________ / __________________________________________________________  Bedtime - BG / Medicines: ________________ / ___________________________________________________________ Date: _________  Breakfast - BG / Medicines: ________________ / __________________________________________________________  Lunch - BG / Medicines: ___________________ / __________________________________________________________  Dinner - BG / Medicines: __________________ / __________________________________________________________  Bedtime - BG / Medicines: ________________ / ___________________________________________________________ Notes: ______________________________________________________________________________________________________________________ This information is not intended to replace advice given to you by your health care provider. Make sure you discuss any questions you have with your health care provider. Document Released: 09/06/2004 Document Revised: 07/17/2018 Document Reviewed: 07/01/2016 Elsevier Patient Education  [place].  Please be sure medication list is accurate. If a new problem present, please set up appointment sooner than planned today.

## 2019-08-19 NOTE — Telephone Encounter (Signed)
John Hobbs patients wife called today to find out why Bromocriptine was prescribed for this patient-she would like a call back to explain this

## 2019-08-19 NOTE — Telephone Encounter (Signed)
This is an FDA-approved diabetes medication, but it was never properly marketed for diabetes (it is also used for several other other conditions).  Therefore, some health care professionals are unaware of this, so they are telling people this is a mistake, but it is not.

## 2019-08-19 NOTE — Telephone Encounter (Signed)
It appears to have been d/c'ed last week.
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
## 2019-08-28 ENCOUNTER — Ambulatory Visit: Payer: PPO | Admitting: Endocrinology

## 2019-08-28 ENCOUNTER — Ambulatory Visit: Payer: PPO | Admitting: Nutrition

## 2019-09-04 ENCOUNTER — Ambulatory Visit (INDEPENDENT_AMBULATORY_CARE_PROVIDER_SITE_OTHER): Payer: PPO

## 2019-09-04 ENCOUNTER — Ambulatory Visit (INDEPENDENT_AMBULATORY_CARE_PROVIDER_SITE_OTHER): Payer: PPO | Admitting: Family Medicine

## 2019-09-04 ENCOUNTER — Encounter: Payer: Self-pay | Admitting: Family Medicine

## 2019-09-04 ENCOUNTER — Other Ambulatory Visit: Payer: Self-pay

## 2019-09-04 VITALS — BP 142/80 | HR 87 | Temp 96.1°F | Wt 250.2 lb

## 2019-09-04 DIAGNOSIS — E1165 Type 2 diabetes mellitus with hyperglycemia: Secondary | ICD-10-CM

## 2019-09-04 DIAGNOSIS — R5383 Other fatigue: Secondary | ICD-10-CM | POA: Diagnosis not present

## 2019-09-04 DIAGNOSIS — E876 Hypokalemia: Secondary | ICD-10-CM

## 2019-09-04 DIAGNOSIS — F321 Major depressive disorder, single episode, moderate: Secondary | ICD-10-CM

## 2019-09-04 DIAGNOSIS — E1149 Type 2 diabetes mellitus with other diabetic neurological complication: Secondary | ICD-10-CM | POA: Diagnosis not present

## 2019-09-04 DIAGNOSIS — IMO0002 Reserved for concepts with insufficient information to code with codable children: Secondary | ICD-10-CM

## 2019-09-04 DIAGNOSIS — E559 Vitamin D deficiency, unspecified: Secondary | ICD-10-CM

## 2019-09-04 LAB — CBC
HCT: 42.3 % (ref 39.0–52.0)
Hemoglobin: 13.9 g/dL (ref 13.0–17.0)
MCHC: 32.8 g/dL (ref 30.0–36.0)
MCV: 86.7 fl (ref 78.0–100.0)
Platelets: 297 10*3/uL (ref 150.0–400.0)
RBC: 4.89 Mil/uL (ref 4.22–5.81)
RDW: 14.1 % (ref 11.5–15.5)
WBC: 10.7 10*3/uL — ABNORMAL HIGH (ref 4.0–10.5)

## 2019-09-04 LAB — COMPREHENSIVE METABOLIC PANEL
ALT: 16 U/L (ref 0–53)
AST: 16 U/L (ref 0–37)
Albumin: 4 g/dL (ref 3.5–5.2)
Alkaline Phosphatase: 54 U/L (ref 39–117)
BUN: 19 mg/dL (ref 6–23)
CO2: 27 mEq/L (ref 19–32)
Calcium: 9.7 mg/dL (ref 8.4–10.5)
Chloride: 98 mEq/L (ref 96–112)
Creatinine, Ser: 1.28 mg/dL (ref 0.40–1.50)
GFR: 67.05 mL/min (ref >60.00)
Glucose, Bld: 193 mg/dL — ABNORMAL HIGH (ref 70–99)
Potassium: 3 mEq/L — ABNORMAL LOW (ref 3.5–5.1)
Sodium: 138 mEq/L (ref 135–145)
Total Bilirubin: 0.9 mg/dL (ref 0.2–1.2)
Total Protein: 7.4 g/dL (ref 6.0–8.3)

## 2019-09-04 LAB — VITAMIN D 25 HYDROXY (VIT D DEFICIENCY, FRACTURES): VITD: 28.72 ng/mL — ABNORMAL LOW (ref 30.00–100.00)

## 2019-09-04 LAB — TSH: TSH: 2.64 u[IU]/mL (ref 0.35–4.50)

## 2019-09-04 LAB — T4, FREE: Free T4: 0.92 ng/dL (ref 0.60–1.60)

## 2019-09-04 MED ORDER — POTASSIUM CHLORIDE CRYS ER 20 MEQ PO TBCR: 40.0000 meq | EXTENDED_RELEASE_TABLET | Freq: Two times a day (BID) | ORAL | 0 refills | Status: DC

## 2019-09-04 MED ORDER — VITAMIN D (ERGOCALCIFEROL) 1.25 MG (50000 UNIT) PO CAPS: 50000.0000 [IU] | ORAL_CAPSULE | ORAL | 0 refills | Status: DC
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
## 2019-09-04 NOTE — Patient Instructions (Signed)
Diabetes Mellitus and Sick Day Management Blood sugar (glucose) can be difficult to control when you are sick. Common illnesses that can cause problems for people with diabetes (diabetes mellitus) include colds, fever, flu (influenza), nausea, vomiting, and diarrhea. These illnesses can cause stress and loss of body fluids (dehydration), and those issues can cause blood glucose levels to increase. Because of this, it is very important to take your insulin and diabetes medicines and eat some form of carbohydrate when you are sick. You should make a plan for days when you are sick (sick day plan) as part of your diabetes management plan. You and your health care provider should make this plan in advance. The following guidelines are intended to help you manage an illness that lasts for about 24 hours or less. Your health care provider may also give you more specific instructions. What do I need to do to manage my blood glucose?   Check your blood glucose every 2-4 hours, or as often as told by your health care provider.  Know your sick day treatment goals. Your target blood glucose levels may be different when you are sick.  If you use insulin, take your usual dose. ? If your blood glucose continues to be too high, you may need to take an additional insulin dose as told by your health care provider.  If you use oral diabetes medicine, you may need to stop taking it if you are not able to eat or drink normally. Ask your health care provider about whether you need to stop taking these medicines while you are sick.  If you use injectable hormone medicines other than insulin to control your diabetes, ask your health care provider about whether you need to stop taking these medicines while you are sick. What else can I do to manage my diabetes when I am sick? Check your ketones  If you have type 1 diabetes, check your urine ketones every 4 hours.  If you have type 2 diabetes, check your urine ketones  as often as told by your health care provider. Drink fluids  Drink enough fluid to keep your urine clear or pale yellow. This is especially important if you have a fever, vomiting, or diarrhea. Those symptoms can lead to dehydration.  Follow any instructions from your health care provider about beverages to avoid. ? Do not drink alcohol, caffeine, or drinks that contain a lot of sugar. Take medicines as directed  Take-over-the-counter and prescription medicines only as told by your health care provider.  Check medicine labels for added sugars. Some medicines may contain sugar or types of sugars that can raise your blood glucose level. What foods can I eat when I am sick?  You need to eat some form of carbohydrates when you are sick. You should eat 45-50 grams (45-50 g) of carbohydrates every 3-4 hours until you feel better. All of the food choices below contain about 15 g of carbohydrates. Plan ahead and keep some of these foods around so you have them if you get sick.  4-6 oz (120-177 mL) carbonated beverage that contains sugar, such as regular (not diet) soda. You may be able to drink carbonated beverages more easily if you open the beverage and let it sit at room temperature for a few minutes before drinking.   of a twin frozen ice pop.  4 oz (120 g) regular gelatin.  4 oz (120 mL) fruit juice.  4 oz (120 g) ice cream or frozen yogurt.  2  oz (60 g) sherbet.  8 oz (240 mL) clear broth or soup.  4 oz (120 g) regular custard.  4 oz (120 g) regular pudding.  8 oz (240 g) plain yogurt.  1 slice bread or toast.  6 saltine crackers.  5 vanilla wafers. Questions to ask your health care provider Consider asking the following questions so you know what to do on days when you are sick:  Should I adjust my diabetes medicines?  How often do I need to check my blood glucose?  What supplies do I need to manage my diabetes at home when I am sick?  What number can I call if I  have questions?  What foods and drinks should I avoid? Contact a health care provider if:  You develop symptoms of diabetic ketoacidosis, such as: ? Fatigue. ? Weight loss. ? Excessive thirst. ? Light-headedness. ? Fruity or sweet-smelling breath. ? Excessive urination. ? Vision changes. ? Confusion or irritability. ? Nausea. ? Vomiting. ? Rapid breathing. ? Pain in the abdomen. ? Feeling flushed.  You are unable to drink fluids without vomiting.  You have any of the following for more than 6 hours: ? Nausea. ? Vomiting. ? Diarrhea.  Your blood glucose is at or above 240 mg/dL (13.3 mmol/L), even after you take an additional insulin dose.  You have a change in how you think, feel, or act (mental status).  You develop another serious illness.  You have been sick or have had a fever for 2 days or longer and you are not getting better. Get help right away if:  Your blood glucose is lower than 54 mg/dL (3.0 mmol/L).  You have difficulty breathing.  You have moderate or high ketone levels in your urine.  You used emergency glucagon to treat low blood glucose. Summary  Blood sugar (glucose) can be difficult to control when you are sick. Common illnesses that can cause problems for people with diabetes (diabetes mellitus) include colds, fever, flu (influenza), nausea, vomiting, and diarrhea.  Illnesses can cause stress and loss of body fluids (dehydration), and those issues can cause blood glucose levels to increase.  Make a plan for days when you are sick (sick day plan) as part of your diabetes management plan. You and your health care provider should make this plan in advance.  It is very important to take your insulin and diabetes medicines and to eat some form of carbohydrate when you are sick.  Contact your health care provider if have problems managing your blood glucose levels when you are sick, or if you have been sick or had a fever for 2 days or longer and are  not getting better. This information is not intended to replace advice given to you by your health care provider. Make sure you discuss any questions you have with your health care provider. Document Released: 10/06/2003 Document Revised: 07/01/2016 Document Reviewed: 07/01/2016 Elsevier Patient Education  [place].  Fatigue If you have fatigue, you feel tired all the time and have a lack of energy or a lack of motivation. Fatigue may make it difficult to start or complete tasks because of exhaustion. In general, occasional or mild fatigue is often a normal response to activity or life. However, long-lasting (chronic) or extreme fatigue may be a symptom of a medical condition. Follow these instructions at home: General instructions  Watch your fatigue for any changes.  Go to bed and get up at the same time every day.  Avoid fatigue  by pacing yourself during the day and getting enough sleep at night.  Maintain a healthy weight. Medicines  Take over-the-counter and prescription medicines only as told by your health care provider.  Take a multivitamin, if told by your health care provider.  Do not use herbal or dietary supplements unless they are approved by your health care provider. Activity   Exercise regularly, as told by your health care provider.  Use or practice techniques to help you relax, such as yoga, tai chi, meditation, or massage therapy. Eating and drinking   Avoid heavy meals in the evening.  Eat a well-balanced diet, which includes lean proteins, whole grains, plenty of fruits and vegetables, and low-fat dairy products.  Avoid consuming too much caffeine.  Avoid the use of alcohol.  Drink enough fluid to keep your urine pale yellow. Lifestyle  Change situations that cause you stress. Try to keep your work and personal schedule in balance.  Do not use any products that contain nicotine or tobacco, such as cigarettes and e-cigarettes. If you need  help quitting, ask your health care provider.  Do not use drugs. Contact a health care provider if:  Your fatigue does not get better.  You have a fever.  You suddenly lose or gain weight.  You have headaches.  You have trouble falling asleep or sleeping through the night.  You feel angry, guilty, anxious, or sad.  You are unable to have a bowel movement (constipation).  Your skin is dry.  You have swelling in your legs or another part of your body. Get help right away if:  You feel confused.  Your vision is blurry.  You feel faint or you pass out.  You have a severe headache.  You have severe pain in your abdomen, your back, or the area between your waist and hips (pelvis).  You have chest pain, shortness of breath, or an irregular or fast heartbeat.  You are unable to urinate, or you urinate less than normal.  You have abnormal bleeding, such as bleeding from the rectum, vagina, nose, lungs, or nipples.  You vomit blood.  You have thoughts about hurting yourself or others. If you ever feel like you may hurt yourself or others, or have thoughts about taking your own life, get help right away. You can go to your nearest emergency department or call:  Your local emergency services (911 in the [place]  A suicide crisis helpline, such as the [facility] at (517)500-3375. This is open 24 hours a day. Summary  If you have fatigue, you feel tired all the time and have a lack of energy or a lack of motivation.  Fatigue may make it difficult to start or complete tasks because of exhaustion.  Long-lasting (chronic) or extreme fatigue may be a symptom of a medical condition.  Exercise regularly, as told by your health care provider.  Change situations that cause you stress. Try to keep your work and personal schedule in balance. This information is not intended to replace advice given to you by your health care provider. Make sure you discuss  any questions you have with your health care provider. Document Released: 07/31/2007 Document Revised: 01/24/2019 Document Reviewed: 06/28/2017 Elsevier Patient Education  2020 [employer].

## 2019-09-05 ENCOUNTER — Telehealth: Payer: Self-pay | Admitting: Family Medicine

## 2019-09-05 LAB — PROTEIN / CREATININE RATIO, URINE
Creatinine, Urine: 143 mg/dL (ref 20–320)
Protein/Creat Ratio: 91 mg/g creat (ref 22–128)
Protein/Creatinine Ratio: 0.091 mg/mg creat (ref 0.022–0.12)
Total Protein, Urine: 13 mg/dL (ref 5–25)

## 2019-09-05 LAB — EXTRA URINE SPECIMEN

## 2019-09-05 MED ORDER — POTASSIUM CHLORIDE CRYS ER 20 MEQ PO TBCR: 40.0000 meq | EXTENDED_RELEASE_TABLET | Freq: Two times a day (BID) | ORAL | 0 refills | Status: DC

## 2019-09-05 NOTE — Telephone Encounter (Signed)
I called the pts wife and she stated the pharmacist would not give her the Rx for potassium from yesterday as this was written for 3 tablets only and he was told to take this every day?  Message sent to Dr Volanda Napoleon.

## 2019-09-05 NOTE — Telephone Encounter (Signed)
Patient wife called back in reference to Rx for potassium chloride SA (KLOR-CON) 20 MEQ tablet  Per wife the pharmacy refused to fill Rx since it was written for 3 tabs and they states that its wrong asking for a corrected Rx today please. And call wife to inform Ph# [phone]

## 2019-09-05 NOTE — Telephone Encounter (Signed)
Patient wife called and would like to speak with Dr. Volanda Napoleon or her CMA about a medication that was called into pt pharmacy and it was called in wrong per pharmacy. She nor the patient remember the name of the medication. Please call wife back, thanks.

## 2019-09-05 NOTE — Telephone Encounter (Signed)
Rx resent.

## 2019-09-11 ENCOUNTER — Other Ambulatory Visit: Payer: Self-pay

## 2019-09-17 ENCOUNTER — Other Ambulatory Visit: Payer: Self-pay

## 2019-09-18 ENCOUNTER — Encounter: Payer: Self-pay | Admitting: Family Medicine

## 2019-09-18 ENCOUNTER — Ambulatory Visit (INDEPENDENT_AMBULATORY_CARE_PROVIDER_SITE_OTHER): Payer: PPO | Admitting: Family Medicine

## 2019-09-18 VITALS — BP 96/64 | HR 72 | Temp 97.6°F | Wt 246.0 lb

## 2019-09-18 DIAGNOSIS — I10 Essential (primary) hypertension: Secondary | ICD-10-CM | POA: Diagnosis not present

## 2019-09-18 DIAGNOSIS — E1165 Type 2 diabetes mellitus with hyperglycemia: Secondary | ICD-10-CM | POA: Diagnosis not present

## 2019-09-18 DIAGNOSIS — IMO0002 Reserved for concepts with insufficient information to code with codable children: Secondary | ICD-10-CM

## 2019-09-18 DIAGNOSIS — E876 Hypokalemia: Secondary | ICD-10-CM | POA: Diagnosis not present

## 2019-09-18 DIAGNOSIS — E1149 Type 2 diabetes mellitus with other diabetic neurological complication: Secondary | ICD-10-CM

## 2019-09-18 LAB — BASIC METABOLIC PANEL
BUN: 16 mg/dL (ref 6–23)
CO2: 28 mEq/L (ref 19–32)
Calcium: 10.5 mg/dL (ref 8.4–10.5)
Chloride: 103 mEq/L (ref 96–112)
Creatinine, Ser: 1.14 mg/dL (ref 0.40–1.50)
GFR: 76.63 mL/min (ref >60.00)
Glucose, Bld: 158 mg/dL — ABNORMAL HIGH (ref 70–99)
Potassium: 3.6 mEq/L (ref 3.5–5.1)
Sodium: 140 mEq/L (ref 135–145)

## 2019-09-18 LAB — BRAIN NATRIURETIC PEPTIDE: Pro B Natriuretic peptide (BNP): 26 pg/mL (ref 0.0–100.0)

## 2019-09-18 MED ORDER — POTASSIUM CHLORIDE CRYS ER 20 MEQ PO TBCR: 10.0000 meq | EXTENDED_RELEASE_TABLET | Freq: Every day | ORAL | 3 refills | Status: DC

## 2019-09-18 MED ORDER — GLUCOSE BLOOD VI STRP: ORAL_STRIP | 12 refills | Status: DC

## 2019-09-18 MED ORDER — LOSARTAN POTASSIUM 50 MG PO TABS: 50.0000 mg | ORAL_TABLET | Freq: Every day | ORAL | 3 refills | Status: DC

## 2019-09-18 NOTE — Patient Instructions (Addendum)
Please start checking your blood pressure daily and keep a log of your readings.  We discussed decreasing your losartan from 100 mg to 50 mg daily.  This means you can take half of the 100 mg tabs that you have at home.  I have also sent a new prescription to your pharmacy for the decreased dose.  Continue current medications for diabetes including the Metformin 1000 mg in the morning.  You should continue checking your blood sugar regularly.  You should also make changes to your diet to help improve your blood sugar numbers.  A prescription for new test strips was sent to your pharmacy. Preventing Diabetes Mellitus Complications You can take action to prevent or slow down problems that are caused by diabetes (diabetes mellitus). Following your diabetes plan and taking care of yourself can reduce your risk of serious or life-threatening complications. What actions can I take to prevent diabetes complications? Manage your diabetes   Follow instructions from your health care providers about managing your diabetes. Your diabetes may be managed by a team of health care providers who can teach you how to care for yourself and can answer questions that you have.  Educate yourself about your condition so you can make healthy choices about eating and physical activity.  Check your blood sugar (glucose) levels as often as directed. Your health care provider will help you decide how often to check your blood glucose level depending on your treatment goals and how well you are meeting them.  Ask your health care provider if you should take low-dose aspirin daily and what dose is recommended for you. Taking low-dose aspirin daily is recommended to help prevent cardiovascular disease. Do not use nicotine or tobacco Do not use any products that contain nicotine or tobacco, such as cigarettes and e-cigarettes. If you need help quitting, ask your health care provider. Nicotine raises your risk for diabetes  problems. If you quit using nicotine:  You will lower your risk for heart attack, stroke, nerve disease, and kidney disease.  Your cholesterol and blood pressure may improve.  Your blood circulation will improve. Keep your blood pressure under control Your personal target blood pressure is determined based on:  Your age.  Your medicines.  How long you have had diabetes.  Any other medical conditions you have. To control your blood pressure:  Follow instructions from your health care provider about meal planning, exercise, and medicines.  Make sure your health care provider checks your blood pressure at every medical visit.  Monitor your blood pressure at home as told by your health care provider.  Keep your cholesterol under control To control your cholesterol:  Follow instructions from your health care provider about meal planning, exercise, and medicines.  Have your cholesterol checked at least once a year.  You may be prescribed medicine to lower cholesterol (statin). If you are not taking a statin, ask your health care provider if you should be. Controlling your cholesterol may:  Help prevent heart disease and stroke. These are the most common health problems for people with diabetes.  Improve your blood flow. Schedule and keep yearly physical exams and eye exams Your health care provider will tell you how often you need medical visits depending on your diabetes management plan. Keep all follow-up visits as directed. This is important so possible problems can be identified early and complications can be avoided or treated.  Every visit with your health care provider should include measuring your: ? Weight. ? Blood pressure. ?  Blood glucose control.  Your A1c (hemoglobin A1c) level should be checked: ? At least 2 times a year, if you are meeting your treatment goals. ? 4 times a year, if you are not meeting treatment goals or if your treatment goals have  changed.  Your blood lipids (lipid profile) should be checked yearly. You should also be checked yearly for protein in your urine (urine microalbumin).  If you have type 1 diabetes, get an eye exam 3-5 years after you are diagnosed, and then once a year after your first exam.  If you have type 2 diabetes, get an eye exam as soon as you are diagnosed, and then once a year after your first exam. Keep your vaccines current It is recommended that you receive:  A flu (influenza) vaccine every year.  A pneumonia (pneumococcal) vaccine and a hepatitis B vaccine. If you are age 48 or older, you may get the pneumonia vaccine as a series of two separate shots. Ask your health care provider which other vaccines may be recommended. Take care of your feet Diabetes may cause you to have poor blood circulation to your legs and feet. Because of this, taking care of your feet is very important. Diabetes can cause:  The skin on the feet to get thinner, break more easily, and heal more slowly.  Nerve damage in your legs and feet, which results in decreased feeling. You may not notice minor injuries that could lead to serious problems. To avoid foot problems:  Check your skin and feet every day for cuts, bruises, redness, blisters, or sores.  Schedule a foot exam with your health care provider once every year. This exam includes: ? Inspecting of the structure and skin of your feet. ? Checking the pulses and sensation in your feet.  Make sure that your health care provider performs a visual foot exam at every medical visit.  Take care of your teeth People with poorly controlled diabetes are more likely to have gum (periodontal) disease. Diabetes can make periodontal diseases harder to control. If not treated, periodontal diseases can lead to tooth loss. To prevent this:  Brush your teeth twice a day.  Floss at least once a day.  Visit your dentist 2 times a year. Drink responsibly Limit alcohol  intake to no more than 1 drink a day for nonpregnant women and 2 drinks a day for men. One drink equals 12 oz of beer, 5 oz of wine, or 1 oz of hard liquor.  It is important to eat food when you drink alcohol to avoid low blood glucose (hypoglycemia). Avoid alcohol if you:  Have a history of alcohol abuse or dependence.  Are pregnant.  Have liver disease, pancreatitis, advanced neuropathy, or severe hypertriglyceridemia. Lessen stress Living with diabetes can be stressful. When you are experiencing stress, your blood glucose may be affected in two ways:  Stress hormones may cause your blood glucose to rise.  You may be distracted from taking good care of yourself. Be aware of your stress level and make changes to help you manage challenging situations. To lower your stress levels:  Consider joining a support group.  Do planned relaxation or meditation.  Do a hobby that you enjoy.  Maintain healthy relationships.  Exercise regularly.  Work with your health care provider or a mental health professional. Summary  You can take action to prevent or slow down problems that are caused by diabetes (diabetes mellitus). Following your diabetes plan and taking care of yourself can  reduce your risk of serious or life-threatening complications.  Follow instructions from your health care providers about managing your diabetes. Your diabetes may be managed by a team of health care providers who can teach you how to care for yourself and can answer questions that you have.  Your health care provider will tell you how often you need medical visits depending on your diabetes management plan. Keep all follow-up visits as directed. This is important so possible problems can be identified early and complications can be avoided or treated. This information is not intended to replace advice given to you by your health care provider. Make sure you discuss any questions you have with your health care  provider. Document Released: 06/21/2011 Document Revised: 01/01/2018 Document Reviewed: 07/02/2016 Elsevier Patient Education  [employer] [place].  Diabetes Mellitus and Sick Day Management Blood sugar (glucose) can be difficult to control when you are sick. Common illnesses that can cause problems for people with diabetes (diabetes mellitus) include colds, fever, flu (influenza), nausea, vomiting, and diarrhea. These illnesses can cause stress and loss of body fluids (dehydration), and those issues can cause blood glucose levels to increase. Because of this, it is very important to take your insulin and diabetes medicines and eat some form of carbohydrate when you are sick. You should make a plan for days when you are sick (sick day plan) as part of your diabetes management plan. You and your health care provider should make this plan in advance. The following guidelines are intended to help you manage an illness that lasts for about 24 hours or less. Your health care provider may also give you more specific instructions. What do I need to do to manage my blood glucose?   Check your blood glucose every 2-4 hours, or as often as told by your health care provider.  Know your sick day treatment goals. Your target blood glucose levels may be different when you are sick.  If you use insulin, take your usual dose. ? If your blood glucose continues to be too high, you may need to take an additional insulin dose as told by your health care provider.  If you use oral diabetes medicine, you may need to stop taking it if you are not able to eat or drink normally. Ask your health care provider about whether you need to stop taking these medicines while you are sick.  If you use injectable hormone medicines other than insulin to control your diabetes, ask your health care provider about whether you need to stop taking these medicines while you are sick. What else can I do to manage my diabetes when I am  sick? Check your ketones  If you have type 1 diabetes, check your urine ketones every 4 hours.  If you have type 2 diabetes, check your urine ketones as often as told by your health care provider. Drink fluids  Drink enough fluid to keep your urine clear or pale yellow. This is especially important if you have a fever, vomiting, or diarrhea. Those symptoms can lead to dehydration.  Follow any instructions from your health care provider about beverages to avoid. ? Do not drink alcohol, caffeine, or drinks that contain a lot of sugar. Take medicines as directed  Take-over-the-counter and prescription medicines only as told by your health care provider.  Check medicine labels for added sugars. Some medicines may contain sugar or types of sugars that can raise your blood glucose level. What foods can I eat when I  am sick?  You need to eat some form of carbohydrates when you are sick. You should eat 45-50 grams (45-50 g) of carbohydrates every 3-4 hours until you feel better. All of the food choices below contain about 15 g of carbohydrates. Plan ahead and keep some of these foods around so you have them if you get sick.  4-6 oz (120-177 mL) carbonated beverage that contains sugar, such as regular (not diet) soda. You may be able to drink carbonated beverages more easily if you open the beverage and let it sit at room temperature for a few minutes before drinking.   of a twin frozen ice pop.  4 oz (120 g) regular gelatin.  4 oz (120 mL) fruit juice.  4 oz (120 g) ice cream or frozen yogurt.  2 oz (60 g) sherbet.  8 oz (240 mL) clear broth or soup.  4 oz (120 g) regular custard.  4 oz (120 g) regular pudding.  8 oz (240 g) plain yogurt.  1 slice bread or toast.  6 saltine crackers.  5 vanilla wafers. Questions to ask your health care provider Consider asking the following questions so you know what to do on days when you are sick:  Should I adjust my diabetes  medicines?  How often do I need to check my blood glucose?  What supplies do I need to manage my diabetes at home when I am sick?  What number can I call if I have questions?  What foods and drinks should I avoid? Contact a health care provider if:  You develop symptoms of diabetic ketoacidosis, such as: ? Fatigue. ? Weight loss. ? Excessive thirst. ? Light-headedness. ? Fruity or sweet-smelling breath. ? Excessive urination. ? Vision changes. ? Confusion or irritability. ? Nausea. ? Vomiting. ? Rapid breathing. ? Pain in the abdomen. ? Feeling flushed.  You are unable to drink fluids without vomiting.  You have any of the following for more than 6 hours: ? Nausea. ? Vomiting. ? Diarrhea.  Your blood glucose is at or above 240 mg/dL (13.3 mmol/L), even after you take an additional insulin dose.  You have a change in how you think, feel, or act (mental status).  You develop another serious illness.  You have been sick or have had a fever for 2 days or longer and you are not getting better. Get help right away if:  Your blood glucose is lower than 54 mg/dL (3.0 mmol/L).  You have difficulty breathing.  You have moderate or high ketone levels in your urine.  You used emergency glucagon to treat low blood glucose. Summary  Blood sugar (glucose) can be difficult to control when you are sick. Common illnesses that can cause problems for people with diabetes (diabetes mellitus) include colds, fever, flu (influenza), nausea, vomiting, and diarrhea.  Illnesses can cause stress and loss of body fluids (dehydration), and those issues can cause blood glucose levels to increase.  Make a plan for days when you are sick (sick day plan) as part of your diabetes management plan. You and your health care provider should make this plan in advance.  It is very important to take your insulin and diabetes medicines and to eat some form of carbohydrate when you are sick.  Contact  your health care provider if have problems managing your blood glucose levels when you are sick, or if you have been sick or had a fever for 2 days or longer and are not getting better. This information  is not intended to replace advice given to you by your health care provider. Make sure you discuss any questions you have with your health care provider. Document Released: 10/06/2003 Document Revised: 07/01/2016 Document Reviewed: 07/01/2016 Elsevier Patient Education  2020 [employer].  Managing Your Hypertension Hypertension is commonly called high blood pressure. This is when the force of your blood pressing against the walls of your arteries is too strong. Arteries are blood vessels that carry blood from your heart throughout your body. Hypertension forces the heart to work harder to pump blood, and may cause the arteries to become narrow or stiff. Having untreated or uncontrolled hypertension can cause heart attack, stroke, kidney disease, and other problems. What are blood pressure readings? A blood pressure reading consists of a higher number over a lower number. Ideally, your blood pressure should be below 120/80. The first ("top") number is called the systolic pressure. It is a measure of the pressure in your arteries as your heart beats. The second ("bottom") number is called the diastolic pressure. It is a measure of the pressure in your arteries as the heart relaxes. What does my blood pressure reading mean? Blood pressure is classified into four stages. Based on your blood pressure reading, your health care provider may use the following stages to determine what type of treatment you need, if any. Systolic pressure and diastolic pressure are measured in a unit called mm Hg. Normal  Systolic pressure: below 683.  Diastolic pressure: below 80. Elevated  Systolic pressure: 419-622.  Diastolic pressure: below 80. Hypertension stage 1  Systolic pressure: 297-989.  Diastolic  pressure: 21-19. Hypertension stage 2  Systolic pressure: 417 or above.  Diastolic pressure: 90 or above. What health risks are associated with hypertension? Managing your hypertension is an important responsibility. Uncontrolled hypertension can lead to:  A heart attack.  A stroke.  A weakened blood vessel (aneurysm).  Heart failure.  Kidney damage.  Eye damage.  Metabolic syndrome.  Memory and concentration problems. What changes can I make to manage my hypertension? Hypertension can be managed by making lifestyle changes and possibly by taking medicines. Your health care provider will help you make a plan to bring your blood pressure within a normal range. Eating and drinking   Eat a diet that is high in fiber and potassium, and low in salt (sodium), added sugar, and fat. An example eating plan is called the DASH (Dietary Approaches to Stop Hypertension) diet. To eat this way: ? Eat plenty of fresh fruits and vegetables. Try to fill half of your plate at each meal with fruits and vegetables. ? Eat whole grains, such as whole wheat pasta, brown rice, or whole grain bread. Fill about one quarter of your plate with whole grains. ? Eat low-fat diary products. ? Avoid fatty cuts of meat, processed or cured meats, and poultry with skin. Fill about one quarter of your plate with lean proteins such as fish, chicken without skin, beans, eggs, and tofu. ? Avoid premade and processed foods. These tend to be higher in sodium, added sugar, and fat.  Reduce your daily sodium intake. Most people with hypertension should eat less than 1,500 mg of sodium a day.  Limit alcohol intake to no more than 1 drink a day for nonpregnant women and 2 drinks a day for men. One drink equals 12 oz of beer, 5 oz of wine, or 1 oz of hard liquor. Lifestyle  Work with your health care provider to maintain a healthy body weight,  or to lose weight. Ask what an ideal weight is for you.  Get at least 30  minutes of exercise that causes your heart to beat faster (aerobic exercise) most days of the week. Activities may include walking, swimming, or biking.  Include exercise to strengthen your muscles (resistance exercise), such as weight lifting, as part of your weekly exercise routine. Try to do these types of exercises for 30 minutes at least 3 days a week.  Do not use any products that contain nicotine or tobacco, such as cigarettes and e-cigarettes. If you need help quitting, ask your health care provider.  Control any long-term (chronic) conditions you have, such as high cholesterol or diabetes. Monitoring  Monitor your blood pressure at home as told by your health care provider. Your personal target blood pressure may vary depending on your medical conditions, your age, and other factors.  Have your blood pressure checked regularly, as often as told by your health care provider. Working with your health care provider  Review all the medicines you take with your health care provider because there may be side effects or interactions.  Talk with your health care provider about your diet, exercise habits, and other lifestyle factors that may be contributing to hypertension.  Visit your health care provider regularly. Your health care provider can help you create and adjust your plan for managing hypertension. Will I need medicine to control my blood pressure? Your health care provider may prescribe medicine if lifestyle changes are not enough to get your blood pressure under control, and if:  Your systolic blood pressure is 130 or higher.  Your diastolic blood pressure is 80 or higher. Take medicines only as told by your health care provider. Follow the directions carefully. Blood pressure medicines must be taken as prescribed. The medicine does not work as well when you skip doses. Skipping doses also puts you at risk for problems. Contact a health care provider if:  You think you are  having a reaction to medicines you have taken.  You have repeated (recurrent) headaches.  You feel dizzy.  You have swelling in your ankles.  You have trouble with your vision. Get help right away if:  You develop a severe headache or confusion.  You have unusual weakness or numbness, or you feel faint.  You have severe pain in your chest or abdomen.  You vomit repeatedly.  You have trouble breathing. Summary  Hypertension is when the force of blood pumping through your arteries is too strong. If this condition is not controlled, it may put you at risk for serious complications.  Your personal target blood pressure may vary depending on your medical conditions, your age, and other factors. For most people, a normal blood pressure is less than 120/80.  Hypertension is managed by lifestyle changes, medicines, or both. Lifestyle changes include weight loss, eating a healthy, low-sodium diet, exercising more, and limiting alcohol. This information is not intended to replace advice given to you by your health care provider. Make sure you discuss any questions you have with your health care provider. Document Released: 06/27/2012 Document Revised: 01/25/2019 Document Reviewed: 08/31/2016 Elsevier Patient Education  2020 [employer].
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
## 2019-10-04 ENCOUNTER — Other Ambulatory Visit: Payer: Self-pay

## 2019-10-08 ENCOUNTER — Other Ambulatory Visit: Payer: Self-pay

## 2019-10-08 ENCOUNTER — Ambulatory Visit (INDEPENDENT_AMBULATORY_CARE_PROVIDER_SITE_OTHER): Payer: PPO | Admitting: Internal Medicine

## 2019-10-08 ENCOUNTER — Encounter: Payer: Self-pay | Admitting: Internal Medicine

## 2019-10-08 VITALS — BP 122/82 | HR 96 | Temp 98.1°F | Ht 70.0 in | Wt 247.6 lb

## 2019-10-08 DIAGNOSIS — E1165 Type 2 diabetes mellitus with hyperglycemia: Secondary | ICD-10-CM

## 2019-10-08 MED ORDER — REPAGLINIDE 2 MG PO TABS: ORAL_TABLET | ORAL | 6 refills | Status: DC
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
## 2019-10-08 NOTE — Patient Instructions (Addendum)
-   Continue Metformin 1000 mg with breakfast  - Continue Januvia 100 mg with breakfast  - Continue Invokana 300 mg daily with Breakfast  - Change repaglinide (Prandin) TWO tablets before Breakfast and Lunch and ONE tablet before supper     - Try and check sugar before each meal    - Minimize snacks when possible      HOW TO TREAT LOW BLOOD SUGARS (Blood sugar LESS THAN 70 MG/DL)  Please follow the RULE OF 15 for the treatment of hypoglycemia treatment (when your (blood sugars are less than 70 mg/dL)    STEP 1: Take 15 grams of carbohydrates when your blood sugar is low, which includes:   3-4 GLUCOSE TABS  OR  3-4 OZ OF JUICE OR REGULAR SODA OR  ONE TUBE OF GLUCOSE GEL     STEP 2: RECHECK blood sugar in 15 MINUTES STEP 3: If your blood sugar is still low at the 15 minute recheck --> then, go back to STEP 1 and treat AGAIN with another 15 grams of carbohydrates.

## 2019-10-10 DIAGNOSIS — E1165 Type 2 diabetes mellitus with hyperglycemia: Secondary | ICD-10-CM | POA: Insufficient documentation

## 2019-10-17 ENCOUNTER — Ambulatory Visit: Payer: PPO | Attending: Internal Medicine

## 2019-10-17 ENCOUNTER — Other Ambulatory Visit: Payer: PPO

## 2019-10-17 DIAGNOSIS — Z20828 Contact with and (suspected) exposure to other viral communicable diseases: Secondary | ICD-10-CM | POA: Diagnosis not present

## 2019-10-17 DIAGNOSIS — Z20822 Contact with and (suspected) exposure to covid-19: Secondary | ICD-10-CM

## 2019-10-19 LAB — NOVEL CORONAVIRUS, NAA: SARS-CoV-2, NAA: NOT DETECTED

## 2019-10-24 ENCOUNTER — Other Ambulatory Visit: Payer: Self-pay

## 2019-10-24 ENCOUNTER — Encounter: Payer: Self-pay | Admitting: Family Medicine

## 2019-10-24 ENCOUNTER — Ambulatory Visit (INDEPENDENT_AMBULATORY_CARE_PROVIDER_SITE_OTHER): Payer: PPO | Admitting: Family Medicine

## 2019-10-24 VITALS — BP 110/78 | HR 78 | Temp 97.8°F | Wt 252.0 lb

## 2019-10-24 DIAGNOSIS — I10 Essential (primary) hypertension: Secondary | ICD-10-CM

## 2019-10-24 DIAGNOSIS — E1165 Type 2 diabetes mellitus with hyperglycemia: Secondary | ICD-10-CM
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
## 2019-11-02 ENCOUNTER — Other Ambulatory Visit: Payer: Self-pay | Admitting: Family Medicine

## 2019-11-12 DIAGNOSIS — H5203 Hypermetropia, bilateral: Secondary | ICD-10-CM | POA: Diagnosis not present

## 2019-11-12 DIAGNOSIS — H25813 Combined forms of age-related cataract, bilateral: Secondary | ICD-10-CM | POA: Diagnosis not present

## 2019-11-12 DIAGNOSIS — H524 Presbyopia: Secondary | ICD-10-CM | POA: Diagnosis not present

## 2019-11-12 DIAGNOSIS — H40013 Open angle with borderline findings, low risk, bilateral: Secondary | ICD-10-CM | POA: Diagnosis not present

## 2019-11-12 DIAGNOSIS — E119 Type 2 diabetes mellitus without complications: Secondary | ICD-10-CM | POA: Diagnosis not present

## 2019-11-12 DIAGNOSIS — H52203 Unspecified astigmatism, bilateral: Secondary | ICD-10-CM | POA: Diagnosis not present

## 2020-01-02 ENCOUNTER — Other Ambulatory Visit: Payer: Self-pay

## 2020-01-06 ENCOUNTER — Other Ambulatory Visit: Payer: Self-pay

## 2020-01-06 ENCOUNTER — Encounter: Payer: Self-pay | Admitting: Internal Medicine

## 2020-01-06 ENCOUNTER — Ambulatory Visit (INDEPENDENT_AMBULATORY_CARE_PROVIDER_SITE_OTHER): Payer: PPO | Admitting: Internal Medicine

## 2020-01-06 VITALS — BP 116/70 | HR 72 | Temp 98.7°F | Ht 70.0 in | Wt 248.2 lb

## 2020-01-06 DIAGNOSIS — E1149 Type 2 diabetes mellitus with other diabetic neurological complication: Secondary | ICD-10-CM | POA: Diagnosis not present

## 2020-01-06 DIAGNOSIS — E1165 Type 2 diabetes mellitus with hyperglycemia: Secondary | ICD-10-CM

## 2020-01-06 DIAGNOSIS — IMO0002 Reserved for concepts with insufficient information to code with codable children: Secondary | ICD-10-CM

## 2020-01-06 LAB — POCT GLYCOSYLATED HEMOGLOBIN (HGB A1C): Hemoglobin A1C: 6.5 % — AB (ref 4.0–5.6)

## 2020-01-06 LAB — GLUCOSE, POCT (MANUAL RESULT ENTRY): POC Glucose: 248 mg/dl — AB (ref 70–99)

## 2020-01-06 MED ORDER — REPAGLINIDE 2 MG PO TABS: ORAL_TABLET | ORAL | 6 refills | Status: DC

## 2020-01-06 MED ORDER — SITAGLIPTIN PHOSPHATE 100 MG PO TABS: 100.0000 mg | ORAL_TABLET | Freq: Every day | ORAL | 3 refills | Status: DC

## 2020-01-06 MED ORDER — METFORMIN HCL 1000 MG PO TABS: 1000.0000 mg | ORAL_TABLET | Freq: Two times a day (BID) | ORAL | 3 refills | Status: DC

## 2020-01-06 MED ORDER — CANAGLIFLOZIN 300 MG PO TABS: 300.0000 mg | ORAL_TABLET | Freq: Every day | ORAL | 3 refills | Status: DC

## 2020-01-06 NOTE — Patient Instructions (Signed)
-   Continue Metformin 1000 mg TWICE daily  - Continue Januvia 100 mg with breakfast  - Continue Invokana 300 mg daily with Breakfast  - Change repaglinide (Prandin) TWO tablets before Breakfast and Lunch and HALF a  tablet before supper     - Try and check sugar before each meal - Bring meter on next visit     - Minimize snacks when possible      HOW TO TREAT LOW BLOOD SUGARS (Blood sugar LESS THAN 70 MG/DL)  Please follow the RULE OF 15 for the treatment of hypoglycemia treatment (when your (blood sugars are less than 70 mg/dL)    STEP 1: Take 15 grams of carbohydrates when your blood sugar is low, which includes:   3-4 GLUCOSE TABS  OR  3-4 OZ OF JUICE OR REGULAR SODA OR  ONE TUBE OF GLUCOSE GEL     STEP 2: RECHECK blood sugar in 15 MINUTES STEP 3: If your blood sugar is still low at the 15 minute recheck --> then, go back to STEP 1 and treat AGAIN with another 15 grams of carbohydrates.
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
## 2020-01-08 ENCOUNTER — Other Ambulatory Visit: Payer: Self-pay | Admitting: Family Medicine

## 2020-01-08 DIAGNOSIS — I10 Essential (primary) hypertension: Secondary | ICD-10-CM

## 2020-01-22 ENCOUNTER — Other Ambulatory Visit: Payer: Self-pay

## 2020-01-22 ENCOUNTER — Ambulatory Visit (INDEPENDENT_AMBULATORY_CARE_PROVIDER_SITE_OTHER): Payer: PPO | Admitting: Family Medicine

## 2020-01-22 ENCOUNTER — Encounter: Payer: Self-pay | Admitting: Family Medicine

## 2020-01-22 VITALS — BP 102/68 | HR 68 | Temp 97.9°F | Wt 245.0 lb

## 2020-01-22 DIAGNOSIS — I10 Essential (primary) hypertension: Secondary | ICD-10-CM

## 2020-01-22 DIAGNOSIS — E1165 Type 2 diabetes mellitus with hyperglycemia: Secondary | ICD-10-CM | POA: Diagnosis not present

## 2020-01-22 MED ORDER — ONETOUCH VERIO VI STRP: 1.0000 | ORAL_STRIP | Freq: Every day | 3 refills | Status: DC

## 2020-01-22 MED ORDER — ONETOUCH ULTRASOFT LANCETS MISC: 5 refills | Status: AC
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
## 2020-01-22 NOTE — Patient Instructions (Signed)
Diabetes Mellitus and Standards of Medical Care Managing diabetes (diabetes mellitus) can be complicated. Your diabetes treatment may be managed by a team of health care providers, including:  A physician who specializes in diabetes (endocrinologist).  A nurse practitioner or physician assistant.  Nurses.  A diet and nutrition specialist (registered dietitian).  A certified diabetes educator (CDE).  An exercise specialist.  A pharmacist.  An eye doctor.  A foot specialist (podiatrist).  A dentist.  A primary care provider.  A mental health provider. Your health care providers follow guidelines to help you get the best quality of care. The following schedule is a general guideline for your diabetes management plan. Your health care providers may give you more specific instructions. Physical exams Upon being diagnosed with diabetes mellitus, and each year after that, your health care provider will ask about your medical and family history. He or she will also do a physical exam. Your exam may include:  Measuring your height, weight, and body mass index (BMI).  Checking your blood pressure. This will be done at every routine medical visit. Your target blood pressure may vary depending on your medical conditions, your age, and other factors.  Thyroid gland exam.  Skin exam.  Screening for damage to your nerves (peripheral neuropathy). This may include checking the pulse in your legs and feet and checking the level of sensation in your hands and feet.  A complete foot exam to inspect the structure and skin of your feet, including checking for cuts, bruises, redness, blisters, sores, or other problems.  Screening for blood vessel (vascular) problems, which may include checking the pulse in your legs and feet and checking your temperature. Blood tests Depending on your treatment plan and your personal needs, you may have the following tests done:  HbA1c (hemoglobin A1c). This  test provides information about blood sugar (glucose) control over the previous 2-3 months. It is used to adjust your treatment plan, if needed. This test will be done: ? At least 2 times a year, if you are meeting your treatment goals. ? 4 times a year, if you are not meeting your treatment goals or if treatment goals have changed.  Lipid testing, including total, LDL, and HDL cholesterol and triglyceride levels. ? The goal for LDL is less than 100 mg/dL (5.5 mmol/L). If you are at high risk for complications, the goal is less than 70 mg/dL (3.9 mmol/L). ? The goal for HDL is 40 mg/dL (2.2 mmol/L) or higher for men and 50 mg/dL (2.8 mmol/L) or higher for women. An HDL cholesterol of 60 mg/dL (3.3 mmol/L) or higher gives some protection against heart disease. ? The goal for triglycerides is less than 150 mg/dL (8.3 mmol/L).  Liver function tests.  Kidney function tests.  Thyroid function tests. Dental and eye exams  Visit your dentist two times a year.  If you have type 1 diabetes, your health care provider may recommend an eye exam 3-5 years after you are diagnosed, and then once a year after your first exam. ? For children with type 1 diabetes, a health care provider may recommend an eye exam when your child is age 10 or older and has had diabetes for 3-5 years. After the first exam, your child should get an eye exam once a year.  If you have type 2 diabetes, your health care provider may recommend an eye exam as soon as you are diagnosed, and then once a year after your first exam. Immunizations   The  yearly flu (influenza) vaccine is recommended for everyone 6 months or older who has diabetes.  The pneumonia (pneumococcal) vaccine is recommended for everyone 2 years or older who has diabetes. If you are 72 or older, you may get the pneumonia vaccine as a series of two separate shots.  The hepatitis B vaccine is recommended for adults shortly after being diagnosed with  diabetes.  Adults and children with diabetes should receive all other vaccines according to age-specific recommendations from the Centers for Disease Control and Prevention (CDC). Mental and emotional health Screening for symptoms of eating disorders, anxiety, and depression is recommended at the time of diagnosis and afterward as needed. If your screening shows that you have symptoms (positive screening result), you may need more evaluation and you may work with a mental health care provider. Treatment plan Your treatment plan will be reviewed at every medical visit. You and your health care provider will discuss:  How you are taking your medicines, including insulin.  Any side effects you are experiencing.  Your blood glucose target goals.  The frequency of your blood glucose monitoring.  Lifestyle habits, such as activity level as well as tobacco, alcohol, and substance use. Diabetes self-management education Your health care provider will assess how well you are monitoring your blood glucose levels and whether you are taking your insulin correctly. He or she may refer you to:  A certified diabetes educator to manage your diabetes throughout your life, starting at diagnosis.  A registered dietitian who can create or review your personal nutrition plan.  An exercise specialist who can discuss your activity level and exercise plan. Summary  Managing diabetes (diabetes mellitus) can be complicated. Your diabetes treatment may be managed by a team of health care providers.  Your health care providers follow guidelines in order to help you get the best quality of care.  Standards of care including having regular physical exams, blood tests, blood pressure monitoring, immunizations, screening tests, and education about how to manage your diabetes.  Your health care providers may also give you more specific instructions based on your individual health. This information is not intended  to replace advice given to you by your health care provider. Make sure you discuss any questions you have with your health care provider. Document Revised: 06/22/2018 Document Reviewed: 07/01/2016 Elsevier Patient Education  [place].  Diabetes Mellitus and Nutrition, Adult When you have diabetes (diabetes mellitus), it is very important to have healthy eating habits because your blood sugar (glucose) levels are greatly affected by what you eat and drink. Eating healthy foods in the appropriate amounts, at about the same times every day, can help you:  Control your blood glucose.  Lower your risk of heart disease.  Improve your blood pressure.  Reach or maintain a healthy weight. Every person with diabetes is different, and each person has different needs for a meal plan. Your health care provider may recommend that you work with a diet and nutrition specialist (dietitian) to make a meal plan that is best for you. Your meal plan may vary depending on factors such as:  The calories you need.  The medicines you take.  Your weight.  Your blood glucose, blood pressure, and cholesterol levels.  Your activity level.  Other health conditions you have, such as heart or kidney disease. How do carbohydrates affect me? Carbohydrates, also called carbs, affect your blood glucose level more than any other type of food. Eating carbs naturally raises the amount of glucose  in your blood. Carb counting is a method for keeping track of how many carbs you eat. Counting carbs is important to keep your blood glucose at a healthy level, especially if you use insulin or take certain oral diabetes medicines. It is important to know how many carbs you can safely have in each meal. This is different for every person. Your dietitian can help you calculate how many carbs you should have at each meal and for each snack. Foods that contain carbs include:  Bread, cereal, rice, pasta, and  crackers.  Potatoes and corn.  Peas, beans, and lentils.  Milk and yogurt.  Fruit and juice.  Desserts, such as cakes, cookies, ice cream, and candy. How does alcohol affect me? Alcohol can cause a sudden decrease in blood glucose (hypoglycemia), especially if you use insulin or take certain oral diabetes medicines. Hypoglycemia can be a life-threatening condition. Symptoms of hypoglycemia (sleepiness, dizziness, and confusion) are similar to symptoms of having too much alcohol. If your health care provider says that alcohol is safe for you, follow these guidelines:  Limit alcohol intake to no more than 1 drink per day for nonpregnant women and 2 drinks per day for men. One drink equals 12 oz of beer, 5 oz of wine, or 1 oz of hard liquor.  Do not drink on an empty stomach.  Keep yourself hydrated with water, diet soda, or unsweetened iced tea.  Keep in mind that regular soda, juice, and other mixers may contain a lot of sugar and must be counted as carbs. What are tips for following this plan?  Reading food labels  Start by checking the serving size on the "Nutrition Facts" label of packaged foods and drinks. The amount of calories, carbs, fats, and other nutrients listed on the label is based on one serving of the item. Many items contain more than one serving per package.  Check the total grams (g) of carbs in one serving. You can calculate the number of servings of carbs in one serving by dividing the total carbs by 15. For example, if a food has 30 g of total carbs, it would be equal to 2 servings of carbs.  Check the number of grams (g) of saturated and trans fats in one serving. Choose foods that have low or no amount of these fats.  Check the number of milligrams (mg) of salt (sodium) in one serving. Most people should limit total sodium intake to less than 2,300 mg per day.  Always check the nutrition information of foods labeled as "low-fat" or "nonfat". These foods may be  higher in added sugar or refined carbs and should be avoided.  Talk to your dietitian to identify your daily goals for nutrients listed on the label. Shopping  Avoid buying canned, premade, or processed foods. These foods tend to be high in fat, sodium, and added sugar.  Shop around the outside edge of the grocery store. This includes fresh fruits and vegetables, bulk grains, fresh meats, and fresh dairy. Cooking  Use low-heat cooking methods, such as baking, instead of high-heat cooking methods like deep frying.  Cook using healthy oils, such as olive, canola, or sunflower oil.  Avoid cooking with butter, cream, or high-fat meats. Meal planning  Eat meals and snacks regularly, preferably at the same times every day. Avoid going long periods of time without eating.  Eat foods high in fiber, such as fresh fruits, vegetables, beans, and whole grains. Talk to your dietitian about how many servings  of carbs you can eat at each meal.  Eat 4-6 ounces (oz) of lean protein each day, such as lean meat, chicken, fish, eggs, or tofu. One oz of lean protein is equal to: ? 1 oz of meat, chicken, or fish. ? 1 egg. ?  cup of tofu.  Eat some foods each day that contain healthy fats, such as avocado, nuts, seeds, and fish. Lifestyle  Check your blood glucose regularly.  Exercise regularly as told by your health care provider. This may include: ? 150 minutes of moderate-intensity or vigorous-intensity exercise each week. This could be brisk walking, biking, or water aerobics. ? Stretching and doing strength exercises, such as yoga or weightlifting, at least 2 times a week.  Take medicines as told by your health care provider.  Do not use any products that contain nicotine or tobacco, such as cigarettes and e-cigarettes. If you need help quitting, ask your health care provider.  Work with a Social worker or diabetes educator to identify strategies to manage stress and any emotional and social  challenges. Questions to ask a health care provider  Do I need to meet with a diabetes educator?  Do I need to meet with a dietitian?  What number can I call if I have questions?  When are the best times to check my blood glucose? Where to find more information:  American Diabetes Association: diabetes.org  Academy of Nutrition and Dietetics: www.eatright.[employer] of Diabetes and Digestive and Kidney Diseases (NIH): [employer] Summary  A healthy meal plan will help you control your blood glucose and maintain a healthy lifestyle.  Working with a diet and nutrition specialist (dietitian) can help you make a meal plan that is best for you.  Keep in mind that carbohydrates (carbs) and alcohol have immediate effects on your blood glucose levels. It is important to count carbs and to use alcohol carefully. This information is not intended to replace advice given to you by your health care provider. Make sure you discuss any questions you have with your health care provider. Document Revised: 09/15/2017 Document Reviewed: 11/07/2016 Elsevier Patient Education  2020 [place].  Diabetes Mellitus and Exercise Exercising regularly is important for your overall health, especially when you have diabetes (diabetes mellitus). Exercising is not only about losing weight. It has many other health benefits, such as increasing muscle strength and bone density and reducing body fat and stress. This leads to improved fitness, flexibility, and endurance, all of which result in better overall health. Exercise has additional benefits for people with diabetes, including:  Reducing appetite.  Helping to lower and control blood glucose.  Lowering blood pressure.  Helping to control amounts of fatty substances (lipids) in the blood, such as cholesterol and triglycerides.  Helping the body to respond better to insulin (improving insulin sensitivity).  Reducing how much insulin  the body needs.  Decreasing the risk for heart disease by: ? Lowering cholesterol and triglyceride levels. ? Increasing the levels of good cholesterol. ? Lowering blood glucose levels. What is my activity plan? Your health care provider or certified diabetes educator can help you make a plan for the type and frequency of exercise (activity plan) that works for you. Make sure that you:  Do at least 150 minutes of moderate-intensity or vigorous-intensity exercise each week. This could be brisk walking, biking, or water aerobics. ? Do stretching and strength exercises, such as yoga or weightlifting, at least 2 times a week. ? Spread out your activity over at least  3 days of the week.  Get some form of physical activity every day. ? Do not go more than 2 days in a row without some kind of physical activity. ? Avoid being inactive for more than 30 minutes at a time. Take frequent breaks to walk or stretch.  Choose a type of exercise or activity that you enjoy, and set realistic goals.  Start slowly, and gradually increase the intensity of your exercise over time. What do I need to know about managing my diabetes?   Check your blood glucose before and after exercising. ? If your blood glucose is 240 mg/dL (13.3 mmol/L) or higher before you exercise, check your urine for ketones. If you have ketones in your urine, do not exercise until your blood glucose returns to normal. ? If your blood glucose is 100 mg/dL (5.6 mmol/L) or lower, eat a snack containing 15-20 grams of carbohydrate. Check your blood glucose 15 minutes after the snack to make sure that your level is above 100 mg/dL (5.6 mmol/L) before you start your exercise.  Know the symptoms of low blood glucose (hypoglycemia) and how to treat it. Your risk for hypoglycemia increases during and after exercise. Common symptoms of hypoglycemia can include: ? Hunger. ? Anxiety. ? Sweating and feeling clammy. ? Confusion. ? Dizziness or  feeling light-headed. ? Increased heart rate or palpitations. ? Blurry vision. ? Tingling or numbness around the mouth, lips, or tongue. ? Tremors or shakes. ? Irritability.  Keep a rapid-acting carbohydrate snack available before, during, and after exercise to help prevent or treat hypoglycemia.  Avoid injecting insulin into areas of the body that are going to be exercised. For example, avoid injecting insulin into: ? The arms, when playing tennis. ? The legs, when jogging.  Keep records of your exercise habits. Doing this can help you and your health care provider adjust your diabetes management plan as needed. Write down: ? Food that you eat before and after you exercise. ? Blood glucose levels before and after you exercise. ? The type and amount of exercise you have done. ? When your insulin is expected to peak, if you use insulin. Avoid exercising at times when your insulin is peaking.  When you start a new exercise or activity, work with your health care provider to make sure the activity is safe for you, and to adjust your insulin, medicines, or food intake as needed.  Drink plenty of water while you exercise to prevent dehydration or heat stroke. Drink enough fluid to keep your urine clear or pale yellow. Summary  Exercising regularly is important for your overall health, especially when you have diabetes (diabetes mellitus).  Exercising has many health benefits, such as increasing muscle strength and bone density and reducing body fat and stress.  Your health care provider or certified diabetes educator can help you make a plan for the type and frequency of exercise (activity plan) that works for you.  When you start a new exercise or activity, work with your health care provider to make sure the activity is safe for you, and to adjust your insulin, medicines, or food intake as needed. This information is not intended to replace advice given to you by your health care  provider. Make sure you discuss any questions you have with your health care provider. Document Revised: 04/27/2017 Document Reviewed: 03/14/2016 Elsevier Patient Education  [place].

## 2020-01-23 ENCOUNTER — Other Ambulatory Visit: Payer: Self-pay | Admitting: Family Medicine

## 2020-01-31 ENCOUNTER — Other Ambulatory Visit: Payer: Self-pay | Admitting: Family Medicine

## 2020-01-31 DIAGNOSIS — E1165 Type 2 diabetes mellitus with hyperglycemia: Secondary | ICD-10-CM

## 2020-01-31 DIAGNOSIS — I10 Essential (primary) hypertension: Secondary | ICD-10-CM

## 2020-02-02 IMAGING — MR MR SHOULDER*L* W/O CM
4 series · 40 of 40 positions shown · non-contrast
Comparison: Radiographs 02/12/2018.

CLINICAL DATA: 71-year-old with shoulder pain and limited range of
motion for 3-4 months. No known injury or prior relevant surgery.

EXAM:
MRI OF THE LEFT SHOULDER WITHOUT CONTRAST
TECHNIQUE: Multiplanar, multisequence MR imaging of the shoulder was performed.
No intravenous contrast was administered.

[Series 3: T2 fat-sat · axial · 4.0mm · 0.62mm/px · z∈[-39,+57]mm · 13 of 21 slices shown (1 of 3)]
[im 1/21]
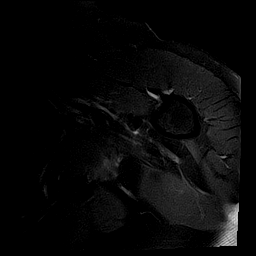
[im 2/21]
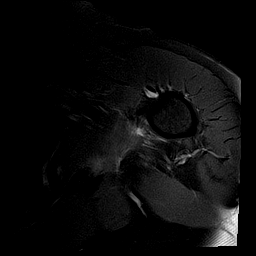
[im 4/21]
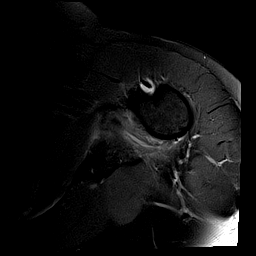
[im 6/21]
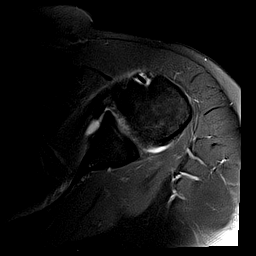
[im 7/21]
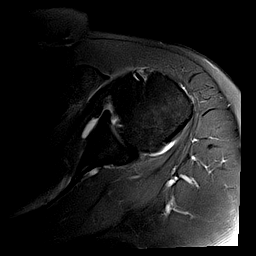
[im 9/21]
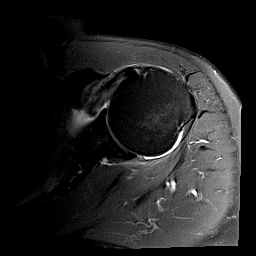
[im 11/21]
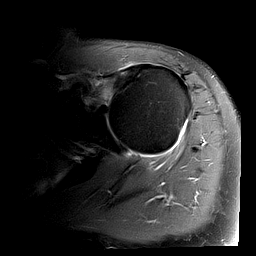
[im 12/21]
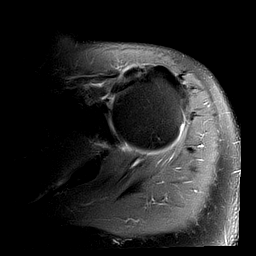
[im 14/21]
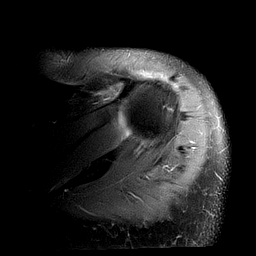
[im 16/21]
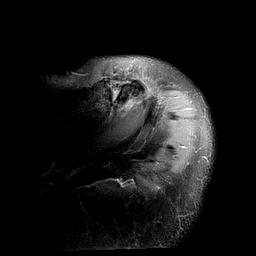
[im 17/21]
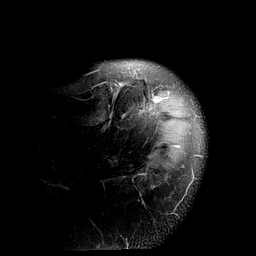
[im 19/21]
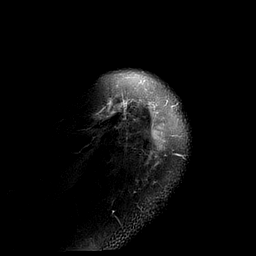
[im 21/21]
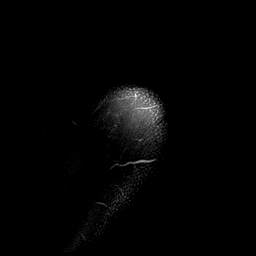

[Series 4: T2 fat-sat · sagittal · 4.0mm · 0.55mm/px · 9 of 17 slices shown (2 of 3)]
[im 1/17]
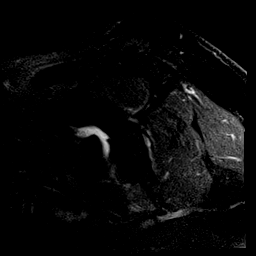
[im 3/17]
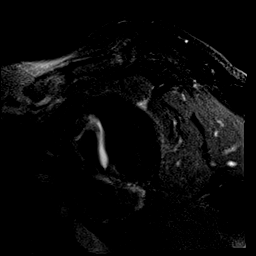
[im 5/17]
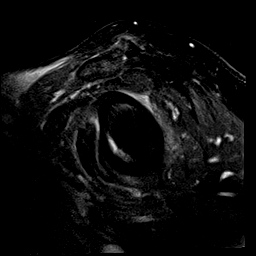
[im 7/17]
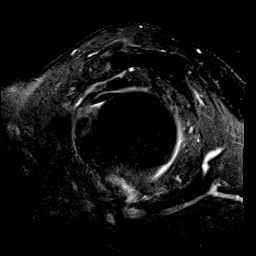
[im 9/17]
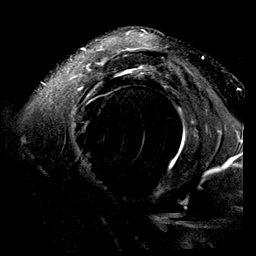
[im 11/17]
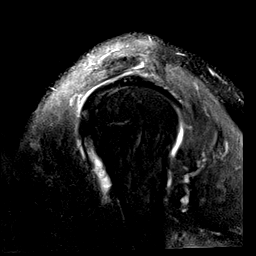
[im 13/17]
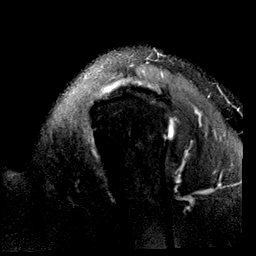
[im 15/17]
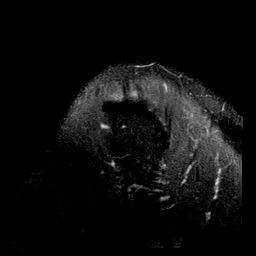
[im 17/17]
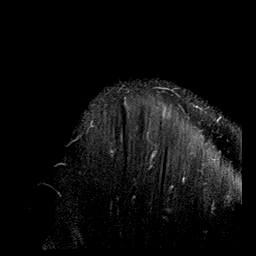

[Series 5: T1 · sagittal · 4.0mm · 0.55mm/px · 9 of 17 slices shown]
[im 1/17]
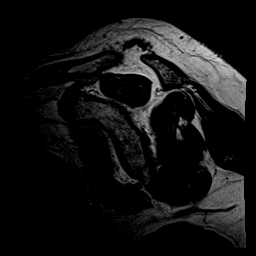
[im 3/17]
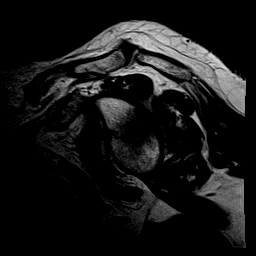
[im 5/17]
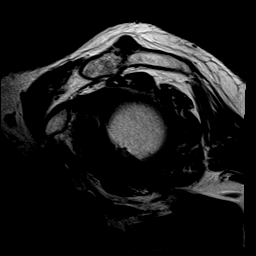
[im 7/17]
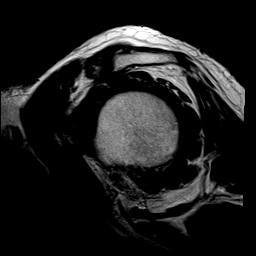
[im 9/17]
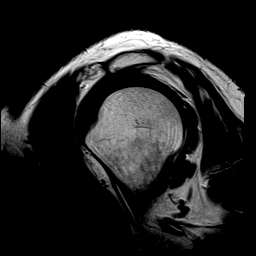
[im 11/17]
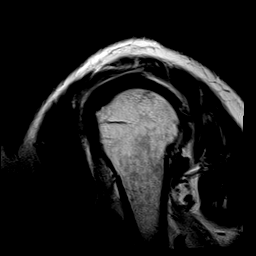
[im 13/17]
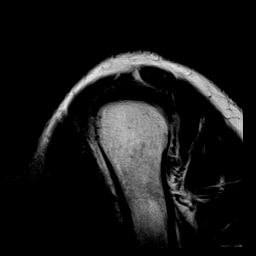
[im 15/17]
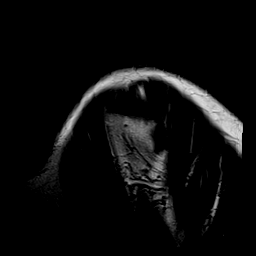
[im 17/17]
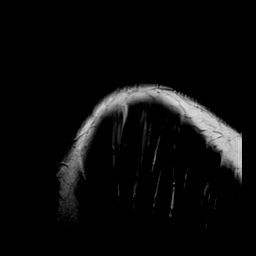

[Series 6: T2 fat-sat · oblique · 4.0mm · 0.55mm/px · 9 of 17 slices shown (3 of 3)]
[im 1/17]
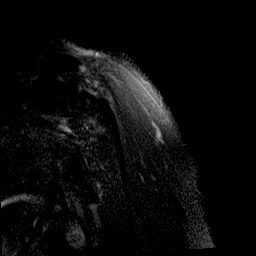
[im 3/17]
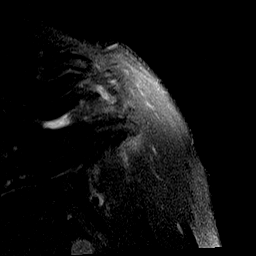
[im 5/17]
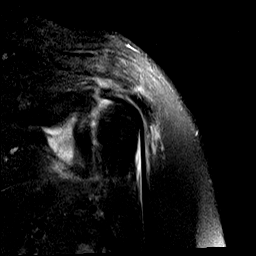
[im 7/17]
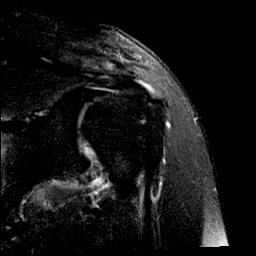
[im 9/17]
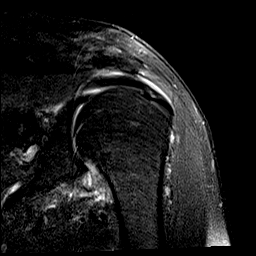
[im 11/17]
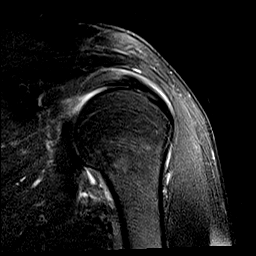
[im 13/17]
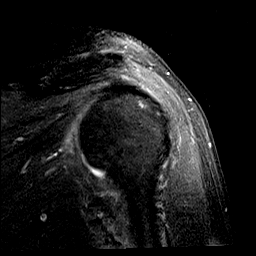
[im 15/17]
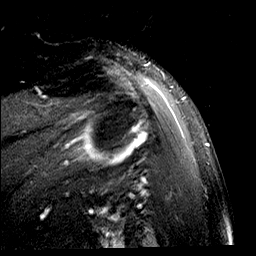
[im 17/17]
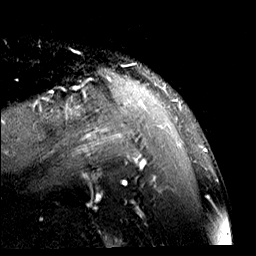

[40 of 40 positions shown; findings below may reference images not displayed]

FINDINGS: The patient was not able to complete the examination due to pain.
The coronal proton density images were not obtained. Despite efforts
by the technologist and patient, mild motion artifact is present on
today's exam and could not be eliminated. This reduces exam
sensitivity and specificity.

Rotator cuff: There is no evidence of full-thickness rotator cuff
tear. There is subscapularis tendinosis. There is mild sentinel cyst
formation near the musculotendinous junction of the infraspinatus,
best seen on the sagittal images. The supraspinatus tendon
demonstrates no definite abnormality.

Muscles:  No focal muscular atrophy or edema.

Biceps long head:  Intact and normally positioned.

Acromioclavicular Joint: The acromion is type [DATE]. There are
mild-to-moderate acromioclavicular degenerative changes. A small
amount of fluid is present in the subacromial-subdeltoid bursa.

Glenohumeral Joint: No significant shoulder joint effusion or
glenohumeral arthropathy. Best seen on the coronal images is
prominent joint capsular edema in the axillary recess. There appears
to be mild edema in the rotator interval as well.

Labrum: Labral assessment limited by the motion and lack of joint
fluid. No evidence of labral tear or paralabral cyst.

Bones: No acute or significant extra-articular osseous findings.

Other: No significant soft tissue findings.
IMPRESSION: 1. Study is mildly motion degraded, and the patient was not able to
complete the examination.
2. Joint capsular edema in the axillary recess, suggesting adhesive
capsulitis.
3. Mild rotator cuff tendinosis with areas of sentinel cyst
formation in the infraspinatus, implying partial tearing. No
evidence of full-thickness rotator cuff tear.
4. The labrum and biceps tendon appear intact.
5. Mild-to-moderate acromioclavicular degenerative changes.

## 2020-02-04 ENCOUNTER — Telehealth: Payer: Self-pay | Admitting: Family Medicine

## 2020-02-04 NOTE — Chronic Care Management (AMB) (Signed)
  Chronic Care Management   Note  02/04/2020 Name: GOMER FRANCE MRN: [id number] DOB: 03-30-1946  MALEAK BRAZZEL is a 74 y.o. year old male who is a primary care patient of Billie Ruddy, MD. I reached out to Sherrian Divers by phone today in response to a referral sent by Mr. Nichola Warren Ingram's PCP, Billie Ruddy, MD.   Mr. Junious was given information about Chronic Care Management services today including:  1. CCM service includes personalized support from designated clinical staff supervised by his physician, including individualized plan of care and coordination with other care providers 2. 24/7 contact phone numbers for assistance for urgent and routine care needs. 3. Service will only be billed when office clinical staff spend 20 minutes or more in a month to coordinate care. 4. Only one practitioner may furnish and bill the service in a calendar month. 5. The patient may stop CCM services at any time (effective at the end of the month) by phone call to the office staff.   Patient agreed to services and verbal consent obtained.   Follow up plan:   Raynicia Dukes [employer] Scheduler

## 2020-02-10 ENCOUNTER — Telehealth: Payer: PPO

## 2020-02-11 ENCOUNTER — Other Ambulatory Visit: Payer: Self-pay

## 2020-02-11 ENCOUNTER — Ambulatory Visit: Payer: PPO

## 2020-02-11 DIAGNOSIS — I10 Essential (primary) hypertension: Secondary | ICD-10-CM

## 2020-02-11 DIAGNOSIS — E1149 Type 2 diabetes mellitus with other diabetic neurological complication: Secondary | ICD-10-CM

## 2020-02-11 DIAGNOSIS — E785 Hyperlipidemia, unspecified: Secondary | ICD-10-CM

## 2020-02-11 DIAGNOSIS — E1169 Type 2 diabetes mellitus with other specified complication: Secondary | ICD-10-CM

## 2020-02-11 DIAGNOSIS — IMO0002 Reserved for concepts with insufficient information to code with codable children: Secondary | ICD-10-CM

## 2020-02-11 NOTE — Chronic Care Management (AMB) (Signed)
Chronic Care Management Pharmacy  Name: John Hobbs  MRN: [id number] DOB: 06-Sep-1946  Initial Questions: 1. Have you seen any other providers since your last visit? NA 2. Any changes in your medicines or health? No   Chief Complaint/ HPI  John Hobbs,  74 y.o. , male presents for their Initial CCM visit with the clinical pharmacist via telephone due to COVID-19 Pandemic.  PCP : Billie Ruddy, MD  Their chronic conditions include: HTN, DM, HLD, vitamin D deficiency, allergies   Office Visits: 01/22/2020- Grier Mitts, MD- Patient presented for office visit for follow up. CM and HTN well controlled. Last A1c: 6.9 (01/06/2020) and office BP: 102/32mHg. Patient noted being in the donut hole. Patient offered patient assistance programs for medications. Patient to follow up with endocrinology. Patient to follow up in 3 to 4 months.   10/24/2019- SGrier Mitts MD- Patient presented for office visit for follow up. Patient to continue follow-up with endocrinology. HTN and DM controlled. Patient to continue current medications.   Consult Visit: 01/06/2020- Endocrinology- ICloyd Stagers MD- patient presented for office visit for DM follow up. Due to concern of recurrent hypoglycemia at night, repaglinide dose with supper was decreased. Patient to follow up in 4 months.  Medications: Outpatient Encounter Medications as of 02/11/2020  Medication Sig  . amLODipine (NORVASC) 5 MG tablet TAKE 1 TABLET BY MOUTH DAILY (Patient taking differently: Take 5 mg by mouth daily after breakfast. )  . aspirin EC 81 MG tablet Take 81 mg by mouth daily after breakfast.  . atenolol-chlorthalidone (TENORETIC) 100-25 MG tablet TAKE 1/2 TABLET BY MOUTH DAILY  . atorvastatin (LIPITOR) 20 MG tablet TAKE 1 TABLET BY MOUTH EVERY DAY  . glucose blood (ONETOUCH VERIO) test strip 1 each by Other route daily. Use to check blood sugar once daily  . glucose blood test strip Use as instructed for  checking fsbs TID.  .Marland KitchenLancets (ONETOUCH ULTRASOFT) lancets Use to check blood sugar once daily  . losartan (COZAAR) 50 MG tablet Take 1 tablet (50 mg total) by mouth daily.  . metFORMIN (GLUCOPHAGE) 1000 MG tablet Take 1 tablet (1,000 mg total) by mouth 2 (two) times daily with a meal.  . ONE TOUCH LANCETS MISC Test once day  . potassium chloride SA (KLOR-CON) 20 MEQ tablet Take 0.5 tablets (10 mEq total) by mouth daily.  . repaglinide (PRANDIN) 2 MG tablet Take 2 tablets (4 mg total) by mouth daily before breakfast AND 2 tablets (4 mg total) daily before lunch AND 0.5 tablets (1 mg total) daily before supper. Take 2 tablets by Mouth three times daily before meals . (Patient taking differently: Take 2 tablets (4 mg total) by mouth daily before breakfast AND 2 tablets (4 mg total) daily before lunch AND 1 tablet (2 mg total) daily before supper. Tak)  . sitaGLIPtin (JANUVIA) 100 MG tablet Take 1 tablet (100 mg total) by mouth daily.  .Marland Kitchentriamcinolone (NASACORT) 55 MCG/ACT AERO nasal inhaler Place 2 sprays into the nose daily as needed (congestion).  . canagliflozin (INVOKANA) 300 MG TABS tablet Take 1 tablet (300 mg total) by mouth daily before breakfast. (Patient not taking: Reported on 02/11/2020)  . loratadine (CLARITIN) 10 MG tablet TAKE 1 TABLET(10 MG) BY MOUTH DAILY (Patient not taking: Reported on 02/11/2020)  . Vitamin D, Ergocalciferol, (DRISDOL) 1.25 MG (50000 UT) CAPS capsule Take 1 capsule (50,000 Units total) by mouth every 7 (seven) days. (Patient not taking: Reported on 02/11/2020)   No facility-administered  encounter medications on file as of 02/11/2020.     Current Diagnosis/Assessment:  Goals Addressed   None     Diabetes   Patient reports being out of Invokana for 3 weeks.   Recent Relevant Labs: Lab Results  Component Value Date/Time   HGBA1C 6.5 (A) 01/06/2020 10:25 AM   HGBA1C 8.2 (A) 08/12/2019 08:04 AM   HGBA1C 8.3 (H) 01/30/2018 08:11 AM   HGBA1C 7.5 (H)  05/04/2017 10:11 AM   MICROALBUR 0.7 12/22/2015 09:02 AM   MICROALBUR 0.5 08/20/2014 09:41 AM    Checking BG: 3x per Day.   Recent FBG Readings: 136m/dl  Recent 2hr PP BG readings:  unable to provide Recent HS BG readings: unable to provide  Patient has failed these meds in past: glipizide, Novolin N, pioglitazone  Patient is currently controlled on the following medications:   Invokana 3074m 1 tablet once daily before breakfast  Metformin 100062m1 tablet twice daily with a meal  repaglinide 2mg74m tablets before breakfast, 2 tablets before lunch, and 1 tablet before supper   Januvia 100mg70mtablet once daily   Last diabetic Eye exam:  Lab Results  Component Value Date/Time   HMDIABEYEEXA No Retinopathy 07/19/2017 12:00 AM    Last diabetic Foot exam: No results found for: HMDIABFOOTEX   We discussed: diet and exercise extensively . Preventative diabetic health exams . Patient notes walking 1 hour every day and eating better.   Plan Managed by IbtehMelanie Crazierleffer (endo).  Patient to still continue with patient assistance program until he follows up with endocrinolgist. Patient wants to focus on lifestyle modifications to remain staying off InvokRosaliantinue current medications and control with diet and exercise  Hypertension  Patient denied dizziness/ lightheadedness.   BP today is:  <130/80  Office blood pressures are  BP Readings from Last 3 Encounters:  01/22/20 102/68  01/06/20 116/70  10/24/19 110/78   Patient has failed these meds in the past: none  Patient checks BP at home daily  Patient home BP readings are ranging: unable to report  Patient is controlled on:  Amlodipine 5mg, 79mablet once daily  Atenolol-chlorthalidone 100-25mg, 15m(one-half) tablet once daily  Losartan 50mg, 166mlet once daily   We discussed diet and exercise extensively . DASH diet:  following a diet emphasizing fruits and vegetables and low-fat dairy products  along with whole grains, fish, poultry, and nuts. Reducing red meats and sugars.  . Exercising  Plan Continue current medications and control with diet and exercise   Hyperlipidemia  Patient reported has not been taking since bottle was not in open view.   Lipid Panel     Component Value Date/Time   CHOL 106 01/30/2018 0811   TRIG 80.0 01/30/2018 0811   HDL 37.80 (L) 01/30/2018 0811   CHOLHDL 3 01/30/2018 0811   VLDL 16.0 01/30/2018 0811   LDLCALC 52 01/30/2018 0811    The ASCVD Risk score (Goff DC Jr., et al., 2013) failed to calculate for the following reasons:   The valid total cholesterol range is 130 to 320 mg/dL   Patient has failed these meds in past: pravastatin Patient is currently controlled on the following medications:  Atorvastatin 20mg, 1 58met once daily   We discussed:  diet and exercise extensively.  . How to reduce cholesterol through diet/weight management and physical activity.    . We discussed how a diet high in plant sterols (fruits/vegetables/nuts/whole grains/legumes) may reduce your cholesterol.  Encouraged increasing fiber to  a daily intake of 10-25g/day   Plan Patient overdue for lipid panel.  Continue current medications  Vitamin D deficiency   Patient has failed these meds in past: none Patient is currently controlled on the following medications:   Vitamin D in multi-vitamin  Vitamin D: 28.72 (09/04/2019)   Plan Continue current medications  Allergies   Patient is currently controlled on the following medications:   Loratadine 8m, 1 tablet once daily (currently not taking)  Triamcinolone (Nasacort) 536m/ act nasal spray, 2 sprays into nose daily as needed   Plan Continue current medications  Medication Management  Patient organizes medications: patient stated he lines up medications in a row.  - due forgetting atorvastatin: recommended using pill box. Patient agreed and stated he will pick one up next time he is at the  store.   Primary pharmacy: [facility]  Adherence: Patient to start using pillbox to help with adherence  Atorvastatin 205m1/18/2021)  Losartan 100m71m/07/2020)  repaglinide (10/08/2019)  Amlodipine (09/15/2019)  Follow-up  Will conduct general telephone calls for periodic check-ins before next visit.  Will be mailing patient assistance forms for Invokana. Encouraged to still pursue Extra Help program to aid for all medications.    AnneAnson CroftsarmD Clinical Pharmacist [phone]

## 2020-02-14 DIAGNOSIS — J309 Allergic rhinitis, unspecified: Secondary | ICD-10-CM | POA: Insufficient documentation

## 2020-02-14 NOTE — Patient Instructions (Addendum)
Visit Information  Goals Addressed            This Visit's Progress   . Pharmacy Care Plan       CARE PLAN ENTRY  Current Barriers:  . Chronic Disease Management support, education, and care coordination needs related to Hypertension, Hyperlipidemia, Diabetes, and Vitamin D deficiency, Allergies   Hypertension . Pharmacist Clinical Goal(s): o Over the next 180 days, patient will work with PharmD and providers to maintain BP goal < 130/80 . Current regimen:   Amlodipine 64m, 1 tablet once daily  Atenolol-chlorthalidone 100-239m 0.5 (one-half) tablet once daily  Losartan 5049m1 tablet once daily  . Interventions: o We discussed:  . DASH diet:  following a diet emphasizing fruits and vegetables and low-fat dairy products along with whole grains, fish, poultry, and nuts. Reducing red meats and sugars.  . Exercising . Patient self care activities - Over the next 180 days, patient will: o Check BP daily, document, and provide at future appointments o Ensure daily salt intake < 2300 mg/day  Hyperlipidemia . Pharmacist Clinical Goal(s): o Over the next180 days, patient will work with PharmD and providers to maintain LDL goal < 100 . Current regimen:  o Atorvastatin 35m18m tablet once daily  . Interventions: o We discussed: How to reduce cholesterol through diet/weight management and physical activity . Patient self care activities - Over the next 180 days, patient will: o Continue current medications  Diabetes . Pharmacist Clinical Goal(s): o Over the next 180 days, patient will work with PharmD and providers to maintain A1c goal < 7% . Current regimen:   Metformin 1000mg73mtablet twice daily with a meal  repaglinide 2mg, 77mablets before breakfast, 2 tablets before lunch, and 1 tablet before supper   Januvia 100mg, 43mblet once daily  . Interventions: o Discussed patient assistance programs for Invokana.  . Patient self care activities - Over the next 30 days,  patient will: . Follow up with endocrinologist for further management on Invokana since you have been out of it for 3 weeks.  o Check blood sugar daily, document, and provide at future appointments o Contact provider with any episodes of hypoglycemia  Medication management . Pharmacist Clinical Goal(s): o Over the next 180 days, patient will work with PharmD and providers to achieve optimal medication adherence . Current pharmacy: [facility] . Interventions o Comprehensive medication review performed. o Continue current medication management strategy . Patient self care activities - Over the next 180 days, patient will: o Focus on medication adherence by BUYING PILL BOX o Take medications as prescribed o Report any questions or concerns to PharmD and/or provider(s)  Initial goal documentation        Mr. DonnellJakubiakven information about Chronic Care Management services today including:  1. CCM service includes personalized support from designated clinical staff supervised by his physician, including individualized plan of care and coordination with other care providers 2. 24/7 contact phone numbers for assistance for urgent and routine care needs. 3. Standard insurance, coinsurance, copays and deductibles apply for chronic care management only during months in which we provide at least 20 minutes of these services. Most insurances cover these services at 100%, however patients may be responsible for any copay, coinsurance and/or deductible if applicable. This service may help you avoid the need for more expensive face-to-face services. 4. Only one practitioner may furnish and bill the service in a calendar month. 5. The patient may stop CCM services at any time (effective  at the end of the month) by phone call to the office staff.  Patient agreed to services and verbal consent obtained.   The patient verbalized understanding of instructions provided today and agreed to receive a  mailed copy of patient instruction and/or educational materials.  Anson Crofts, PharmD Clinical Pharmacist [facility] [place] [phone]   [place] stands for "Dietary Approaches to Stop Hypertension." The DASH eating plan is a healthy eating plan that has been shown to reduce high blood pressure (hypertension). It may also reduce your risk for type 2 diabetes, heart disease, and stroke. The DASH eating plan may also help with weight loss. What are tips for following this plan?  General guidelines  Avoid eating more than 2,300 mg (milligrams) of salt (sodium) a day. If you have hypertension, you may need to reduce your sodium intake to 1,500 mg a day.  Limit alcohol intake to no more than 1 drink a day for nonpregnant women and 2 drinks a day for men. One drink equals 12 oz of beer, 5 oz of wine, or 1 oz of hard liquor.  Work with your health care provider to maintain a healthy body weight or to lose weight. Ask what an ideal weight is for you.  Get at least 30 minutes of exercise that causes your heart to beat faster (aerobic exercise) most days of the week. Activities may include walking, swimming, or biking.  Work with your health care provider or diet and nutrition specialist (dietitian) to adjust your eating plan to your individual calorie needs. Reading food labels   Check food labels for the amount of sodium per serving. Choose foods with less than 5 percent of the Daily Value of sodium. Generally, foods with less than 300 mg of sodium per serving fit into this eating plan.  To find whole grains, look for the word "whole" as the first word in the ingredient list. Shopping  Buy products labeled as "low-sodium" or "no salt added."  Buy fresh foods. Avoid canned foods and premade or frozen meals. Cooking  Avoid adding salt when cooking. Use salt-free seasonings or herbs instead of table salt or sea salt. Check with your health care  provider or pharmacist before using salt substitutes.  Do not fry foods. Cook foods using healthy methods such as baking, boiling, grilling, and broiling instead.  Cook with heart-healthy oils, such as olive, canola, soybean, or sunflower oil. Meal planning  Eat a balanced diet that includes: ? 5 or more servings of fruits and vegetables each day. At each meal, try to fill half of your plate with fruits and vegetables. ? Up to 6-8 servings of whole grains each day. ? Less than 6 oz of lean meat, poultry, or fish each day. A 3-oz serving of meat is about the same size as a deck of cards. One egg equals 1 oz. ? 2 servings of low-fat dairy each day. ? A serving of nuts, seeds, or beans 5 times each week. ? Heart-healthy fats. Healthy fats called Omega-3 fatty acids are found in foods such as flaxseeds and coldwater fish, like sardines, salmon, and mackerel.  Limit how much you eat of the following: ? Canned or prepackaged foods. ? Food that is high in trans fat, such as fried foods. ? Food that is high in saturated fat, such as fatty meat. ? Sweets, desserts, sugary drinks, and other foods with added sugar. ? Full-fat dairy products.  Do not salt foods before  eating.  Try to eat at least 2 vegetarian meals each week.  Eat more home-cooked food and less restaurant, buffet, and fast food.  When eating at a restaurant, ask that your food be prepared with less salt or no salt, if possible. What foods are recommended? The items listed may not be a complete list. Talk with your dietitian about what dietary choices are best for you. Grains Whole-grain or whole-wheat bread. Whole-grain or whole-wheat pasta. Brown rice. [place]. Bulgur. Whole-grain and low-sodium cereals. Pita bread. Low-fat, low-sodium crackers. Whole-wheat flour tortillas. Vegetables Fresh or frozen vegetables (raw, steamed, roasted, or grilled). Low-sodium or reduced-sodium tomato and vegetable juice. Low-sodium or  reduced-sodium tomato sauce and tomato paste. Low-sodium or reduced-sodium canned vegetables. Fruits All fresh, dried, or frozen fruit. Canned fruit in natural juice (without added sugar). Meat and other protein foods Skinless chicken or [place]. Ground chicken or [place]. Pork with fat trimmed off. Fish and seafood. Egg whites. Dried beans, peas, or lentils. Unsalted nuts, nut butters, and seeds. Unsalted canned beans. Lean cuts of beef with fat trimmed off. Low-sodium, lean deli meat. Dairy Low-fat (1%) or fat-free (skim) milk. Fat-free, low-fat, or reduced-fat cheeses. Nonfat, low-sodium ricotta or cottage cheese. Low-fat or nonfat yogurt. Low-fat, low-sodium cheese. Fats and oils Soft margarine without trans fats. Vegetable oil. Low-fat, reduced-fat, or light mayonnaise and salad dressings (reduced-sodium). Canola, safflower, olive, soybean, and sunflower oils. Avocado. Seasoning and other foods Herbs. Spices. Seasoning mixes without salt. Unsalted popcorn and pretzels. Fat-free sweets. What foods are not recommended? The items listed may not be a complete list. Talk with your dietitian about what dietary choices are best for you. Grains Baked goods made with fat, such as croissants, muffins, or some breads. Dry pasta or rice meal packs. Vegetables Creamed or fried vegetables. Vegetables in a cheese sauce. Regular canned vegetables (not low-sodium or reduced-sodium). Regular canned tomato sauce and paste (not low-sodium or reduced-sodium). Regular tomato and vegetable juice (not low-sodium or reduced-sodium). Angie Fava. Olives. Fruits Canned fruit in a light or heavy syrup. Fried fruit. Fruit in cream or butter sauce. Meat and other protein foods Fatty cuts of meat. Ribs. Fried meat. Berniece Salines. Sausage. Bologna and other processed lunch meats. Salami. Fatback. Hotdogs. Bratwurst. Salted nuts and seeds. Canned beans with added salt. Canned or smoked fish. Whole eggs or egg yolks. Chicken or [place]  with skin. Dairy Whole or 2% milk, cream, and half-and-half. Whole or full-fat cream cheese. Whole-fat or sweetened yogurt. Full-fat cheese. Nondairy creamers. Whipped toppings. Processed cheese and cheese spreads. Fats and oils Butter. Stick margarine. Lard. Shortening. Ghee. Bacon fat. Tropical oils, such as coconut, palm kernel, or palm oil. Seasoning and other foods Salted popcorn and pretzels. Onion salt, garlic salt, seasoned salt, table salt, and sea salt. Worcestershire sauce. Tartar sauce. Barbecue sauce. Teriyaki sauce. Soy sauce, including reduced-sodium. Steak sauce. Canned and packaged gravies. Fish sauce. Oyster sauce. Cocktail sauce. Horseradish that you find on the shelf. Ketchup. Mustard. Meat flavorings and tenderizers. Bouillon cubes. Hot sauce and Tabasco sauce. Premade or packaged marinades. Premade or packaged taco seasonings. Relishes. Regular salad dressings. Where to find more information:  National Heart, Lung, and [place]: https://wilson-eaton.com/  American Heart Association: www.heart.org Summary  The DASH eating plan is a healthy eating plan that has been shown to reduce high blood pressure (hypertension). It may also reduce your risk for type 2 diabetes, heart disease, and stroke.  With the DASH eating plan, you should limit salt (sodium) intake to 2,300 mg a  day. If you have hypertension, you may need to reduce your sodium intake to 1,500 mg a day.  When on the DASH eating plan, aim to eat more fresh fruits and vegetables, whole grains, lean proteins, low-fat dairy, and heart-healthy fats.  Work with your health care provider or diet and nutrition specialist (dietitian) to adjust your eating plan to your individual calorie needs. This information is not intended to replace advice given to you by your health care provider. Make sure you discuss any questions you have with your health care provider. Document Revised: 09/15/2017 Document Reviewed:  09/26/2016 Elsevier Patient Education  2020 [employer].

## 2020-02-18 ENCOUNTER — Other Ambulatory Visit: Payer: Self-pay | Admitting: Internal Medicine

## 2020-02-18 DIAGNOSIS — E1165 Type 2 diabetes mellitus with hyperglycemia: Secondary | ICD-10-CM

## 2020-03-12 ENCOUNTER — Telehealth: Payer: PPO

## 2020-03-13 ENCOUNTER — Other Ambulatory Visit: Payer: Self-pay

## 2020-03-20 ENCOUNTER — Other Ambulatory Visit: Payer: Self-pay | Admitting: Family Medicine

## 2020-04-23 ENCOUNTER — Other Ambulatory Visit: Payer: Self-pay

## 2020-04-23 ENCOUNTER — Ambulatory Visit (INDEPENDENT_AMBULATORY_CARE_PROVIDER_SITE_OTHER): Payer: PPO | Admitting: Family Medicine

## 2020-04-23 ENCOUNTER — Encounter: Payer: Self-pay | Admitting: Family Medicine

## 2020-04-23 VITALS — BP 110/78 | HR 79 | Temp 97.4°F | Ht 70.0 in | Wt 245.1 lb

## 2020-04-23 DIAGNOSIS — E785 Hyperlipidemia, unspecified: Secondary | ICD-10-CM

## 2020-04-23 DIAGNOSIS — R202 Paresthesia of skin: Secondary | ICD-10-CM

## 2020-04-23 DIAGNOSIS — I10 Essential (primary) hypertension: Secondary | ICD-10-CM

## 2020-04-23 DIAGNOSIS — E1169 Type 2 diabetes mellitus with other specified complication: Secondary | ICD-10-CM
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
## 2020-04-24 ENCOUNTER — Other Ambulatory Visit (INDEPENDENT_AMBULATORY_CARE_PROVIDER_SITE_OTHER): Payer: PPO

## 2020-04-24 DIAGNOSIS — R202 Paresthesia of skin: Secondary | ICD-10-CM

## 2020-04-24 DIAGNOSIS — I10 Essential (primary) hypertension: Secondary | ICD-10-CM

## 2020-04-24 LAB — CBC WITH DIFFERENTIAL/PLATELET
Absolute Monocytes: 629 cells/uL (ref 200–950)
Basophils Absolute: 60 cells/uL (ref 0–200)
Basophils Relative: 0.7 %
Eosinophils Absolute: 196 cells/uL (ref 15–500)
Eosinophils Relative: 2.3 %
HCT: 42.3 % (ref 38.5–50.0)
Hemoglobin: 14 g/dL (ref 13.2–17.1)
Lymphs Abs: 2992 cells/uL (ref 850–3900)
MCH: 28.3 pg (ref 27.0–33.0)
MCHC: 33.1 g/dL (ref 32.0–36.0)
MCV: 85.6 fL (ref 80.0–100.0)
MPV: 11.3 fL (ref 7.5–12.5)
Monocytes Relative: 7.4 %
Neutro Abs: 4624 cells/uL (ref 1500–7800)
Neutrophils Relative %: 54.4 %
Platelets: 280 10*3/uL (ref 140–400)
RBC: 4.94 10*6/uL (ref 4.20–5.80)
RDW: 13.6 % (ref 11.0–15.0)
Total Lymphocyte: 35.2 %
WBC: 8.5 10*3/uL (ref 3.8–10.8)

## 2020-04-24 LAB — FOLATE: Folate: 18.4 ng/mL

## 2020-04-24 LAB — BASIC METABOLIC PANEL
BUN: 15 mg/dL (ref 7–25)
CO2: 25 mmol/L (ref 20–32)
Calcium: 10.3 mg/dL (ref 8.6–10.3)
Chloride: 102 mmol/L (ref 98–110)
Creat: 1.13 mg/dL (ref 0.70–1.18)
Glucose, Bld: 168 mg/dL — ABNORMAL HIGH (ref 65–99)
Potassium: 4 mmol/L (ref 3.5–5.3)
Sodium: 139 mmol/L (ref 135–146)

## 2020-04-24 LAB — T4, FREE: Free T4: 0.9 ng/dL (ref 0.8–1.8)

## 2020-04-24 LAB — TSH: TSH: 2.26 mIU/L (ref 0.40–4.50)

## 2020-04-24 LAB — VITAMIN B12: Vitamin B-12: 384 pg/mL (ref 200–1100)

## 2020-04-27 ENCOUNTER — Other Ambulatory Visit: Payer: PPO

## 2020-04-28 ENCOUNTER — Encounter: Payer: Self-pay | Admitting: Family Medicine

## 2020-04-29 ENCOUNTER — Other Ambulatory Visit: Payer: Self-pay

## 2020-04-29 ENCOUNTER — Ambulatory Visit (INDEPENDENT_AMBULATORY_CARE_PROVIDER_SITE_OTHER): Payer: PPO | Admitting: *Deleted

## 2020-04-29 DIAGNOSIS — E538 Deficiency of other specified B group vitamins: Secondary | ICD-10-CM | POA: Diagnosis not present

## 2020-04-29 MED ORDER — CYANOCOBALAMIN 1000 MCG/ML IJ SOLN
1000.0000 ug | Freq: Once | INTRAMUSCULAR | Status: AC
  Administered 2020-04-29: 1000 ug via INTRAMUSCULAR

## 2020-04-29 NOTE — Progress Notes (Signed)
Per orders of Dr. Volanda Napoleon, injection of 1st monthly B 12 given by Zacarias Pontes. Patient tolerated injection well.

## 2020-05-07 ENCOUNTER — Ambulatory Visit: Payer: PPO | Admitting: Internal Medicine

## 2020-05-17 ENCOUNTER — Other Ambulatory Visit: Payer: Self-pay | Admitting: Family Medicine

## 2020-05-17 DIAGNOSIS — E876 Hypokalemia: Secondary | ICD-10-CM

## 2020-05-19 ENCOUNTER — Telehealth: Payer: Self-pay

## 2020-05-19 NOTE — Progress Notes (Addendum)
Chronic Care Management Pharmacy Assistant   Name: John Hobbs  MRN: [id number] DOB: [birth year]  Reason for Encounter: Disease State  Patient Questions:  1.  Have you seen any other providers since your last visit? Yes  2.  Any changes in your medicines or health? No  PCP : Billie Ruddy, MD  Allergies:  No Known Allergies   Their chronic conditions include: HTN, DM, HLD, vitamin D deficiency, allergies  Office Visits: 04-23-2020 (PCP). Patient presented in the office with Dr. Volanda Napoleon for 3 month follow-up. Complained of sharp pain in left third toe every 20 seconds that kept him up at night.  Sensation is since resolved. Patient also stated he had numbness in right big toe that has since resolved. Paresthesia was discussed with Dr. Volanda Napoleon and patient, and some possible causes including known diabetic neuropathy, vitamin deficiencies, nerve compression from shoes. Planned for a foot exam at the next visit.   Medications: Outpatient Encounter Medications as of 05/19/2020  Medication Sig   amLODipine (NORVASC) 5 MG tablet TAKE 1 TABLET BY MOUTH DAILY   aspirin EC 81 MG tablet Take 81 mg by mouth daily after breakfast.   atenolol-chlorthalidone (TENORETIC) 100-25 MG tablet TAKE 1/2 TABLET BY MOUTH DAILY   atorvastatin (LIPITOR) 20 MG tablet TAKE 1 TABLET BY MOUTH EVERY DAY   canagliflozin (INVOKANA) 300 MG TABS tablet Take 1 tablet (300 mg total) by mouth daily before breakfast. (Patient not taking: Reported on 02/11/2020)   glucose blood (ONETOUCH VERIO) test strip 1 each by Other route daily. Use to check blood sugar once daily   glucose blood test strip Use as instructed for checking fsbs TID.   Lancets (ONETOUCH ULTRASOFT) lancets Use to check blood sugar once daily   loratadine (CLARITIN) 10 MG tablet TAKE 1 TABLET(10 MG) BY MOUTH DAILY (Patient not taking: Reported on 02/11/2020)   losartan (COZAAR) 50 MG tablet Take 1 tablet (50 mg total) by mouth daily.   metFORMIN  (GLUCOPHAGE) 1000 MG tablet Take 1 tablet (1,000 mg total) by mouth 2 (two) times daily with a meal.   ONE TOUCH LANCETS MISC Test once day   potassium chloride SA (KLOR-CON) 20 MEQ tablet TAKE 1/2 TABLET(10 MEQ) BY MOUTH DAILY   repaglinide (PRANDIN) 2 MG tablet Take 2 tablets (4 mg total) by mouth daily before breakfast AND 2 tablets (4 mg total) daily before lunch AND 0.5 tablets (1 mg total) daily before supper. Take 2 tablets by Mouth three times daily before meals . (Patient taking differently: Take 2 tablets (4 mg total) by mouth daily before breakfast AND 2 tablets (4 mg total) daily before lunch AND 1 tablet (2 mg total) daily before supper. Tak)   sitaGLIPtin (JANUVIA) 100 MG tablet Take 1 tablet (100 mg total) by mouth daily.   triamcinolone (NASACORT) 55 MCG/ACT AERO nasal inhaler Place 2 sprays into the nose daily as needed (congestion).   Vitamin D, Ergocalciferol, (DRISDOL) 1.25 MG (50000 UT) CAPS capsule Take 1 capsule (50,000 Units total) by mouth every 7 (seven) days. (Patient not taking: Reported on 02/11/2020)   No facility-administered encounter medications on file as of 05/19/2020.    Current Diagnosis: Patient Active Problem List   Diagnosis Date Noted   Allergic rhinitis    Type 2 diabetes mellitus with hyperglycemia, without long-term current use of insulin ([place]) 10/10/2019   Unilateral primary osteoarthritis, right knee 05/13/2019   Status post arthroscopy of right knee 12/19/2018   Chronic left shoulder pain 03/13/2018  Impingement syndrome of left shoulder 03/13/2018   Morbid obesity ([place]) 09/21/2017   Hyperlipidemia associated with type 2 diabetes mellitus ([place]) 01/24/2017   Acute medial meniscal tear 08/10/2015   NASH (nonalcoholic steatohepatitis) 07/28/2015   Type 2 diabetes mellitus with neurological manifestations, uncontrolled ([place]) 12/30/2013   Hypertension associated with diabetes ([place]) 07/02/2008    Goals Addressed   None     Recent Relevant  Labs: Lab Results  Component Value Date/Time   HGBA1C 6.5 (A) 01/06/2020 10:25 AM   HGBA1C 8.2 (A) 08/12/2019 08:04 AM   HGBA1C 8.3 (H) 01/30/2018 08:11 AM   HGBA1C 7.5 (H) 05/04/2017 10:11 AM   MICROALBUR 0.7 12/22/2015 09:02 AM   MICROALBUR 0.5 08/20/2014 09:41 AM    Kidney Function Lab Results  Component Value Date/Time   CREATININE 1.13 04/24/2020 02:07 PM   CREATININE 1.14 09/18/2019 02:22 PM   CREATININE 1.28 09/04/2019 12:23 PM   CREATININE 1.01 10/22/2013 10:11 AM   GFR 76.63 09/18/2019 02:22 PM   GFRNONAA >60 08/17/2019 02:48 PM   GFRNONAA 78 10/22/2013 10:11 AM   GFRAA >60 08/17/2019 02:48 PM   GFRAA >89 10/22/2013 10:11 AM    Current antihyperglycemic regimen:  Metformin 1028m, 1 tablet twice daily with a meal repaglinide 227m 2 tablets before breakfast, 2 tablets before lunch, and 1 tablet before supper  Januvia 1004m1 tablet once daily  Patient is not taking Invokana 300 mg  What recent interventions/DTPs have been made to improve glycemic control:  Encourage lifestyle modifications including diet and exercise.  Have there been any recent hospitalizations or ED visits since last visit with CPP? No  Patient denies hyperglycemic symptoms. How often are you checking your blood sugar? once daily What are your blood sugars ranging?  Fasting: patient states sugars runs between 130 and 170  During the week, how often does your blood glucose drop below 70? Never Are you checking your feet daily/regularly? Yes  Adherence Review: Is the patient currently on a STATIN medication? Yes Is the patient currently on ACE/ARB medication? Yes  IveLafonda MossesCMASt. Francisvillearmacist Assistant [phone]

## 2020-05-27 NOTE — Progress Notes (Signed)
Called patient to follow up if still taking Invokana and if it has been discussed with endocrinology. Reported blood glucose readings have been higher then previously visits.   Patient reported he has an office visit with endocrinology on 06/01/2020 and plans to discuss need for Invokana after A1c recheck. Instructed patient he can call CCM team for further assistance for medication costs if needed.

## 2020-06-01 ENCOUNTER — Ambulatory Visit (INDEPENDENT_AMBULATORY_CARE_PROVIDER_SITE_OTHER): Payer: PPO

## 2020-06-01 ENCOUNTER — Other Ambulatory Visit: Payer: Self-pay

## 2020-06-01 DIAGNOSIS — E538 Deficiency of other specified B group vitamins: Secondary | ICD-10-CM

## 2020-06-01 MED ORDER — CYANOCOBALAMIN 1000 MCG/ML IJ SOLN
1000.0000 ug | Freq: Once | INTRAMUSCULAR | Status: AC
  Administered 2020-06-01: 1000 ug via INTRAMUSCULAR

## 2020-06-01 NOTE — Progress Notes (Signed)
Per orders of Dr. Volanda Napoleon, injection of Cyanocobalamin 1,000 mcg/mL given by Wyvonne Lenz. Patient tolerated injection well.

## 2020-06-09 ENCOUNTER — Other Ambulatory Visit: Payer: Self-pay | Admitting: Family Medicine

## 2020-06-09 DIAGNOSIS — I10 Essential (primary) hypertension: Secondary | ICD-10-CM

## 2020-06-11 ENCOUNTER — Telehealth: Payer: Self-pay | Admitting: Family Medicine

## 2020-06-11 ENCOUNTER — Other Ambulatory Visit: Payer: Self-pay

## 2020-06-11 NOTE — Progress Notes (Signed)
Erroneous encounter

## 2020-06-11 NOTE — Telephone Encounter (Signed)
Called patient and spoke with a family member that informed me that patient was not home and would need to call back and reschedule appointment. I advised to have patient call front desk to reschedule medicare wellness visit.
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
## 2020-07-01 ENCOUNTER — Other Ambulatory Visit: Payer: Self-pay

## 2020-07-02 ENCOUNTER — Encounter: Payer: Self-pay | Admitting: Family Medicine

## 2020-07-02 ENCOUNTER — Ambulatory Visit (INDEPENDENT_AMBULATORY_CARE_PROVIDER_SITE_OTHER): Payer: PPO | Admitting: Family Medicine

## 2020-07-02 VITALS — BP 110/76 | HR 66 | Temp 98.5°F | Wt 241.0 lb

## 2020-07-02 DIAGNOSIS — E538 Deficiency of other specified B group vitamins: Secondary | ICD-10-CM | POA: Diagnosis not present

## 2020-07-02 DIAGNOSIS — Z23 Encounter for immunization: Secondary | ICD-10-CM

## 2020-07-02 DIAGNOSIS — I10 Essential (primary) hypertension: Secondary | ICD-10-CM

## 2020-07-02 DIAGNOSIS — E1149 Type 2 diabetes mellitus with other diabetic neurological complication: Secondary | ICD-10-CM | POA: Diagnosis not present

## 2020-07-02 LAB — POCT GLYCOSYLATED HEMOGLOBIN (HGB A1C): Hemoglobin A1C: 7.1 % — AB (ref 4.0–5.6)

## 2020-07-02 MED ORDER — CYANOCOBALAMIN 1000 MCG/ML IJ SOLN
1000.0000 ug | Freq: Once | INTRAMUSCULAR | Status: AC
  Administered 2020-07-02: 1000 ug via INTRAMUSCULAR
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
## 2020-07-02 NOTE — Patient Instructions (Signed)
Vitamin B12 Deficiency Vitamin B12 deficiency occurs when the body does not have enough vitamin B12, which is an important vitamin. The body needs this vitamin:  To make red blood cells.  To make DNA. This is the genetic material inside cells.  To help the nerves work properly so they can carry messages from the brain to the body. Vitamin B12 deficiency can cause various health problems, such as a low red blood cell count (anemia) or nerve damage. What are the causes? This condition may be caused by:  Not eating enough foods that contain vitamin B12.  Not having enough stomach acid and digestive fluids to properly absorb vitamin B12 from the food that you eat.  Certain digestive system diseases that make it hard to absorb vitamin B12. These diseases include Crohn's disease, chronic pancreatitis, and cystic fibrosis.  A condition in which the body does not make enough of a protein (intrinsic factor), resulting in too few red blood cells (pernicious anemia).  Having a surgery in which part of the stomach or small intestine is removed.  Taking certain medicines that make it hard for the body to absorb vitamin B12. These medicines include: ? Heartburn medicines (antacids and proton pump inhibitors). ? Certain antibiotic medicines. ? Some medicines that are used to treat diabetes, tuberculosis, gout, or high cholesterol. What increases the risk? The following factors may make you more likely to develop a B12 deficiency:  Being older than age 8.  Eating a vegetarian or vegan diet, especially while you are pregnant.  Eating a poor diet while you are pregnant.  Taking certain medicines.  Having alcoholism. What are the signs or symptoms? In some cases, there are no symptoms of this condition. If the condition leads to anemia or nerve damage, various symptoms can occur, such as:  Weakness.  Fatigue.  Loss of appetite.  Weight loss.  Numbness or tingling in your hands and  feet.  Redness and burning of the tongue.  Confusion or memory problems.  Depression.  Sensory problems, such as color blindness, ringing in the ears, or loss of taste.  Diarrhea or constipation.  Trouble walking. If anemia is severe, symptoms can include:  Shortness of breath.  Dizziness.  Rapid heart rate (tachycardia). How is this diagnosed? This condition may be diagnosed with a blood test to measure the level of vitamin B12 in your blood. You may also have other tests, including:  A group of tests that measure certain characteristics of blood cells (complete blood count, CBC).  A blood test to measure intrinsic factor.  A procedure where a thin tube with a camera on the end is used to look into your stomach or intestines (endoscopy). Other tests may be needed to discover the cause of B12 deficiency. How is this treated? Treatment for this condition depends on the cause. This condition may be treated by:  Changing your eating and drinking habits, such as: ? Eating more foods that contain vitamin B12. ? Drinking less alcohol or no alcohol.  Getting vitamin B12 injections.  Taking vitamin B12 supplements. Your health care provider will tell you which dosage is best for you. Follow these instructions at home: Eating and drinking   Eat lots of healthy foods that contain vitamin B12, including: ? Meats and poultry. This includes beef, pork, chicken, [place], and organ meats, such as liver. ? Seafood. This includes clams, rainbow trout, salmon, tuna, and haddock. ? Eggs. ? Cereal and dairy products that are fortified. This means that vitamin B12  has been added to the food. Check the label on the package to see if the food is fortified. The items listed above may not be a complete list of recommended foods and beverages. Contact a dietitian for more information. General instructions  Get any injections that are prescribed by your health care provider.  Take  supplements only as told by your health care provider. Follow the directions carefully.  Do not drink alcohol if your health care provider tells you not to. In some cases, you may only be asked to limit alcohol use.  Keep all follow-up visits as told by your health care provider. This is important. Contact a health care provider if:  Your symptoms come back. Get help right away if you:  Develop shortness of breath.  Have a rapid heart rate.  Have chest pain.  Become dizzy or lose consciousness. Summary  Vitamin B12 deficiency occurs when the body does not have enough vitamin B12.  The main causes of vitamin B12 deficiency include dietary deficiency, digestive diseases, pernicious anemia, and having a surgery in which part of the stomach or small intestine is removed.  In some cases, there are no symptoms of this condition. If the condition leads to anemia or nerve damage, various symptoms can occur, such as weakness, shortness of breath, and numbness.  Treatment may include getting vitamin B12 injections or taking vitamin B12 supplements. Eat lots of healthy foods that contain vitamin B12. This information is not intended to replace advice given to you by your health care provider. Make sure you discuss any questions you have with your health care provider. Document Revised: 03/22/2019 Document Reviewed: 06/12/2018 Elsevier Patient Education  [place].  Diabetes Mellitus and Nutrition, Adult When you have diabetes (diabetes mellitus), it is very important to have healthy eating habits because your blood sugar (glucose) levels are greatly affected by what you eat and drink. Eating healthy foods in the appropriate amounts, at about the same times every day, can help you:  Control your blood glucose.  Lower your risk of heart disease.  Improve your blood pressure.  Reach or maintain a healthy weight. Every person with diabetes is different, and each person has different  needs for a meal plan. Your health care provider may recommend that you work with a diet and nutrition specialist (dietitian) to make a meal plan that is best for you. Your meal plan may vary depending on factors such as:  The calories you need.  The medicines you take.  Your weight.  Your blood glucose, blood pressure, and cholesterol levels.  Your activity level.  Other health conditions you have, such as heart or kidney disease. How do carbohydrates affect me? Carbohydrates, also called carbs, affect your blood glucose level more than any other type of food. Eating carbs naturally raises the amount of glucose in your blood. Carb counting is a method for keeping track of how many carbs you eat. Counting carbs is important to keep your blood glucose at a healthy level, especially if you use insulin or take certain oral diabetes medicines. It is important to know how many carbs you can safely have in each meal. This is different for every person. Your dietitian can help you calculate how many carbs you should have at each meal and for each snack. Foods that contain carbs include:  Bread, cereal, rice, pasta, and crackers.  Potatoes and corn.  Peas, beans, and lentils.  Milk and yogurt.  Fruit and juice.  Desserts, such  as cakes, cookies, ice cream, and candy. How does alcohol affect me? Alcohol can cause a sudden decrease in blood glucose (hypoglycemia), especially if you use insulin or take certain oral diabetes medicines. Hypoglycemia can be a life-threatening condition. Symptoms of hypoglycemia (sleepiness, dizziness, and confusion) are similar to symptoms of having too much alcohol. If your health care provider says that alcohol is safe for you, follow these guidelines:  Limit alcohol intake to no more than 1 drink per day for nonpregnant women and 2 drinks per day for men. One drink equals 12 oz of beer, 5 oz of wine, or 1 oz of hard liquor.  Do not drink on an empty  stomach.  Keep yourself hydrated with water, diet soda, or unsweetened iced tea.  Keep in mind that regular soda, juice, and other mixers may contain a lot of sugar and must be counted as carbs. What are tips for following this plan?  Reading food labels  Start by checking the serving size on the "Nutrition Facts" label of packaged foods and drinks. The amount of calories, carbs, fats, and other nutrients listed on the label is based on one serving of the item. Many items contain more than one serving per package.  Check the total grams (g) of carbs in one serving. You can calculate the number of servings of carbs in one serving by dividing the total carbs by 15. For example, if a food has 30 g of total carbs, it would be equal to 2 servings of carbs.  Check the number of grams (g) of saturated and trans fats in one serving. Choose foods that have low or no amount of these fats.  Check the number of milligrams (mg) of salt (sodium) in one serving. Most people should limit total sodium intake to less than 2,300 mg per day.  Always check the nutrition information of foods labeled as "low-fat" or "nonfat". These foods may be higher in added sugar or refined carbs and should be avoided.  Talk to your dietitian to identify your daily goals for nutrients listed on the label. Shopping  Avoid buying canned, premade, or processed foods. These foods tend to be high in fat, sodium, and added sugar.  Shop around the outside edge of the grocery store. This includes fresh fruits and vegetables, bulk grains, fresh meats, and fresh dairy. Cooking  Use low-heat cooking methods, such as baking, instead of high-heat cooking methods like deep frying.  Cook using healthy oils, such as olive, canola, or sunflower oil.  Avoid cooking with butter, cream, or high-fat meats. Meal planning  Eat meals and snacks regularly, preferably at the same times every day. Avoid going long periods of time without  eating.  Eat foods high in fiber, such as fresh fruits, vegetables, beans, and whole grains. Talk to your dietitian about how many servings of carbs you can eat at each meal.  Eat 4-6 ounces (oz) of lean protein each day, such as lean meat, chicken, fish, eggs, or tofu. One oz of lean protein is equal to: ? 1 oz of meat, chicken, or fish. ? 1 egg. ?  cup of tofu.  Eat some foods each day that contain healthy fats, such as avocado, nuts, seeds, and fish. Lifestyle  Check your blood glucose regularly.  Exercise regularly as told by your health care provider. This may include: ? 150 minutes of moderate-intensity or vigorous-intensity exercise each week. This could be brisk walking, biking, or water aerobics. ? Stretching and doing strength exercises,  such as yoga or weightlifting, at least 2 times a week.  Take medicines as told by your health care provider.  Do not use any products that contain nicotine or tobacco, such as cigarettes and e-cigarettes. If you need help quitting, ask your health care provider.  Work with a Social worker or diabetes educator to identify strategies to manage stress and any emotional and social challenges. Questions to ask a health care provider  Do I need to meet with a diabetes educator?  Do I need to meet with a dietitian?  What number can I call if I have questions?  When are the best times to check my blood glucose? Where to find more information:  American Diabetes Association: diabetes.org  Academy of Nutrition and Dietetics: www.eatright.[employer] of Diabetes and Digestive and Kidney Diseases (NIH): [employer] Summary  A healthy meal plan will help you control your blood glucose and maintain a healthy lifestyle.  Working with a diet and nutrition specialist (dietitian) can help you make a meal plan that is best for you.  Keep in mind that carbohydrates (carbs) and alcohol have immediate effects on your blood glucose  levels. It is important to count carbs and to use alcohol carefully. This information is not intended to replace advice given to you by your health care provider. Make sure you discuss any questions you have with your health care provider. Document Revised: 09/15/2017 Document Reviewed: 11/07/2016 Elsevier Patient Education  [place].  Diabetic Neuropathy Diabetic neuropathy refers to nerve damage that is caused by diabetes (diabetes mellitus). Over time, people with diabetes can develop nerve damage throughout the body. There are several types of diabetic neuropathy:  Peripheral neuropathy. This is the most common type of diabetic neuropathy. It causes damage to nerves that carry signals between the spinal cord and other parts of the body (peripheral nerves). This usually affects nerves in the feet and legs first, and may eventually affect the hands and arms. The damage affects the ability to sense touch or temperature.  Autonomic neuropathy. This type causes damage to nerves that control involuntary functions (autonomic nerves). These nerves carry signals that control: ? Heartbeat. ? Body temperature. ? Blood pressure. ? Urination. ? Digestion. ? Sweating. ? Sexual function. ? Response to changing blood sugar (glucose) levels.  Focal neuropathy. This type of nerve damage affects one area of the body, such as an arm, a leg, or the face. The injury may involve one nerve or a small group of nerves. Focal neuropathy can be painful and unpredictable, and occurs most often in older adults with diabetes. This often develops suddenly, but usually improves over time and does not cause long-term problems.  Proximal neuropathy. This type of nerve damage affects the nerves of the thighs, hips, buttocks, or legs. It causes severe pain, weakness, and muscle death (atrophy), usually in the thigh muscles. It is more common among older men and people who have type 2 diabetes. The length of recovery  time may vary. What are the causes? Peripheral, autonomic, and focal neuropathies are caused by diabetes that is not well controlled with treatment. The cause of proximal neuropathy is not known, but it may be caused by inflammation related to uncontrolled blood glucose levels. What are the signs or symptoms? Peripheral neuropathy Peripheral neuropathy develops slowly over time. When the nerves of the feet and legs no longer work, you may experience:  Burning, stabbing, or aching pain in the legs or feet.  Pain or cramping in the  legs or feet.  Loss of feeling (numbness) and inability to feel pressure or pain in the feet. This can lead to: ? Thick calluses or sores on areas of constant pressure. ? Ulcers. ? Reduced ability to feel temperature changes.  Foot deformities.  Muscle weakness.  Loss of balance or coordination. Autonomic neuropathy The symptoms of autonomic neuropathy vary depending on which nerves are affected. Symptoms may include:  Problems with digestion, such as: ? Nausea or vomiting. ? Poor appetite. ? Bloating. ? Diarrhea or constipation. ? Trouble swallowing. ? Losing weight without trying to.  Problems with the heart, blood and lungs, such as: ? Dizziness, especially when standing up. ? Fainting. ? Shortness of breath. ? Irregular heartbeat.  Bladder problems, such as: ? Trouble starting or stopping urination. ? Leaking urine. ? Trouble emptying the bladder. ? Urinary tract infections (UTIs).  Problems with other body functions, such as: ? Sweat. You may sweat too much or too little. ? Temperature. You might get hot easily. Or, you might feel cold more than usual. ? Sexual function. Men may not be able to get or maintain an erection. Women may have vaginal dryness and difficulty with arousal. Focal neuropathy Symptoms affect only one area of the body. Common symptoms include:  Numbness.  Tingling.  Burning pain.  Prickling feeling.  Very  sensitive skin.  Weakness.  Inability to move (paralysis).  Muscle twitching.  Muscles getting smaller (wasting).  Poor coordination.  Double or blurred vision. Proximal neuropathy  Sudden, severe pain in the hip, thigh, or buttocks. Pain may spread from the back into the legs (sciatica).  Pain and numbness in the arms and legs.  Tingling.  Loss of bladder control or bowel control.  Weakness and wasting of thigh muscles.  Difficulty getting up from a seated position.  Abdominal swelling.  Unexplained weight loss. How is this diagnosed? Diagnosis usually involves reviewing your medical history and any symptoms you have. Diagnosis varies depending on the type of neuropathy your health care provider suspects. Peripheral neuropathy Your health care provider will check areas that are affected by your nervous system (neurologic exam), such as your reflexes, how you move, and what you can feel. You may have other tests, such as:  Blood tests.  Removal and examination of fluid that surrounds the spinal cord (lumbar puncture).  CT scan.  MRI.  A test to check the nerves that control muscles (electromyogram, EMG).  Tests of how quickly messages pass through your nerves (nerve conduction velocity tests).  Removal of a small piece of nerve to be examined under a microscope (biopsy). Autonomic neuropathy You may have tests, such as:  Tests to measure your blood pressure and heart rate. This may include monitoring you while you are safely secured to an exam table that moves you from a lying position to an upright position (table tilt test).  Breathing tests to check your lungs.  Tests to check how food moves through the digestive system (gastric emptying tests).  Blood, sweat, or urine tests.  Ultrasound of your bladder.  Spinal fluid tests. Focal neuropathy This condition may be diagnosed with:  A neurologic exam.  CT scan.  MRI.  EMG.  Nerve conduction  velocity tests. Proximal neuropathy There is no test to diagnose this type of neuropathy. You may have tests to rule out other possible causes of this type of neuropathy. Tests may include:  X-rays of your spine and lumbar region.  Lumbar puncture.  MRI. How is this treated? The  goal of treatment is to keep nerve damage from getting worse. The most important part of treatment is keeping your blood glucose level and your A1C level within your target range by following your diabetes management plan. Over time, maintaining lower blood glucose levels helps lessen symptoms. In some cases, you may need prescription pain medicine. Follow these instructions at home:  Lifestyle   Do not use any products that contain nicotine or tobacco, such as cigarettes and e-cigarettes. If you need help quitting, ask your health care provider.  Be physically active every day. Include strength training and balance exercises.  Follow a healthy meal plan.  Work with your health care provider to manage your blood pressure. General instructions  Follow your diabetes management plan as directed. ? Check your blood glucose levels as directed by your health care provider. ? Keep your blood glucose in your target range as directed by your health care provider. ? Have your A1C level checked at least two times a year, or as often as told by your health care provider.  Take over the counter and prescription medicines only as told by your health care provider. This includes insulin and diabetes medicine.  Do not drive or use heavy machinery while taking prescription pain medicines.  Check your skin and feet every day for cuts, bruises, redness, blisters, or sores.  Keep all follow up visits as told by your health care provider. This is important. Contact a health care provider if:  You have burning, stabbing, or aching pain in your legs or feet.  You are unable to feel pressure or pain in your feet.  You  develop problems with digestion, such as: ? Nausea. ? Vomiting. ? Bloating. ? Constipation. ? Diarrhea. ? Abdominal pain.  You have difficulty with urination, such as inability: ? To control when you urinate (incontinence). ? To completely empty the bladder (retention).  You have palpitations.  You feel dizzy, weak, or faint when you stand up. Get help right away if:  You cannot urinate.  You have sudden weakness or loss of coordination.  You have trouble speaking.  You have pain or pressure in your chest.  You have an irregular heart beat.  You have sudden inability to move a part of your body. Summary  Diabetic neuropathy refers to nerve damage that is caused by diabetes. It can affect nerves throughout the entire body, causing numbness and pain in the arms, legs, digestive tract, heart, and other body systems.  Keep your blood glucose level and your blood pressure in your target range, as directed by your health care provider. This can help prevent neuropathy from getting worse.  Check your skin and feet every day for cuts, bruises, redness, blisters, or sores.  Do not use any products that contain nicotine or tobacco, such as cigarettes and e-cigarettes. If you need help quitting, ask your health care provider. This information is not intended to replace advice given to you by your health care provider. Make sure you discuss any questions you have with your health care provider. Document Revised: 11/15/2017 Document Reviewed: 11/07/2016 Elsevier Patient Education  2020 [employer].

## 2020-07-13 ENCOUNTER — Telehealth: Payer: Self-pay | Admitting: Family Medicine

## 2020-07-13 NOTE — Telephone Encounter (Signed)
FYI Spoke with pt state that his insurance will not cover [place]. Pt gave alternative medication the insurance will cover, Glipizide, Glimipiride or Actos, Pt state that he did well on Actos a few years ago and would like to take that as alternative. Pt state that he will wait until Dr Volanda Napoleon returns to the office on Wednesday.

## 2020-07-13 NOTE — Telephone Encounter (Signed)
Pt called the insurance and they gave him 3 different generic names for Rx Januvia 100 MG to see if it is okay for the pt to change to Glipizide,  Glimepiride and pioglitazone.  Pt would like to have a call back to discuss.  Pt is aware that the provider is not in the office will be returning on Wednesday.

## 2020-07-24 ENCOUNTER — Ambulatory Visit: Payer: PPO | Attending: Internal Medicine

## 2020-07-24 DIAGNOSIS — Z23 Encounter for immunization: Secondary | ICD-10-CM

## 2020-07-24 NOTE — Progress Notes (Signed)
   [facility]  Name:  John Hobbs    MRN: [id number] DOB: [birth year]  07/24/2020  [name] was observed post Covid-19 immunization for 15 minutes without incident. He was provided with Vaccine Information Sheet and instruction to access the V-Safe system.   [name] was instructed to call 911 with any severe reactions post vaccine: Marland Kitchen Difficulty breathing  . Swelling of face and throat  . A fast heartbeat  . A bad rash all over body  . Dizziness and weakness

## 2020-08-11 ENCOUNTER — Other Ambulatory Visit: Payer: Self-pay | Admitting: Family Medicine

## 2020-08-13 NOTE — Telephone Encounter (Signed)
Start prior authorization for Januvia 100 mg daily for patient.  Patient was previously on glipizide in 2017 before it was switched to Prandin.  Patient was also on Actos previously which caused edema.  See if there are any coupons for Januvia.

## 2020-08-14 ENCOUNTER — Telehealth: Payer: Self-pay

## 2020-08-14 NOTE — Telephone Encounter (Signed)
Pt Rx for Januvia 100 mg was called in for refill at his pharmacy, state that pt will be notified to pick up Rx.

## 2020-08-17 NOTE — Telephone Encounter (Signed)
Pt Januvia did not require Prior Authorization, spoke with pt Insurance and pharmacy state that pt can pick up Rx at the pharmacy

## 2020-09-18 ENCOUNTER — Ambulatory Visit (INDEPENDENT_AMBULATORY_CARE_PROVIDER_SITE_OTHER): Payer: PPO | Admitting: Family Medicine

## 2020-09-18 ENCOUNTER — Encounter: Payer: Self-pay | Admitting: Family Medicine

## 2020-09-18 ENCOUNTER — Other Ambulatory Visit: Payer: Self-pay

## 2020-09-18 VITALS — BP 110/80 | HR 67 | Temp 98.2°F | Wt 236.7 lb

## 2020-09-18 DIAGNOSIS — E1149 Type 2 diabetes mellitus with other diabetic neurological complication: Secondary | ICD-10-CM | POA: Diagnosis not present

## 2020-09-18 LAB — POCT GLYCOSYLATED HEMOGLOBIN (HGB A1C): Hemoglobin A1C: 7.3 % — AB (ref 4.0–5.6)
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
## 2020-10-01 ENCOUNTER — Telehealth: Payer: Self-pay | Admitting: Pharmacist

## 2020-10-02 ENCOUNTER — Encounter: Payer: Self-pay | Admitting: Family Medicine

## 2020-10-22 ENCOUNTER — Other Ambulatory Visit: Payer: Self-pay

## 2020-10-23 ENCOUNTER — Ambulatory Visit (INDEPENDENT_AMBULATORY_CARE_PROVIDER_SITE_OTHER): Payer: HMO | Admitting: Family Medicine

## 2020-10-23 ENCOUNTER — Encounter: Payer: Self-pay | Admitting: Family Medicine

## 2020-10-23 VITALS — BP 110/80 | HR 77 | Temp 97.8°F | Wt 244.6 lb

## 2020-10-23 DIAGNOSIS — E1149 Type 2 diabetes mellitus with other diabetic neurological complication: Secondary | ICD-10-CM | POA: Diagnosis not present

## 2020-10-23 DIAGNOSIS — I10 Essential (primary) hypertension: Secondary | ICD-10-CM
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
## 2020-10-23 NOTE — Patient Instructions (Addendum)
Look for glucose tabs at your local pharmacy.  These are tablets that you can take for hypoglycemia (when you are blood sugar is low).  The glucose test will increase your blood sugar by specific amount to avoid hyperglycemia.  They can be found over-the-counter at your local pharmacy.  Our pharmacist is not here in office today, but we will try to figure out a way to get samples of Januvia or switch to a different medication to help with your blood sugar.  Continue decreasing the amount of carbohydrates and sweets you are eating as well as increasing the physical activity to lose weight to help improve your blood sugar.  Continue checking your blood sugars regularly and keeping a log to bring with you to clinic we will have you follow-up in the next 1-2 months, sooner if needed.Glucose chewable tablets What is this medicine? GLUCOSE (gloo kohs) is used to treat low blood sugar. This medicine may be used for other purposes; ask your health care provider or pharmacist if you have questions. COMMON BRAND NAME(S): BD Glucose, Dex4 Glucose What should I tell my health care provider before I take this medicine? They need to know if you have any of these conditions:  difficulty swallowing  an unusual or allergic reaction to glucose, other medicines, foods, dyes, or preservatives  pregnant or trying to get pregnant  breast-feeding How should I use this medicine? Take this medicine by mouth with a glass of water. Chew it completely before swallowing. Follow the directions on the package label or follow the advice of your health care professional. Do not take your medicine more often than directed. Talk to your pediatrician regarding the use of this medicine in children. Special care may be needed. Overdosage: If you think you have taken too much of this medicine contact a poison control center or emergency room at once. NOTE: This medicine is only for you. Do not share this medicine with others. What  if I miss a dose? This does not apply; this medicine is not for regular use. What may interact with this medicine? Interactions are not expected. This list may not describe all possible interactions. Give your health care provider a list of all the medicines, herbs, non-prescription drugs, or dietary supplements you use. Also tell them if you smoke, drink alcohol, or use illegal drugs. Some items may interact with your medicine. What should I watch for while using this medicine? Visit your health care professional or doctor for regular checks on your progress. Learn how to check your blood sugar. Learn the symptoms of low and high blood sugar and how to manage them. Make sure others know that you can choke if you eat or drink when you develop serious symptoms of low blood sugar, such as seizures or unconsciousness. They must get medical help at once. Wear a medical ID bracelet or chain, and carry a card that describes your disease and details of your medicine and dosage times. What side effects may I notice from receiving this medicine? Side effects that you should report to your doctor or health care professional as soon as possible:  allergic reactions like skin rash, itching or hives, swelling of the face, lips, or tongue Side effects that usually do not require medical attention (report to your doctor or health care professional if they continue or are bothersome):  increased blood sugar This list may not describe all possible side effects. Call your doctor for medical advice about side effects. You may report side  effects to FDA at 1-800-FDA-1088. Where should I keep my medicine? Keep out of the reach of children. Store in a cool, dry place at room temperature. Throw away any unused medicine after the expiration date. NOTE: This sheet is a summary. It may not cover all possible information. If you have questions about this medicine, talk to your doctor, pharmacist, or health care  provider.  2020 Elsevier/Gold Standard (2015-11-05 08:37:44)  Diabetes Mellitus and Nutrition, Adult When you have diabetes (diabetes mellitus), it is very important to have healthy eating habits because your blood sugar (glucose) levels are greatly affected by what you eat and drink. Eating healthy foods in the appropriate amounts, at about the same times every day, can help you:  Control your blood glucose.  Lower your risk of heart disease.  Improve your blood pressure.  Reach or maintain a healthy weight. Every person with diabetes is different, and each person has different needs for a meal plan. Your health care provider may recommend that you work with a diet and nutrition specialist (dietitian) to make a meal plan that is best for you. Your meal plan may vary depending on factors such as:  The calories you need.  The medicines you take.  Your weight.  Your blood glucose, blood pressure, and cholesterol levels.  Your activity level.  Other health conditions you have, such as heart or kidney disease. How do carbohydrates affect me? Carbohydrates, also called carbs, affect your blood glucose level more than any other type of food. Eating carbs naturally raises the amount of glucose in your blood. Carb counting is a method for keeping track of how many carbs you eat. Counting carbs is important to keep your blood glucose at a healthy level, especially if you use insulin or take certain oral diabetes medicines. It is important to know how many carbs you can safely have in each meal. This is different for every person. Your dietitian can help you calculate how many carbs you should have at each meal and for each snack. Foods that contain carbs include:  Bread, cereal, rice, pasta, and crackers.  Potatoes and corn.  Peas, beans, and lentils.  Milk and yogurt.  Fruit and juice.  Desserts, such as cakes, cookies, ice cream, and candy. How does alcohol affect me? Alcohol can  cause a sudden decrease in blood glucose (hypoglycemia), especially if you use insulin or take certain oral diabetes medicines. Hypoglycemia can be a life-threatening condition. Symptoms of hypoglycemia (sleepiness, dizziness, and confusion) are similar to symptoms of having too much alcohol. If your health care provider says that alcohol is safe for you, follow these guidelines:  Limit alcohol intake to no more than 1 drink per day for nonpregnant women and 2 drinks per day for men. One drink equals 12 oz of beer, 5 oz of wine, or 1 oz of hard liquor.  Do not drink on an empty stomach.  Keep yourself hydrated with water, diet soda, or unsweetened iced tea.  Keep in mind that regular soda, juice, and other mixers may contain a lot of sugar and must be counted as carbs. What are tips for following this plan?  Reading food labels  Start by checking the serving size on the "Nutrition Facts" label of packaged foods and drinks. The amount of calories, carbs, fats, and other nutrients listed on the label is based on one serving of the item. Many items contain more than one serving per package.  Check the total grams (g) of carbs  in one serving. You can calculate the number of servings of carbs in one serving by dividing the total carbs by 15. For example, if a food has 30 g of total carbs, it would be equal to 2 servings of carbs.  Check the number of grams (g) of saturated and trans fats in one serving. Choose foods that have low or no amount of these fats.  Check the number of milligrams (mg) of salt (sodium) in one serving. Most people should limit total sodium intake to less than 2,300 mg per day.  Always check the nutrition information of foods labeled as "low-fat" or "nonfat". These foods may be higher in added sugar or refined carbs and should be avoided.  Talk to your dietitian to identify your daily goals for nutrients listed on the label. Shopping  Avoid buying canned, premade, or  processed foods. These foods tend to be high in fat, sodium, and added sugar.  Shop around the outside edge of the grocery store. This includes fresh fruits and vegetables, bulk grains, fresh meats, and fresh dairy. Cooking  Use low-heat cooking methods, such as baking, instead of high-heat cooking methods like deep frying.  Cook using healthy oils, such as olive, canola, or sunflower oil.  Avoid cooking with butter, cream, or high-fat meats. Meal planning  Eat meals and snacks regularly, preferably at the same times every day. Avoid going long periods of time without eating.  Eat foods high in fiber, such as fresh fruits, vegetables, beans, and whole grains. Talk to your dietitian about how many servings of carbs you can eat at each meal.  Eat 4-6 ounces (oz) of lean protein each day, such as lean meat, chicken, fish, eggs, or tofu. One oz of lean protein is equal to: ? 1 oz of meat, chicken, or fish. ? 1 egg. ?  cup of tofu.  Eat some foods each day that contain healthy fats, such as avocado, nuts, seeds, and fish. Lifestyle  Check your blood glucose regularly.  Exercise regularly as told by your health care provider. This may include: ? 150 minutes of moderate-intensity or vigorous-intensity exercise each week. This could be brisk walking, biking, or water aerobics. ? Stretching and doing strength exercises, such as yoga or weightlifting, at least 2 times a week.  Take medicines as told by your health care provider.  Do not use any products that contain nicotine or tobacco, such as cigarettes and e-cigarettes. If you need help quitting, ask your health care provider.  Work with a Social worker or diabetes educator to identify strategies to manage stress and any emotional and social challenges. Questions to ask a health care provider  Do I need to meet with a diabetes educator?  Do I need to meet with a dietitian?  What number can I call if I have questions?  When are the  best times to check my blood glucose? Where to find more information:  American Diabetes Association: diabetes.org  Academy of Nutrition and Dietetics: www.eatright.[employer] of Diabetes and Digestive and Kidney Diseases (NIH): [employer] Summary  A healthy meal plan will help you control your blood glucose and maintain a healthy lifestyle.  Working with a diet and nutrition specialist (dietitian) can help you make a meal plan that is best for you.  Keep in mind that carbohydrates (carbs) and alcohol have immediate effects on your blood glucose levels. It is important to count carbs and to use alcohol carefully. This information is not intended to replace advice  given to you by your health care provider. Make sure you discuss any questions you have with your health care provider. Document Revised: 09/15/2017 Document Reviewed: 11/07/2016 Elsevier Patient Education  [place].  Hypoglycemia Hypoglycemia is when the sugar (glucose) level in your blood is too low. Signs of low blood sugar may include:  Feeling: ? Hungry. ? Worried or nervous (anxious). ? Sweaty and clammy. ? Confused. ? Dizzy. ? Sleepy. ? Sick to your stomach (nauseous).  Having: ? A fast heartbeat. ? A headache. ? A change in your vision. ? Tingling or no feeling (numbness) around your mouth, lips, or tongue. ? Jerky movements that you cannot control (seizure).  Having trouble with: ? Moving (coordination). ? Sleeping. ? Passing out (fainting). ? Getting upset easily (irritability). Low blood sugar can happen to people who have diabetes and people who do not have diabetes. Low blood sugar can happen quickly, and it can be an emergency. Treating low blood sugar Low blood sugar is often treated by eating or drinking something sugary right away, such as:  Fruit juice, 4-6 oz (120-150 mL).  Regular soda (not diet soda), 4-6 oz (120-150 mL).  Low-fat milk, 4 oz (120  mL).  Several pieces of hard candy.  Sugar or honey, 1 Tbsp (15 mL). Treating low blood sugar if you have diabetes If you can think clearly and swallow safely, follow the 15:15 rule:  Take 15 grams of a fast-acting carb (carbohydrate). Talk with your doctor about how much you should take.  Always keep a source of fast-acting carb with you, such as: ? Sugar tablets (glucose pills). Take 3-4 pills. ? 6-8 pieces of hard candy. ? 4-6 oz (120-150 mL) of fruit juice. ? 4-6 oz (120-150 mL) of regular (not diet) soda. ? 1 Tbsp (15 mL) honey or sugar.  Check your blood sugar 15 minutes after you take the carb.  If your blood sugar is still at or below 70 mg/dL (3.9 mmol/L), take 15 grams of a carb again.  If your blood sugar does not go above 70 mg/dL (3.9 mmol/L) after 3 tries, get help right away.  After your blood sugar goes back to normal, eat a meal or a snack within 1 hour.  Treating very low blood sugar If your blood sugar is at or below 54 mg/dL (3 mmol/L), you have very low blood sugar (severe hypoglycemia). This may also cause:  Passing out.  Jerky movements you cannot control (seizure).  Losing consciousness (coma). This is an emergency. Do not wait to see if the symptoms will go away. Get medical help right away. Call your local emergency services (911 in the [place] Do not drive yourself to the hospital. If you have very low blood sugar and you cannot eat or drink, you may need a glucagon shot (injection). A family member or friend should learn how to check your blood sugar and how to give you a glucagon shot. Ask your doctor if you need to have a glucagon shot kit at home. Follow these instructions at home: General instructions  Take over-the-counter and prescription medicines only as told by your doctor.  Stay aware of your blood sugar as told by your doctor.  Limit alcohol intake to no more than 1 drink a day for nonpregnant women and 2 drinks a day for men. One drink  equals 12 oz of beer (355 mL), 5 oz of wine (148 mL), or 1 oz of hard liquor (44 mL).  Keep all follow-up  visits as told by your doctor. This is important. If you have diabetes:   Follow your diabetes care plan as told by your doctor. Make sure you: ? Know the signs of low blood sugar. ? Take your medicines as told. ? Follow your exercise and meal plan. ? Eat on time. Do not skip meals. ? Check your blood sugar as often as told by your doctor. Always check it before and after exercise. ? Follow your sick day plan when you cannot eat or drink normally. Make this plan ahead of time with your doctor.  Share your diabetes care plan with: ? Your work or school. ? People you live with.  Check your pee (urine) for ketones: ? When you are sick. ? As told by your doctor.  Carry a card or wear jewelry that says you have diabetes. Contact a doctor if:  You have trouble keeping your blood sugar in your target range.  You have low blood sugar often. Get help right away if:  You still have symptoms after you eat or drink something sugary.  Your blood sugar is at or below 54 mg/dL (3 mmol/L).  You have jerky movements that you cannot control.  You pass out. These symptoms may be an emergency. Do not wait to see if the symptoms will go away. Get medical help right away. Call your local emergency services (911 in the [place] Do not drive yourself to the hospital. Summary  Hypoglycemia happens when the level of sugar (glucose) in your blood is too low.  Low blood sugar can happen to people who have diabetes and people who do not have diabetes. Low blood sugar can happen quickly, and it can be an emergency.  Make sure you know the signs of low blood sugar and know how to treat it.  Always keep a source of sugar (fast-acting carb) with you to treat low blood sugar. This information is not intended to replace advice given to you by your health care provider. Make sure you discuss any  questions you have with your health care provider. Document Revised: 01/24/2019 Document Reviewed: 11/06/2015 Elsevier Patient Education  2020 [employer].

## 2020-11-05 ENCOUNTER — Telehealth: Payer: Self-pay | Admitting: Family Medicine

## 2020-11-05 NOTE — Telephone Encounter (Signed)
The patient wanted to know if Dr bank could give him samples of sitaGLIPtin (JANUVIA) 100 MG tablet. If not, would like an Rx called in [place], [place]  Phone:  [phone] Fax:  [phone]

## 2020-11-06 ENCOUNTER — Other Ambulatory Visit: Payer: Self-pay

## 2020-11-06 MED ORDER — SITAGLIPTIN PHOSPHATE 100 MG PO TABS: 100.0000 mg | ORAL_TABLET | Freq: Every day | ORAL | 3 refills | Status: DC

## 2020-11-06 NOTE — Telephone Encounter (Signed)
Patient's wife Hoyle Sauer is calling to see if Dr. Volanda Napoleon found any samples for Januvia.  Please advise.

## 2020-11-06 NOTE — Telephone Encounter (Signed)
Pt Rx for Januvia sent to pt pharmacy per pt request

## 2020-11-18 ENCOUNTER — Ambulatory Visit: Payer: HMO | Admitting: Orthopaedic Surgery

## 2020-11-27 ENCOUNTER — Other Ambulatory Visit: Payer: Self-pay | Admitting: Family Medicine

## 2020-11-27 DIAGNOSIS — I10 Essential (primary) hypertension: Secondary | ICD-10-CM

## 2020-12-01 ENCOUNTER — Telehealth: Payer: Self-pay

## 2020-12-01 NOTE — Telephone Encounter (Signed)
Caryl Pina is calling in needing a verbal diagnosis of diabetes or chronic heart failure in order to continue having patient on their policy. Tried faxing something over but kept failing.

## 2020-12-02 NOTE — Telephone Encounter (Signed)
Pt form received placed on Dr Volanda Napoleon desk for completing

## 2020-12-03 ENCOUNTER — Telehealth: Payer: Self-pay | Admitting: Pharmacist

## 2020-12-04 ENCOUNTER — Telehealth: Payer: Self-pay | Admitting: Family Medicine

## 2020-12-04 NOTE — Telephone Encounter (Signed)
Caryl Pina from [facility] is calling to get a verbal diagnosis of diabetes or chronic heart failure.  She states this information is needed before 12/14/20 to avoid the patient being un-enrolled.  Please advise.

## 2020-12-07 NOTE — Chronic Care Management (AMB) (Signed)
Error

## 2020-12-08 NOTE — Telephone Encounter (Signed)
[name] w/[employer] insurance is calling in stating that they need a call back Thursday 12/10/2020 to let them know if the pt is diagnosis with diabetes or congestive heart failure due to the pt is enrolled in a program that they have.  She stated if they do not receive a call back by Thursday on Monday 12/14/2020 the pt will be un-enrolled in the program.  [name] would like to be called back at [phone].

## 2020-12-08 NOTE — Telephone Encounter (Signed)
[employer] provided the correct fax number and pt form faxed with a confirmation

## 2020-12-08 NOTE — Telephone Encounter (Signed)
Pt form faxed to [employer], confirmation received

## 2020-12-09 ENCOUNTER — Telehealth: Payer: Self-pay

## 2020-12-09 ENCOUNTER — Encounter: Payer: Self-pay | Admitting: Orthopaedic Surgery

## 2020-12-09 ENCOUNTER — Ambulatory Visit (INDEPENDENT_AMBULATORY_CARE_PROVIDER_SITE_OTHER): Payer: HMO

## 2020-12-09 ENCOUNTER — Ambulatory Visit (INDEPENDENT_AMBULATORY_CARE_PROVIDER_SITE_OTHER): Payer: HMO | Admitting: Orthopaedic Surgery

## 2020-12-09 VITALS — Ht 70.5 in | Wt 239.8 lb

## 2020-12-09 DIAGNOSIS — G8929 Other chronic pain: Secondary | ICD-10-CM | POA: Diagnosis not present

## 2020-12-09 DIAGNOSIS — M1711 Unilateral primary osteoarthritis, right knee: Secondary | ICD-10-CM

## 2020-12-09 DIAGNOSIS — M25561 Pain in right knee: Secondary | ICD-10-CM

## 2020-12-09 DIAGNOSIS — M25562 Pain in left knee: Secondary | ICD-10-CM

## 2020-12-09 DIAGNOSIS — M1712 Unilateral primary osteoarthritis, left knee: Secondary | ICD-10-CM | POA: Diagnosis not present

## 2020-12-09 NOTE — Telephone Encounter (Signed)
Bilateral knee gel injections
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
## 2020-12-10 NOTE — Telephone Encounter (Signed)
Noted  

## 2020-12-14 ENCOUNTER — Other Ambulatory Visit: Payer: Self-pay | Admitting: Family Medicine

## 2020-12-14 DIAGNOSIS — I10 Essential (primary) hypertension: Secondary | ICD-10-CM

## 2020-12-15 ENCOUNTER — Other Ambulatory Visit: Payer: Self-pay | Admitting: Family Medicine

## 2020-12-16 ENCOUNTER — Telehealth: Payer: Self-pay | Admitting: Pharmacist

## 2020-12-16 NOTE — Chronic Care Management (AMB) (Signed)
Chronic Care Management Pharmacy Assistant   Name: John Hobbs  MRN: [id number] DOB: [birth year]  Reason for Encounter: General Adherence Call  Patient Questions: 1.  Have you seen any other providers since your last visit?  o 02.23.22 Consult Visit Mcarthur Rossetti, MD Orthopedic Surgery o 01.07.22 Office Visit Billie Ruddy, MD Family Medicine  2.  Any changes in your medicines or health? No   PCP : Billie Ruddy, MD Allergies:  No Known Allergies  Medications: Outpatient Encounter Medications as of 12/16/2020  Medication Sig  . amLODipine (NORVASC) 5 MG tablet TAKE 1 TABLET BY MOUTH DAILY  . aspirin EC 81 MG tablet Take 81 mg by mouth daily after breakfast.  . atenolol-chlorthalidone (TENORETIC) 100-25 MG tablet TAKE 1/2 TABLET BY MOUTH DAILY  . atorvastatin (LIPITOR) 20 MG tablet TAKE 1 TABLET BY MOUTH EVERY DAY  . canagliflozin (INVOKANA) 300 MG TABS tablet Take 1 tablet (300 mg total) by mouth daily before breakfast.  . glucose blood test strip Use as instructed for checking fsbs TID.  Marland Kitchen Lancets (ONETOUCH ULTRASOFT) lancets Use to check blood sugar once daily  . loratadine (CLARITIN) 10 MG tablet TAKE 1 TABLET(10 MG) BY MOUTH DAILY  . losartan (COZAAR) 50 MG tablet TAKE 1 TABLET(50 MG) BY MOUTH DAILY  . metFORMIN (GLUCOPHAGE) 1000 MG tablet Take 1 tablet (1,000 mg total) by mouth 2 (two) times daily with a meal.  . ONE TOUCH LANCETS MISC Test once day  . ONETOUCH VERIO test strip USE TO CHECK BLOOD SUGAR ONCE DAILY  . potassium chloride SA (KLOR-CON) 20 MEQ tablet TAKE 1/2 TABLET(10 MEQ) BY MOUTH DAILY  . repaglinide (PRANDIN) 2 MG tablet Take 2 tablets (4 mg total) by mouth daily before breakfast AND 2 tablets (4 mg total) daily before lunch AND 0.5 tablets (1 mg total) daily before supper. Take 2 tablets by Mouth three times daily before meals . (Patient taking differently: Take 2 tablets (4 mg total) by mouth daily before breakfast AND 2 tablets (4 mg  total) daily before lunch AND 1 tablet (2 mg total) daily before supper. Tak)  . sitaGLIPtin (JANUVIA) 100 MG tablet Take 1 tablet (100 mg total) by mouth daily.  Marland Kitchen triamcinolone (NASACORT) 55 MCG/ACT AERO nasal inhaler Place 2 sprays into the nose daily as needed (congestion).  . Vitamin D, Ergocalciferol, (DRISDOL) 1.25 MG (50000 UT) CAPS capsule Take 1 capsule (50,000 Units total) by mouth every 7 (seven) days.   No facility-administered encounter medications on file as of 12/16/2020.    Current Diagnosis: Patient Active Problem List   Diagnosis Date Noted  . Allergic rhinitis   . Type 2 diabetes mellitus with hyperglycemia, without long-term current use of insulin ([place]) 10/10/2019  . Unilateral primary osteoarthritis, right knee 05/13/2019  . Status post arthroscopy of right knee 12/19/2018  . Chronic left shoulder pain 03/13/2018  . Impingement syndrome of left shoulder 03/13/2018  . Morbid obesity ([place]) 09/21/2017  . Hyperlipidemia associated with type 2 diabetes mellitus ([place]) 01/24/2017  . Acute medial meniscal tear 08/10/2015  . NASH (nonalcoholic steatohepatitis) 07/28/2015  . Type 2 diabetes mellitus with neurological manifestations, uncontrolled ([place]) 12/30/2013  . Hypertension associated with diabetes ([place]) 07/02/2008    Goals Addressed   None     Follow-Up:  Pharmacist Review  I spoke with the patient and discussed medication adherence, no issues at this time with current medication. I informed him that his patient assistance application for Canagliflozin [facility]) was rejected. He  understood. He was asked to bring his and his wife's proof of income to his next appointment with his primary care doctor appointment. He understood and would do so. He has continued to take her medication as written. He has been working on healthy eating habits. He denies ED visits since his last CPP follow-up. The patient denies any side effects with his medication. Also, denies any problems  with his current pharmacy.  Maia Breslow, [place] Assistant [phone]

## 2020-12-17 ENCOUNTER — Telehealth: Payer: Self-pay

## 2020-12-17 NOTE — Telephone Encounter (Signed)
Submitted VOB for Monovisc, bilateral knee.  Pending BV.

## 2020-12-21 ENCOUNTER — Ambulatory Visit (INDEPENDENT_AMBULATORY_CARE_PROVIDER_SITE_OTHER): Payer: HMO | Admitting: Family Medicine

## 2020-12-21 ENCOUNTER — Encounter: Payer: Self-pay | Admitting: Family Medicine

## 2020-12-21 ENCOUNTER — Other Ambulatory Visit: Payer: Self-pay

## 2020-12-21 VITALS — BP 132/82 | HR 95 | Temp 98.0°F | Wt 244.6 lb

## 2020-12-21 DIAGNOSIS — I10 Essential (primary) hypertension: Secondary | ICD-10-CM | POA: Diagnosis not present

## 2020-12-21 DIAGNOSIS — E1149 Type 2 diabetes mellitus with other diabetic neurological complication: Secondary | ICD-10-CM | POA: Diagnosis not present

## 2020-12-21 DIAGNOSIS — M25561 Pain in right knee: Secondary | ICD-10-CM

## 2020-12-21 DIAGNOSIS — M25562 Pain in left knee: Secondary | ICD-10-CM

## 2020-12-21 LAB — MICROALBUMIN / CREATININE URINE RATIO
Creatinine,U: 148.5 mg/dL
Microalb Creat Ratio: 1.3 mg/g (ref 0.0–30.0)
Microalb, Ur: 1.9 mg/dL (ref 0.0–1.9)
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
## 2020-12-30 ENCOUNTER — Other Ambulatory Visit: Payer: Self-pay | Admitting: Physician Assistant

## 2020-12-30 ENCOUNTER — Telehealth: Payer: Self-pay

## 2020-12-30 MED ORDER — TRAMADOL HCL 50 MG PO TABS: 50.0000 mg | ORAL_TABLET | Freq: Four times a day (QID) | ORAL | 0 refills | Status: DC | PRN

## 2020-12-30 NOTE — Telephone Encounter (Signed)
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

## 2020-12-30 NOTE — Telephone Encounter (Signed)
Pt would like some pain medication called in for the pain till his gel injection comes in

## 2021-01-05 NOTE — Progress Notes (Signed)
Spoke with patients wife, Hoyle Sauer, and gave results.

## 2021-01-07 ENCOUNTER — Other Ambulatory Visit: Payer: Self-pay | Admitting: Internal Medicine

## 2021-01-07 ENCOUNTER — Other Ambulatory Visit: Payer: Self-pay | Admitting: Family Medicine

## 2021-01-27 ENCOUNTER — Telehealth: Payer: Self-pay

## 2021-01-27 NOTE — Telephone Encounter (Signed)
Approved, for Monovisc, bilateral knee. [place] Patient will be responsible for 20% OOP. Co-pay of $20.00 No PA required

## 2021-02-03 DIAGNOSIS — H25813 Combined forms of age-related cataract, bilateral: Secondary | ICD-10-CM | POA: Diagnosis not present

## 2021-02-03 DIAGNOSIS — H40013 Open angle with borderline findings, low risk, bilateral: Secondary | ICD-10-CM | POA: Diagnosis not present

## 2021-02-03 DIAGNOSIS — E119 Type 2 diabetes mellitus without complications: Secondary | ICD-10-CM | POA: Diagnosis not present

## 2021-02-08 ENCOUNTER — Ambulatory Visit (INDEPENDENT_AMBULATORY_CARE_PROVIDER_SITE_OTHER): Payer: HMO | Admitting: Orthopaedic Surgery

## 2021-02-08 ENCOUNTER — Encounter: Payer: Self-pay | Admitting: Orthopaedic Surgery

## 2021-02-08 ENCOUNTER — Other Ambulatory Visit: Payer: Self-pay | Admitting: Internal Medicine

## 2021-02-08 DIAGNOSIS — M1711 Unilateral primary osteoarthritis, right knee: Secondary | ICD-10-CM

## 2021-02-08 DIAGNOSIS — M1712 Unilateral primary osteoarthritis, left knee: Secondary | ICD-10-CM | POA: Diagnosis not present

## 2021-02-08 MED ORDER — HYALURONAN 88 MG/4ML IX SOSY
88.0000 mg | PREFILLED_SYRINGE | INTRA_ARTICULAR | Status: AC | PRN
  Administered 2021-02-08: 88 mg via INTRA_ARTICULAR

## 2021-02-08 MED ORDER — HYALURONAN 88 MG/4ML IX SOSY
88.0000 mg | PREFILLED_SYRINGE | INTRA_ARTICULAR | Status: AC | PRN
Start: 2021-02-08 — End: 2021-02-08
  Administered 2021-02-08: 88 mg via INTRA_ARTICULAR

## 2021-02-08 NOTE — Progress Notes (Signed)
   Procedure Note  Patient: John Hobbs             Date of Birth: 18-Jun-1946           MRN: [id number]             Visit Date: 02/08/2021  Procedures: Visit Diagnoses:  1. Unilateral primary osteoarthritis, left knee   2. Unilateral primary osteoarthritis, right knee     Large Joint Inj: R knee on 02/08/2021 8:48 AM Indications: diagnostic evaluation and pain Details: 22 G 1.5 in needle, superolateral approach  Arthrogram: No  Medications: 88 mg Hyaluronan 88 MG/4ML Outcome: tolerated well, no immediate complications Procedure, treatment alternatives, risks and benefits explained, specific risks discussed. Consent was given by the patient. Immediately prior to procedure a time out was called to verify the correct patient, procedure, equipment, support staff and site/side marked as required. Patient was prepped and draped in the usual sterile fashion.   Large Joint Inj: L knee on 02/08/2021 8:49 AM Indications: diagnostic evaluation and pain Details: 22 G 1.5 in needle, superolateral approach  Arthrogram: No  Medications: 88 mg Hyaluronan 88 MG/4ML Outcome: tolerated well, no immediate complications Procedure, treatment alternatives, risks and benefits explained, specific risks discussed. Consent was given by the patient. Immediately prior to procedure a time out was called to verify the correct patient, procedure, equipment, support staff and site/side marked as required. Patient was prepped and draped in the usual sterile fashion.    The patient comes in today for bilateral knee hyaluronic acid injections with Monovisc.  He has tried and failed other conservative treatment measures.  He is not a candidate anymore for steroid injections because they have not helped and he is a diabetic.  Both knees have varus malalignment hurt on a daily basis.  Hyaluronic acid is helped in the past.  He has had no other acute change in medical status.  Both knees also have varus  malalignment.  I did place Monovisc in both knees without difficulty today.  All questions and concerns were answered and addressed.  Follow-up can be as needed.

## 2021-02-15 ENCOUNTER — Telehealth: Payer: Self-pay | Admitting: Family Medicine

## 2021-02-15 ENCOUNTER — Other Ambulatory Visit: Payer: Self-pay | Admitting: Family Medicine

## 2021-02-15 NOTE — Telephone Encounter (Signed)
Patient is calling and stated that he was taking repaglinide (PRANDIN) 2 MG tablet and that provider that will no longer refill medication and patient wanted to see if Dr. Volanda Napoleon will refill medication. Pt uses  [id number] [address], [place]  Phone:  [phone] Fax:  [phone]  CB is [phone]

## 2021-02-16 ENCOUNTER — Other Ambulatory Visit: Payer: Self-pay

## 2021-02-17 ENCOUNTER — Ambulatory Visit (INDEPENDENT_AMBULATORY_CARE_PROVIDER_SITE_OTHER): Payer: HMO | Admitting: Family Medicine

## 2021-02-17 ENCOUNTER — Encounter: Payer: Self-pay | Admitting: Family Medicine

## 2021-02-17 VITALS — BP 122/78 | HR 119 | Temp 98.1°F | Wt 244.6 lb

## 2021-02-17 DIAGNOSIS — B070 Plantar wart: Secondary | ICD-10-CM

## 2021-02-17 DIAGNOSIS — K029 Dental caries, unspecified: Secondary | ICD-10-CM

## 2021-02-17 DIAGNOSIS — L089 Local infection of the skin and subcutaneous tissue, unspecified: Secondary | ICD-10-CM | POA: Diagnosis not present

## 2021-02-17 DIAGNOSIS — E1165 Type 2 diabetes mellitus with hyperglycemia: Secondary | ICD-10-CM | POA: Diagnosis not present

## 2021-02-17 DIAGNOSIS — I10 Essential (primary) hypertension: Secondary | ICD-10-CM | POA: Diagnosis not present

## 2021-02-17 MED ORDER — AMOXICILLIN 500 MG PO TABS: 500.0000 mg | ORAL_TABLET | Freq: Two times a day (BID) | ORAL | 0 refills | Status: AC

## 2021-02-17 MED ORDER — REPAGLINIDE 2 MG PO TABS: ORAL_TABLET | ORAL | 6 refills | Status: DC

## 2021-02-17 MED ORDER — METFORMIN HCL 1000 MG PO TABS: 1000.0000 mg | ORAL_TABLET | Freq: Two times a day (BID) | ORAL | 3 refills | Status: DC
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
## 2021-02-17 NOTE — Patient Instructions (Addendum)
Hyperglycemia Hyperglycemia occurs when the level of sugar (glucose) in the blood is too high. Glucose is a type of sugar that provides the body's main source of energy. Certain hormones (insulin and glucagon) control the level of glucose in the blood. Insulin lowers blood glucose, and glucagon increases blood glucose. Hyperglycemia can result from not having enough insulin in the bloodstream, or from the body not responding normally to insulin. Hyperglycemia occurs most often in people who have diabetes (diabetes mellitus), but it can happen in people who do not have diabetes. It can develop quickly, and it can be life-threatening if it causes you to become severely dehydrated (diabetic ketoacidosis or hyperglycemic hyperosmolar state). Severe hyperglycemia is a medical emergency. For most people with diabetes, a blood glucose level above 240 mg/dL is considered hyperglycemia. What are the causes? If you have diabetes, hyperglycemia may be caused by:  Medicines that increase blood glucose or affect your diabetes control.  Getting less physical activity.  Eating more than planned.  Being sick or injured, having an infection, or having surgery.  Stress.  Not giving yourself enough insulin (if you are taking insulin). If you have undiagnosed diabetes, this may be the reason you have hyperglycemia. If you do not have diabetes, hyperglycemia may be caused by:  Certain medicines, including: ? Steroid medicines. ? Beta-blockers. ? Epinephrine. ? Thiazide diuretics.  Stress.  Having a serious illness, an infection, or surgery.  Diseases of the pancreas. What increases the risk? Hyperglycemia is more likely to develop in people who have risk factors for diabetes, such as:  Having a family member with diabetes.  Certain conditions in which the body's disease-fighting system (immune system) attacks itself (autoimmune disorders).  Being overweight or obese.  Having an inactive  (sedentary) lifestyle.  Having been diagnosed with insulin resistance.  Having a history of prediabetes, gestational diabetes, or polycystic ovarian syndrome (PCOS). What are the signs or symptoms? Hyperglycemia may not cause any symptoms. If you do have symptoms, they may include:  Increased thirst.  Needing to urinate more often than usual.  Hunger.  Feeling very tired.  Blurry vision. Other symptoms may develop if hyperglycemia gets worse, such as:  Dry mouth.  Abdominal pain.  Loss of appetite.  Fruity-smelling breath.  Weakness.  Unexpected weight loss.  Tingling or numbness in the hands or feet.  Headache.  Cuts or bruises that are slow to heal. How is this diagnosed? Hyperglycemia is diagnosed with a blood test to measure your blood glucose level. This blood test is usually done while you are having symptoms. Your health care provider may also do a physical exam and review your medical history. You may have more tests to determine the cause of your hyperglycemia, such as:  A fasting blood glucose (FBG) test. You will not be allowed to eat (you will fast) for at least 8 hours before a blood sample is taken.  An A1C blood test. This provides information about blood glucose control over the previous 2-3 months.  An oral glucose tolerance test (OGTT). This measures your blood glucose at two times: ? After fasting. This is your baseline blood glucose level. ? 2 hours after drinking a beverage that contains glucose. How is this treated? Treatment depends on the cause of your hyperglycemia. Treatment may include:  Taking medicine to regulate your blood glucose levels. If you take insulin or other diabetes medicines, your medicine or dosage may be adjusted.  Lifestyle changes, such as exercising more, eating healthier foods, or losing  weight.  Treating an illness or infection.  Checking your blood glucose more often.  Stopping or reducing steroid  medicines. If your hyperglycemia becomes severe and it results in diabetic ketoacidosis or hyperglycemic hyperosmolar state, you must be hospitalized and given IV fluids and IV insulin. Follow these instructions at home: General instructions  Take over-the-counter and prescription medicines only as told by your health care provider.  Do not use any products that contain nicotine or tobacco. These products include cigarettes, chewing tobacco, and vaping devices, such as e-cigarettes. If you need help quitting, ask your health care provider.  If you drink alcohol: ? Limit how much you have to:  0-1 drink a day for women who are not pregnant.  0-2 drinks a day for men. ? Know how much alcohol is in a drink. In the [place] one drink equals one 12 oz bottle of beer (355 mL), one 5 oz glass of wine (148 mL), or one 1 oz glass of hard liquor (44 mL).  Learn to manage stress. If you need help with this, ask your health care provider.  Do exercises as told by your health care provider.  Keep all follow-up visits. This is important. Eating and drinking  Maintain a healthy weight.  Stay hydrated, especially when you exercise, get sick, or spend time in hot temperatures.  Drink enough fluid to keep your urine pale yellow.   If you have diabetes:  Know the symptoms of hyperglycemia.  Follow your diabetes management plan as told by your health care provider. Make sure you: ? Take your insulin and medicines as told. ? Follow your exercise plan. ? Follow your meal plan. Eat on time, and do not skip meals. ? Check your blood glucose as often as told. Make sure to check your blood glucose before and after exercise. If you exercise longer or in a different way, check your blood glucose more often. ? Follow your sick day plan whenever you cannot eat or drink normally. Make this plan in advance with your health care provider.  Share your diabetes management plan with people in your workplace,  school, and household.  Check your urine for ketones when you are ill and as told by your health care provider.  Carry a medical alert card or wear medical alert jewelry.   Where to find more information American Diabetes Association: www.diabetes.org Contact a health care provider if:  Your blood glucose is at or above 240 mg/dL (13.3 mmol/L) for 2 days in a row.  You have problems keeping your blood glucose in your target range.  You have frequent episodes of hyperglycemia.  You have signs of illness, such as nausea, vomiting, or fever. Get help right away if:  Your blood glucose monitor reads "high" even when you are taking insulin.  You have trouble breathing.  You have a change in how you think, feel, or act (mental status).  You have nausea or vomiting that does not go away. These symptoms may represent a serious problem that is an emergency. Do not wait to see if the symptoms will go away. Get medical help right away. Call your local emergency services (911 in the [place] Do not drive yourself to the hospital. Summary  Hyperglycemia occurs when the level of sugar (glucose) in the blood is too high.  Hyperglycemia can happen with or without diabetes, and severe hyperglycemia can be life-threatening.  Hyperglycemia is diagnosed with a blood test to measure your blood glucose level. This blood test  is usually done while you are having symptoms. Your health care provider may also do a physical exam and review your medical history.  If you have diabetes, follow your diabetes management plan as told by your health care provider.  Contact your health care provider if you have problems keeping your blood glucose in your target range. This information is not intended to replace advice given to you by your health care provider. Make sure you discuss any questions you have with your health care provider. Document Revised: 07/17/2020 Document Reviewed: 07/17/2020 Elsevier Patient  Education  2021 [place].  Plantar Warts Plantar warts are small growths on the bottom of the foot (sole). Warts are caused by a type of germ (virus). Most warts are not painful, and they usually do not cause problems. Sometimes, plantar warts can cause pain when you walk. Warts often go away on their own in time. They can also spread to other areas of the body. Treatments may be done if needed. What are the causes?  Plantar warts are caused by a germ that is called human papillomavirus (HPV). ? Walking barefoot can cause exposure to the germ, especially if your feet are wet. ? Warts happen when HPV attacks a break in the skin of the foot. What increases the risk?  Being between 77-39 years of age.  Using public showers or locker rooms.  Having a weakened body defense system (immune system). What are the signs or symptoms?  Flat or slightly raised growths that have a rough surface and look like a callus.  Pain when you use your foot to support your body weight.   How is this treated? In many cases, warts do not need treatment. Without treatment, they often go away with time. If treatment is needed or wanted, options may include:  Applying medicated solutions, creams, or patches to the wart. These make the skin soft so that layers will slowly shed away.  Freezing the wart with liquid nitrogen (cryotherapy).  Burning the wart with: ? Laser treatment. ? An electrified probe (electrocautery).  Injecting a medicine (Candida antigen) into the wart to help the body's defense system fight off the wart.  Having surgery to remove the wart.  Putting duct tape over the top of the wart (occlusion). You will leave the tape in place for as long as told by your doctor. Then you will replace it with a new strip of tape. This is done until the wart goes away. Repeat treatment may be needed if you choose to remove warts. Warts sometimes go away and come back again. Follow these instructions  at home: General instructions  Apply creams or solutions only as told by your doctor. Follow these steps if your doctor tells you to do so: ? Soak your foot in warm water. ? Remove the top layer of softened skin before you apply the medicine. You can use a pumice stone to remove the skin. ? After you apply the medicine, put a bandage over the area of the wart. ? Repeat the process every day or as told by your doctor.  Do not scratch or pick at a wart.  Wash your hands after you touch a wart.  If a wart hurts, try covering it with a bandage that has a hole in the middle.  Keep all follow-up visits as told by your doctor. This is important. How is this prevented?  Wear shoes and socks. Change your socks every day.  Keep your feet clean and dry.  Check your feet often.  Do not walk barefoot in: ? Shared locker rooms. ? Shower areas. ? Swimming pools.  Avoid direct contact with warts on other people.   Contact a doctor if:  Your warts do not improve after treatment.  You have redness, swelling, or pain at the site of a wart.  You have bleeding from a wart, and the bleeding does not stop when you put light pressure on the wart.  You have diabetes and you get a wart. Summary  Warts are small growths on the skin.  When warts happen on the bottom of the foot (sole), they are called plantar warts.  In many cases, warts do not need treatment.  Apply creams or solutions only as told by your doctor.  Do not scratch or pick at a wart. Wash your hands after you touch a wart. This information is not intended to replace advice given to you by your health care provider. Make sure you discuss any questions you have with your health care provider. Document Revised: 07/12/2018 Document Reviewed: 07/12/2018 Elsevier Patient Education  2021 [place], Adult  Dental caries are spots of decay (cavities) in teeth. They are in the outer layer of your tooth (enamel).  Treat them as soon as you can. If they are not treated, they can spread decay and lead to painful infection. What are the causes? This condition is caused by acid that is produced when bacteria in your mouth break down sugary foods and liquids. What increases the risk? This condition is more likely to develop in people who:  Drink a lot of sugary liquids.  Eat a lot of sweets and carbohydrates.  Drink water that does not have fluoride.  Do not clean their teeth regularly.  Take medicines that decrease saliva. What are the signs or symptoms? Symptoms of this condition include:  White, brown, or black spots on the teeth.  Pain as the decay goes deeper into the tooth.  Swelling or bleeding in the gums. How is this treated? This condition is treated with a procedure to remove decay and to restore the tooth. Follow these instructions at home:  Take good care of your mouth and teeth. This keeps them healthy. ? Brush your teeth 2 times a day. Use toothpaste with fluoride in it. ? Floss your teeth once a day.  If your dentist prescribed an antibiotic medicine, take it as told. Do not stop taking the antibiotic even if your condition gets better.  Keep all follow-up visits as told by your dentist. This is important. This includes all cleanings. How is this prevented?  To prevent dental caries: ? Brush your teeth every morning and night. Use fluoride toothpaste. ? Floss your teeth once a day. ? Get regular dental cleanings. ? If told by your dentist:  Wash your mouth with prescription mouthwash (chlorhexidine).  Put topical fluoride on your teeth. ? Drink water with fluoride in it. ? Drink water instead of sugary drinks. ? Eat healthy meals and snacks. ? Have fluoride treatments in the dentist's office and sealants put on your teeth, if told by your dentist. Contact a doctor if:  You have symptoms of tooth decay. Summary  Dental caries are spots of decay (cavities) in  teeth. It is important to treat this as soon as you see them.  This condition is caused by an acid that comes when sugar breaks down in your mouth.  To prevent this condition, brush your teeth often and  have regular dental cleanings.  Take an antibiotic to treat an infection, if told by your dentist. Do not stop taking the antibiotic even if your condition gets better.  Have regular dental cleanings and keep all follow-up visits. This information is not intended to replace advice given to you by your health care provider. Make sure you discuss any questions you have with your health care provider. Document Revised: 09/20/2019 Document Reviewed: 09/20/2019 Elsevier Patient Education  2021 [place], Adult  A blister is a raised bubble of skin filled with liquid. Blisters often develop on skin that rubs or presses against another surface repeatedly (friction blister). Friction blisters can occur on any part of the body, but they usually form on the hands or the feet. Long-term pressure on that same area of skin can also cause the area of skin to become hardened. This hardened skin is called a callus. What are the causes? Besides friction, blisters can be caused by:  An injury, such as a burn.  An allergic reaction.  An infection.  Exposure to irritating chemicals. Friction blisters often occur in areas with a lot of heat and moisture. Friction blisters often result from:  Sports.  Repetitive activities.  Using tools and doing other activities without wearing gloves.  Shoes that are too tight or too loose. What are the signs or symptoms? A blister is often round and looks like a bump. It may hurt or feel itchy. Before a blister forms, your skin may:  Turn red.  Feel warm.  Itch.  Be painful to the touch. How is this diagnosed? A blister is diagnosed with a physical exam. How is this treated? Treatment usually involves protecting the area where the blister  has formed until your skin has healed. Treatments may include:  Using a bandage (dressing) to cover your blister.  Putting extra padding around and over your blister so that the blister does not rub on anything.  Applying antibiotic ointment. Most blisters break open, dry up, and go away on their own within 1-2 weeks. Blisters that are very painful may be drained before they break open on their own. If your blister is large or painful, your health care provider can drain it. Follow these instructions at home: Medicines  Take or apply over-the-counter and prescription medicines only as told by your health care provider.  If you were prescribed an antibiotic medicine, take or apply it as told by your health care provider. Do not stop using the antibiotic even if you start to feel better. Skin care  Do not pop your blister. This can cause infection.  Keep your blister clean and dry. This helps to prevent infection.  Before you swim or use a hot tub, cover your blister with a waterproof dressing.  Protect the area where your blister has formed as told by your health care provider.  Follow instructions from your health care provider about how to take care of your blister. Make sure you: ? Wash your hands with soap and water for at least 20 seconds before and after you change your dressing. If soap and water are not available, use hand sanitizer. ? Change your dressing as told by your health care provider. Infection signs Check your blister every day for signs of infection. Check for:  More redness, swelling, or pain.  More fluid or blood.  Warmth.  Pus or a bad smell. General instructions  If you have a blister on a foot or toe, wear different shoes  until your blister heals.  Avoid the activity that caused the blister until your blister heals.  Keep all follow-up visits as told by your health care provider. This is important. How is this prevented? Taking these steps can help  to prevent blisters that are caused by friction. Make sure you:  Wear comfortable shoes that fit well.  Always wear socks with shoes.  Wear extra socks or use tape, dressings, or pads over blister-prone areas as needed. You may also apply petroleum jelly under dressings in blister-prone areas.  Wear protective gear, such as gloves, when taking part in sports or activities that can cause blisters.  Wear loose-fitting, moisture-wicking clothes when taking part in sports or activities.  Use powders as needed to keep your feet dry. Contact a health care provider if:  You have more redness, swelling, or pain around your blister.  You have more fluid or blood coming from your blister.  Your blister feels warm to the touch.  You have pus or a bad smell coming from your blister.  You have a fever or chills.  Your blister gets better and then it gets worse. Summary  A blister is a raised bubble of skin filled with liquid. Blisters often develop on skin that rubs or presses against another surface repeatedly (friction blister).  Most blisters break open, dry up, and go away on their own within 1-2 weeks.  Keep your blister clean and dry. This helps to prevent infection.  Take steps to help prevent blisters that are caused by friction.  Contact a health care provider if you have signs of infection. This information is not intended to replace advice given to you by your health care provider. Make sure you discuss any questions you have with your health care provider. Document Revised: 08/22/2019 Document Reviewed: 08/22/2019 Elsevier Patient Education  2021 [employer].

## 2021-03-05 NOTE — Telephone Encounter (Signed)
Med was refilled at the beginning of the month.

## 2021-03-26 ENCOUNTER — Telehealth: Payer: Self-pay | Admitting: Pharmacist

## 2021-03-26 ENCOUNTER — Telehealth: Payer: Self-pay | Admitting: Family Medicine

## 2021-03-26 NOTE — Chronic Care Management (AMB) (Signed)
Chronic Care Management Pharmacy Assistant   Name: John Hobbs  MRN: [id number] DOB: [birth year]  Reason for Encounter: Disease State/ Diabetes Assessment Call.    Conditions to be addressed/monitored: DMII  Recent office visits:  02/17/21 Grier Mitts MD [facility] Medicine) - seen for type 2 diabetes with hyperglycemia without long term current use of insulin. Patient started on Amoxicillin 568m twice daily. Discontinued canagliflozin 3016m   12/21/20 ShGrier MittsD (Family Medicine) - seen for type 2 diabetes with neurological complications and other chronic conditions. No medication changes. Follow up in 3 months.   10/23/20 ShGrier MittsD (Family Medicine) - seen for type 2 diabetes with neurological complications and other chronic conditions. No medication changes. Follow up in 2 months.   09/18/20 ShGrier MittsD (Family Medicine) - seen for type 2 diabetes with neurological complications and other chronic conditions. No medication changes. Follow up in 1 month.   Recent consult visits:  02/08/21 ChJean RosenthalD (Orthopedic Surgery) - seen for unilateral primary osteoarthritis left knee. No medication changes. Follow up as needed.   12/09/20 ChJean RosenthalD (Orthopedic Surgery) - seen for unilateral primary osteoarthritis right knee. No medication changes. Follow up as needed.   Hospital visits:  None in previous 6 months  Medications: Outpatient Encounter Medications as of 03/26/2021  Medication Sig   amLODipine (NORVASC) 5 MG tablet TAKE 1 TABLET BY MOUTH DAILY   aspirin EC 81 MG tablet Take 81 mg by mouth daily after breakfast.   atenolol-chlorthalidone (TENORETIC) 100-25 MG tablet TAKE 1/2 TABLET BY MOUTH DAILY   atorvastatin (LIPITOR) 20 MG tablet TAKE 1 TABLET BY MOUTH EVERY DAY   glucose blood test strip Use as instructed for checking fsbs TID.   Lancets (ONETOUCH ULTRASOFT) lancets Use to check blood sugar once daily   loratadine (CLARITIN) 10 MG  tablet TAKE 1 TABLET(10 MG) BY MOUTH DAILY   losartan (COZAAR) 50 MG tablet TAKE 1 TABLET(50 MG) BY MOUTH DAILY   metFORMIN (GLUCOPHAGE) 1000 MG tablet Take 1 tablet (1,000 mg total) by mouth 2 (two) times daily with a meal.   ONE TOUCH LANCETS MISC Test once day   ONETOUCH VERIO test strip USE TO CHECK BLOOD SUGAR ONCE DAILY   potassium chloride SA (KLOR-CON) 20 MEQ tablet TAKE 1/2 TABLET(10 MEQ) BY MOUTH DAILY   repaglinide (PRANDIN) 2 MG tablet Take 2 tablets (4 mg total) by mouth daily before breakfast AND 2 tablets (4 mg total) daily before lunch AND 1 tablet (2 mg total) daily before supper. Tak   sitaGLIPtin (JANUVIA) 100 MG tablet Take 1 tablet (100 mg total) by mouth daily.   traMADol (ULTRAM) 50 MG tablet Take 1 tablet (50 mg total) by mouth every 6 (six) hours as needed.   triamcinolone (NASACORT) 55 MCG/ACT AERO nasal inhaler Place 2 sprays into the nose daily as needed (congestion).   Vitamin D, Ergocalciferol, (DRISDOL) 1.25 MG (50000 UT) CAPS capsule Take 1 capsule (50,000 Units total) by mouth every 7 (seven) days.   No facility-administered encounter medications on file as of 03/26/2021.    Recent Relevant Labs: Lab Results  Component Value Date/Time   HGBA1C 7.3 (A) 09/18/2020 11:41 AM   HGBA1C 7.1 (A) 07/02/2020 10:15 AM   HGBA1C 8.3 (H) 01/30/2018 08:11 AM   HGBA1C 7.5 (H) 05/04/2017 10:11 AM   MICROALBUR 1.9 12/21/2020 10:04 AM   MICROALBUR 0.7 12/22/2015 09:02 AM    Kidney Function Lab Results  Component Value Date/Time   CREATININE  1.13 04/24/2020 02:07 PM   CREATININE 1.14 09/18/2019 02:22 PM   CREATININE 1.28 09/04/2019 12:23 PM   CREATININE 1.01 10/22/2013 10:11 AM   GFR 76.63 09/18/2019 02:22 PM   GFRNONAA >60 08/17/2019 02:48 PM   GFRNONAA 78 10/22/2013 10:11 AM   GFRAA >60 08/17/2019 02:48 PM   GFRAA >89 10/22/2013 10:11 AM    Current antihyperglycemic regimen:  Metformin 1070m - take 1 tablet twice a day. Repaglinide 228m- take 2 tablet before  breakfast and lunch and 1 tablet before dinner.  Sitagliptan 10055m take once daily.  What recent interventions/DTPs have been made to improve glycemic control:  Patient taken off of canagliflozin Have there been any recent hospitalizations or ED visits since last visit with CPP? No Patient denies hypoglycemic symptoms, including None Patient reports hyperglycemic symptoms, including none How often are you checking your blood sugar? once daily What are your blood sugars ranging? Patient stated it is ranging from the 190s-200s before eating breakfast.  During the week, how often does your blood glucose drop below 70? Never Are you checking your feet daily/regularly?   Adherence Review: Is the patient currently on a STATIN medication? Yes Is the patient currently on ACE/ARB medication? Yes Does the patient have >5 day gap between last estimated fill dates? No  Notes: Spoke with patient and reviewed all medications as listed. Patient reports taking all medications as prescribed except patient has not taken aspirin in over a month and no longer takes the once a week 50000 units vitamin D. Patient states he was instructed to only take it until it rand out and now he takes a once a day mens over 50 multivitamin and has been taking cetirizine mg daily for his allergies. Patient stated he does check his blood sugar but he does not write the numbers down. I re encouraged and instructed patient to start writing the numbers down with the date and time he checks. Patient reports his sugars have been higher lately but no symptoms of hyperglycemia. Patient states that for breakfast he usually has scrambled eggs and either sausage or bacon with some toast and jelly. Patient stated he usually has either a ham or a [place] cheese sandwich for lunch. Patient stated he does eat quite a bit of red meat. Patient will usually have either chicken or pork chops with a green vegetable for dinner and he eats a lot of  different types of beans like pintos, butter bean or black eyes peas. Patient stated he has stopped drinking coffee and he usually drinks 5-6 bottle of water a day. Patient does enjoy a pepsi from time to time. Patient stated that both him and his wife both cook meals. Patient stated that he use to walk on a regular basis but he has had trouble with his knees lately and hasn't been walking as much but plans to get back to it soon. I encouraged patient to try some chair exercises when and if he can. Patient stated he is able to check the mailbox and take out the trash and things like that. Patient thanked me for my call. Patient is overdue for follow up with the clinical pharmacist. Patient was agreeable and request a date in august. We agree upon 05/17/21 at 3pm by phone. Message sent to MadJeni Salles schedule.   Star Rating Drugs:  Atorvastatin 23m74mlast filled on 02/15/21 90DS at [facility] Losartan 50mg71mast filled on 03/06/21 90DS at [facility] Metformin 1000mg 30mst  filled on 03/25/21 90DS at [facility] Repaglinide 23m - last filled on 02/17/21 90DS at [facility] Sitagliptin 1038m- [phone]

## 2021-03-26 NOTE — Telephone Encounter (Signed)
Left message for patient to call back and schedule Medicare Annual Wellness Visit (AWV) either virtually or in office.   Last AWV 05/15/18  please schedule at anytime with [facility] Nurse Health Advisor 1 or 2   This should be a 45 minute visit.

## 2021-03-29 NOTE — Telephone Encounter (Cosign Needed)
Call back Tuesday morning.

## 2021-03-30 ENCOUNTER — Ambulatory Visit: Payer: HMO | Admitting: Orthopaedic Surgery

## 2021-03-30 ENCOUNTER — Other Ambulatory Visit: Payer: Self-pay

## 2021-03-31 ENCOUNTER — Encounter: Payer: Self-pay | Admitting: Family Medicine

## 2021-03-31 ENCOUNTER — Ambulatory Visit (INDEPENDENT_AMBULATORY_CARE_PROVIDER_SITE_OTHER): Payer: HMO | Admitting: Family Medicine

## 2021-03-31 VITALS — BP 138/82 | HR 83 | Temp 98.1°F | Wt 241.2 lb

## 2021-03-31 DIAGNOSIS — I10 Essential (primary) hypertension: Secondary | ICD-10-CM

## 2021-03-31 DIAGNOSIS — E782 Mixed hyperlipidemia: Secondary | ICD-10-CM | POA: Diagnosis not present

## 2021-03-31 DIAGNOSIS — E1165 Type 2 diabetes mellitus with hyperglycemia: Secondary | ICD-10-CM

## 2021-03-31 LAB — COMPREHENSIVE METABOLIC PANEL
ALT: 24 U/L (ref 0–53)
AST: 25 U/L (ref 0–37)
Albumin: 4.4 g/dL (ref 3.5–5.2)
Alkaline Phosphatase: 43 U/L (ref 39–117)
BUN: 10 mg/dL (ref 6–23)
CO2: 25 mEq/L (ref 19–32)
Calcium: 9.6 mg/dL (ref 8.4–10.5)
Chloride: 101 mEq/L (ref 96–112)
Creatinine, Ser: 1 mg/dL (ref 0.40–1.50)
GFR: 75.19 mL/min (ref >60.00)
Glucose, Bld: 210 mg/dL — ABNORMAL HIGH (ref 70–99)
Potassium: 3.7 mEq/L (ref 3.5–5.1)
Sodium: 139 mEq/L (ref 135–145)
Total Bilirubin: 0.7 mg/dL (ref 0.2–1.2)
Total Protein: 7.4 g/dL (ref 6.0–8.3)

## 2021-03-31 LAB — LIPID PANEL
Cholesterol: 106 mg/dL (ref 0–200)
HDL: 34.2 mg/dL — ABNORMAL LOW (ref >39.00)
LDL Cholesterol: 57 mg/dL (ref 0–99)
NonHDL: 72.21
Total CHOL/HDL Ratio: 3
Triglycerides: 74 mg/dL (ref 0.0–149.0)
VLDL: 14.8 mg/dL (ref 0.0–40.0)

## 2021-03-31 LAB — VITAMIN B12: Vitamin B-12: 313 pg/mL (ref 211–911)

## 2021-03-31 LAB — POCT GLYCOSYLATED HEMOGLOBIN (HGB A1C): Hemoglobin A1C: 8.4 % — AB (ref 4.0–5.6)

## 2021-03-31 MED ORDER — REPAGLINIDE 2 MG PO TABS: ORAL_TABLET | ORAL | 5 refills | Status: DC

## 2021-03-31 NOTE — Addendum Note (Signed)
Addended by: Elmer Picker on: 03/31/2021 09:11 AM   Modules accepted: Orders

## 2021-03-31 NOTE — Patient Instructions (Signed)
We will increase your repaglinide to 4 mg (2 pills) before breakfast, lunch, and dinner daily.  If you skip a meal you should not take your dose of medication.  You are to continue your other medications including metformin 1000 mg twice daily
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
## 2021-04-08 ENCOUNTER — Telehealth: Payer: Self-pay | Admitting: Family Medicine

## 2021-04-08 NOTE — Telephone Encounter (Signed)
Patient's wife Hoyle Sauer called to ask if it was safe for patient to have his cataract surgery. She says that his sugar has been high (she is not sure what it is exactly) but he has cataract surgery scheduled for 07/07 or 07/08 and would like to know if he should proceed or wait.   Hoyle Sauer would like a call back at [phone].  Please advise.

## 2021-04-09 NOTE — Telephone Encounter (Signed)
Spoke with patients wife, got numbers she was referring to for his sugar reading, informed pcp. Advised that pcp states it is up to surgeon to precede or push back cataract surgery.

## 2021-04-23 DIAGNOSIS — H25811 Combined forms of age-related cataract, right eye: Secondary | ICD-10-CM | POA: Diagnosis not present

## 2021-04-29 ENCOUNTER — Telehealth: Payer: Self-pay | Admitting: Family Medicine

## 2021-04-29 NOTE — Telephone Encounter (Signed)
Left message for patient to call back and schedule Medicare Annual Wellness Visit (AWV) either virtually or in office.   Last AWV 05/15/18 please schedule at anytime with [facility] Nurse Health Advisor 1 or 2   This should be a 45 minute visit.

## 2021-05-11 ENCOUNTER — Telehealth: Payer: Self-pay | Admitting: Family Medicine

## 2021-05-11 NOTE — Telephone Encounter (Signed)
Called pharmacy to confirm refills remaining, refills are at pharmacy. Spoke with Hoyle Sauer, patients wife, informed her refill is at pharmacy and ready to be picked up.

## 2021-05-11 NOTE — Telephone Encounter (Signed)
Pt wife call and stated he need a 30 day supply of JANUVIA) 100 MG tablet sent to  [facility] [place], [place] Phone:  [phone]  Fax:  [phone]

## 2021-05-14 ENCOUNTER — Telehealth: Payer: Self-pay | Admitting: Pharmacist

## 2021-05-14 NOTE — Progress Notes (Signed)
    Chronic Care Management Pharmacy Assistant   Name: John Hobbs  MRN: [id number] DOB: 08-Apr-1946    05-14-2021 Patient called to remind of appointment with [name] Clinical Pharmacist on 05-17-21 at 3pm via phone call   Advise wife of appointment date, time and type of appointment (Via phone call ). Advised to have all medications, supplements, blood pressure and/or blood sugar logs available during appointment and to return call if need to reschedule.    [place] Clinical Pharmacist Assistant [phone]

## 2021-05-17 ENCOUNTER — Other Ambulatory Visit: Payer: Self-pay

## 2021-05-17 ENCOUNTER — Ambulatory Visit (INDEPENDENT_AMBULATORY_CARE_PROVIDER_SITE_OTHER): Payer: HMO | Admitting: Pharmacist

## 2021-05-17 DIAGNOSIS — E1165 Type 2 diabetes mellitus with hyperglycemia: Secondary | ICD-10-CM

## 2021-05-17 DIAGNOSIS — I10 Essential (primary) hypertension: Secondary | ICD-10-CM

## 2021-05-17 NOTE — Progress Notes (Signed)
Chronic Care Management Pharmacy Note  06/01/2021 Name:  JAMION CARTER MRN:  [id number] DOB:  08/18/1946  Summary: A1c not at goal < 7%  Recommendations/Changes made from today's visit: -Recommend SGLT2 inhibitor for further A1c lowering (choice depending on approval for patient assistance) -Recommend repeat vitamin D level  Plan: Follow up DM assessment in 1 month Follow up on PAP application   Subjective: CARREL LEATHER is an 75 y.o. year old male who is a primary patient of Billie Ruddy, MD.  The CCM team was consulted for assistance with disease management and care coordination needs.    Engaged with patient by telephone for follow up visit in response to provider referral for pharmacy case management and/or care coordination services.   Consent to Services:  The patient was given information about Chronic Care Management services, agreed to services, and gave verbal consent prior to initiation of services.  Please see initial visit note for detailed documentation.   Patient Care Team: Billie Ruddy, MD as PCP - General (Family Medicine) Ara Kussmaul, MD as Consulting Physician (Ophthalmology) Viona Gilmore, [facility] as Pharmacist (Pharmacist)  Recent office visits: 03/31/21 Grier Mitts, MD: Patient presented for HTN and DM follow up. Increase repaglinide to 4 mg before breakfast, lunch and dinner. Continue metformin and Januvia. Recommended vitamin B12 supplement.  02/17/21 Grier Mitts MD ( Family Medicine) - seen for type 2 diabetes with hyperglycemia without long term current use of insulin. Patient started on Amoxicillin 557m twice daily. Discontinued canagliflozin 3017m   12/21/20 ShGrier MittsD (Family Medicine) - seen for type 2 diabetes with neurological complications and other chronic conditions. No medication changes. Follow up in 3 months.  Recent consult visits: 02/08/21 ChJean RosenthalD (Orthopedic Surgery) - seen for unilateral  primary osteoarthritis left knee. No medication changes. Follow up as needed.   12/09/20 ChJean RosenthalD (Orthopedic Surgery) - seen for unilateral primary osteoarthritis right knee. No medication changes. Follow up as needed.   Hospital visits: None in previous 6 months   Objective:  Lab Results  Component Value Date   CREATININE 1.00 03/31/2021   BUN 10 03/31/2021   GFR 75.19 03/31/2021   GFRNONAA >60 08/17/2019   GFRAA >60 08/17/2019   NA 139 03/31/2021   K 3.7 03/31/2021   CALCIUM 9.6 03/31/2021   CO2 25 03/31/2021   GLUCOSE 210 (H) 03/31/2021    Lab Results  Component Value Date/Time   HGBA1C 8.4 (A) 03/31/2021 09:08 AM   HGBA1C 7.3 (A) 09/18/2020 11:41 AM   HGBA1C 8.3 (H) 01/30/2018 08:11 AM   HGBA1C 7.5 (H) 05/04/2017 10:11 AM   FRUCTOSAMINE 334 (H) 07/11/2019 08:40 AM   GFR 75.19 03/31/2021 09:11 AM   GFR 76.63 09/18/2019 02:22 PM   MICROALBUR 1.9 12/21/2020 10:04 AM   MICROALBUR 0.7 12/22/2015 09:02 AM    Last diabetic Eye exam:  Lab Results  Component Value Date/Time   HMDIABEYEEXA No Retinopathy 07/19/2017 12:00 AM    Last diabetic Foot exam: No results found for: HMDIABFOOTEX   Lab Results  Component Value Date   CHOL 106 03/31/2021   HDL 34.20 (L) 03/31/2021   LDLCALC 57 03/31/2021   TRIG 74.0 03/31/2021   CHOLHDL 3 03/31/2021    Hepatic Function Latest Ref Rng & Units 03/31/2021 09/04/2019 09/18/2018  Total Protein 6.0 - 8.3 g/dL 7.4 7.4 7.0  Albumin 3.5 - 5.2 g/dL 4.4 4.0 4.1  AST 0 - 37 U/L 25 16 15   ALT 0 -  53 U/L 24 16 13   Alk Phosphatase 39 - 117 U/L 43 54 45  Total Bilirubin 0.2 - 1.2 mg/dL 0.7 0.9 0.5  Bilirubin, Direct 0.0 - 0.3 mg/dL - - -    Lab Results  Component Value Date/Time   TSH 2.26 04/24/2020 02:07 PM   TSH 2.64 09/04/2019 12:23 PM   FREET4 0.9 04/24/2020 02:07 PM   FREET4 0.92 09/04/2019 12:23 PM    CBC Latest Ref Rng & Units 04/24/2020 09/04/2019 08/17/2019  WBC 3.8 - 10.8 Thousand/uL 8.5 10.7(H) 8.0   Hemoglobin 13.2 - 17.1 g/dL 14.0 13.9 14.5  Hematocrit 38.5 - 50.0 % 42.3 42.3 44.4  Platelets 140 - 400 Thousand/uL 280 297.0 295    Lab Results  Component Value Date/Time   VD25OH 28.72 (L) 09/04/2019 12:23 PM    Clinical ASCVD: No  The ASCVD Risk score Mikey Bussing DC Jr., et al., 2013) failed to calculate for the following reasons:   The valid total cholesterol range is 130 to 320 mg/dL    Depression screen [facility] 2/9 01/22/2020 09/04/2019 05/15/2018  Decreased Interest 0 3 0  Down, Depressed, Hopeless 0 3 0  PHQ - 2 Score 0 6 0  Altered sleeping 0 3 -  Tired, decreased energy 0 3 -  Change in appetite 0 3 -  Feeling bad or failure about yourself  0 0 -  Trouble concentrating 0 1 -  Moving slowly or fidgety/restless 0 1 -  Suicidal thoughts 0 0 -  PHQ-9 Score 0 17 -  Difficult doing work/chores - Not difficult at all -  Some recent data might be hidden      Social History   Tobacco Use  Smoking Status Never  Smokeless Tobacco Never   BP Readings from Last 3 Encounters:  03/31/21 138/82  02/17/21 122/78  12/21/20 132/82   Pulse Readings from Last 3 Encounters:  03/31/21 83  02/17/21 (!) 119  12/21/20 95   Wt Readings from Last 3 Encounters:  03/31/21 241 lb 3.2 oz (109.4 kg)  02/17/21 244 lb 9.6 oz (110.9 kg)  12/21/20 244 lb 9.6 oz (110.9 kg)   BMI Readings from Last 3 Encounters:  03/31/21 34.12 kg/m  02/17/21 34.60 kg/m  12/21/20 34.60 kg/m    Assessment/Interventions: Review of patient past medical history, allergies, medications, health status, including review of consultants reports, laboratory and other test data, was performed as part of comprehensive evaluation and provision of chronic care management services.   SDOH:  (Social Determinants of Health) assessments and interventions performed: No  SDOH Screenings   Alcohol Screen: Not on file  Depression (PHQ2-9): Not on file  Financial Resource Strain: Not on file  Food Insecurity: Not on file   Housing: Not on file  Physical Activity: Not on file  Social Connections: Not on file  Stress: Not on file  Tobacco Use: Low Risk    Smoking Tobacco Use: Never   Smokeless Tobacco Use: Never  Transportation Needs: Not on file    [place]  No Known Allergies  Medications Reviewed Today     Reviewed by Billie Ruddy, MD (Physician) on 03/31/21 at [place] List Status: <None>   Medication Order Taking? Sig Documenting Provider Last Dose Status Informant  amLODipine (NORVASC) 5 MG tablet [id number] Yes TAKE 1 TABLET BY MOUTH DAILY Billie Ruddy, MD Taking Active   aspirin EC 81 MG tablet [id number] Yes Take 81 mg by mouth daily after breakfast. [provider] Taking Active  Self  atenolol-chlorthalidone (TENORETIC) 100-25 MG tablet [id number] Yes TAKE 1/2 TABLET BY MOUTH DAILY Billie Ruddy, MD Taking Active   atorvastatin (LIPITOR) 20 MG tablet [id number] Yes TAKE 1 TABLET BY MOUTH EVERY DAY Billie Ruddy, MD Taking Active   glucose blood test strip [id number] Yes Use as instructed for checking fsbs TID. Billie Ruddy, MD Taking Active   Lancets [facility] ULTRASOFT) lancets [id number] Yes Use to check blood sugar once daily Billie Ruddy, MD Taking Active   loratadine (CLARITIN) 10 MG tablet [id number] Yes TAKE 1 TABLET(10 MG) BY MOUTH DAILY Lucretia Kern, DO Taking Active Self  losartan (COZAAR) 50 MG tablet [id number] Yes TAKE 1 TABLET(50 MG) BY MOUTH DAILY Billie Ruddy, MD Taking Active   metFORMIN (GLUCOPHAGE) 1000 MG tablet [id number] Yes Take 1 tablet (1,000 mg total) by mouth 2 (two) times daily with a meal. Billie Ruddy, MD Taking Active   ONE [facility] LANCETS MISC [id number] Yes Test once day Lucretia Kern, DO Taking Active Self  [facility] VERIO test strip [id number] Yes USE TO CHECK BLOOD SUGAR ONCE DAILY Billie Ruddy, MD Taking Active   potassium chloride SA (KLOR-CON) 20 MEQ tablet [id number] Yes TAKE 1/2 TABLET(10 MEQ) BY MOUTH DAILY Billie Ruddy, MD Taking Active   repaglinide (PRANDIN) 2 MG tablet [id number] Yes Take 2 tablets (4 mg) daily before breakfast, 2 tablets (4 mg) daily for lunch, and 2 tablets (4 mg) daily before dinner. Billie Ruddy, MD  Active   sitaGLIPtin (JANUVIA) 100 MG tablet [id number] Yes Take 1 tablet (100 mg total) by mouth daily. Billie Ruddy, MD Taking Active   traMADol Veatrice Bourbon) 50 MG tablet [id number] Yes Take 1 tablet (50 mg total) by mouth every 6 (six) hours as needed. Pete Pelt, PA-C Taking Active   triamcinolone (NASACORT) 55 MCG/ACT AERO nasal inhaler [id number] Yes Place 2 sprays into the nose daily as needed (congestion). [provider] Taking Active Self  Vitamin D, Ergocalciferol, (DRISDOL) 1.25 MG (50000 UT) CAPS capsule [id number] Yes Take 1 capsule (50,000 Units total) by mouth every 7 (seven) days. Billie Ruddy, MD Taking Active             Patient Active Problem List   Diagnosis Date Noted   Allergic rhinitis    Type 2 diabetes mellitus with hyperglycemia, without long-term current use of insulin ([place]) 10/10/2019   Unilateral primary osteoarthritis, right knee 05/13/2019   Status post arthroscopy of right knee 12/19/2018   Chronic left shoulder pain 03/13/2018   Impingement syndrome of left shoulder 03/13/2018   Morbid obesity ([place]) 09/21/2017   Hyperlipidemia associated with type 2 diabetes mellitus ([place]) 01/24/2017   Acute medial meniscal tear 08/10/2015   NASH (nonalcoholic steatohepatitis) 07/28/2015   Type 2 diabetes mellitus with neurological manifestations, uncontrolled ([place]) 12/30/2013   Hypertension associated with diabetes ([place]) 07/02/2008    Immunization History  Administered Date(s) Administered   Fluad Quad(high Dose 65+) 07/02/2020   Influenza Split 08/29/2011, 11/19/2012   Influenza Whole 08/22/2007, 01/07/2010   Influenza, High Dose Seasonal PF 07/28/2015, 09/23/2016, 09/21/2017, 09/18/2018, 07/09/2019   Influenza,inj,Quad PF,6+ Mos  06/24/2013, 07/04/2014   PFIZER(Purple Top)SARS-COV-2 Vaccination 12/09/2019, 12/30/2019, 07/24/2020   Pneumococcal Conjugate-13 02/18/2015   Pneumococcal Polysaccharide-23 01/07/2010, 11/22/2013   Tdap 08/29/2011   Zoster, Live 11/22/2013   Patient reports that his diabetes medications are expensive and may be in the donut hole. He also has not noticed a difference in his blood sugars since  the dose increase of repaglinide.  Conditions to be addressed/monitored:  Hypertension, Hyperlipidemia, Diabetes, Allergic Rhinitis, and Vitamin D deficiency  Conditions addressed this visit: Diabetes, hypertension  Care Plan : CCM Pharmacy Care Plan  Updates made by Viona Gilmore, [place] since 06/01/2021 12:00 AM     Problem: Problem: Hypertension, Hyperlipidemia, Diabetes, Allergic Rhinitis, and Vitamin D deficiency      Long-Range Goal: Patient-Specific Goal   Start Date: 05/17/2021  Expected End Date: 05/17/2022  This Visit's Progress: On track  Priority: High  Note:   Current Barriers:  Unable to independently afford treatment regimen Unable to independently monitor therapeutic efficacy Unable to achieve control of diabetes   Pharmacist Clinical Goal(s):  Patient will achieve adherence to monitoring guidelines and medication adherence to achieve therapeutic efficacy achieve control of diabetes as evidenced by A1c  through collaboration with PharmD and provider.   Interventions: 1:1 collaboration with Billie Ruddy, MD regarding development and update of comprehensive plan of care as evidenced by provider attestation and co-signature Inter-disciplinary care team collaboration (see longitudinal plan of care) Comprehensive medication review performed; medication list updated in electronic medical record  Hypertension (BP goal <140/90) -Controlled -Current treatment: Amlodipine 97m, 1 tablet once daily Atenolol-chlorthalidone 100-249m 0.5 (one-half) tablet once daily Losartan 5051m 1 tablet once daily  -Medications previously tried: none  -Current home readings: 120-130/80s -Current dietary habits: limits salt intake -Current exercise habits: did not discuss -Denies hypotensive/hypertensive symptoms -Educated on Daily salt intake goal < 2300 mg; Exercise goal of 150 minutes per week; Importance of home blood pressure monitoring; Proper BP monitoring technique; -Counseled to monitor BP at home weekly, document, and provide log at future appointments -Counseled on diet and exercise extensively Recommended to continue current medication  Hyperlipidemia: (LDL goal < 70) -Controlled -Current treatment: Atorvastatin 56m57m tablet once daily  -Medications previously tried: pravastatin -Current dietary patterns: did not discuss -Current exercise habits: did not discuss -Educated on Cholesterol goals;  -Counseled on diet and exercise extensively Recommended to continue current medication  Diabetes (A1c goal <7%) -Uncontrolled -Current medications: Metformin 1000mg47mtablet twice daily with a meal repaglinide 2mg, 54mablets before breakfast, 2 tablets before lunch, and 1 tablet before supper  Januvia 100mg, 61mblet once daily  -Medications previously tried: glipizide, Novolin N, pioglitazone, Invokana (cost)  -Current home glucose readings fasting glucose: 130-200; usually ranging in the 160-180s (checking daily) post prandial glucose: does not check -Denies hypoglycemic/hyperglycemic symptoms -Current meal patterns:  breakfast: did not discuss  lunch: did not discuss  dinner: eating more vegetables with dinner snacks: did not discuss drinks: did not discuss -Current [facility]; not currently [facility] -Educated on A1c and blood sugar goals; Exercise goal of 150 minutes per week; Carbohydrate counting and/or plate method -Counseled to check feet daily and get yearly eye exams -Counseled on diet and exercise  extensively Recommended to continue current medication Recommended SGLT2 therapy as patient has tolerated in the past.  Low vitamin D (Goal: 30-100) -Uncontrolled -Current treatment  No medications -Medications previously tried: none  -Recommended repeat vitamin D level  Allergic rhinitis (Goal: minimize symptoms) -Controlled -Current treatment  Loratadine 10mg, 156mlet once daily (currently not taking) Triamcinolone (Nasacort) 55mcg/ a12masal spray, 2 sprays into nose daily as needed -Medications previously tried: none  -Recommended to continue current medication   Health Maintenance -Vaccine gaps: shingrix, COVID booster -Current therapy:  No medications -Educated on Cost vs benefit of each product must  be carefully weighed by individual consumer -Patient is satisfied with current therapy and denies issues -Counseled on benefits of multivitamin with previous low vitamin D  Patient Goals/Self-Care Activities Patient will:  - check glucose daily, document, and provide at future appointments check blood pressure weekly, document, and provide at future appointments target a minimum of 150 minutes of moderate intensity exercise weekly  Follow Up Plan: Telephone follow up appointment with care management team member scheduled for: 4 months         Medication Assistance: Application for SGLT  medication assistance program. in process.  Anticipated assistance start date 06/17/21.  See plan of care for additional detail.  Compliance/Adherence/Medication fill history: Care Gaps: Shingrix, eye exam, COVID booster, influenza  Star-Rating Drugs: Januvia 100 mg - last filled 05/08/21 for 30 ds at [facility] Losartan 50 mg - last filled 03/06/21 for 90 ds at [facility] Atorvastatin 20 mg - last filled 02/15/21 for 90 ds at [facility] Metformin 1000 mg - last filled 03/26/21 for 90 ds [facility] Repaglinide 2 mg - last filled 04/27/21 for 90 ds at [facility]  Patient's preferred pharmacy  is:  [facility] [place], [address] [place] [address] Phone: [phone] Fax: [phone]  [place] ([place] - [address] [place] ([place] [address] Phone: [phone] Fax: [phone]  Uses pill box? Yes Pt endorses 99% compliance   We discussed: Current pharmacy is preferred with insurance plan and patient is satisfied with pharmacy services Patient decided to: Continue current medication management strategy  Care Plan and Follow Up Patient Decision:  Patient agrees to Care Plan and Follow-up.  Plan: Telephone follow up appointment with care management team member scheduled for:  4 months  [name], PharmD, [place] Pharmacist [place] at [place] [phone]

## 2021-05-28 ENCOUNTER — Ambulatory Visit: Payer: HMO

## 2021-06-02 DIAGNOSIS — H2512 Age-related nuclear cataract, left eye: Secondary | ICD-10-CM | POA: Diagnosis not present

## 2021-06-04 DIAGNOSIS — H25812 Combined forms of age-related cataract, left eye: Secondary | ICD-10-CM | POA: Diagnosis not present

## 2021-06-08 ENCOUNTER — Ambulatory Visit (INDEPENDENT_AMBULATORY_CARE_PROVIDER_SITE_OTHER): Payer: HMO

## 2021-06-08 DIAGNOSIS — Z0000 Encounter for general adult medical examination without abnormal findings: Secondary | ICD-10-CM | POA: Diagnosis not present

## 2021-06-08 NOTE — Patient Instructions (Signed)
John Hobbs , Thank you for taking time to come for your Medicare Wellness Visit. I appreciate your ongoing commitment to your health goals. Please review the following plan we discussed and let me know if I can assist you in the future.   Screening recommendations/referrals: Colonoscopy: 02/24/2016  due 2027 Recommended yearly ophthalmology/optometry visit for glaucoma screening and checkup Recommended yearly dental visit for hygiene and checkup  Vaccinations: Influenza vaccine: due in fall 2022  Pneumococcal vaccine: completed series  Tdap vaccine: due upon injury  Shingles vaccine: declined     Advanced directives: none   Conditions/risks identified: none   Next appointment: none   Preventive Care 73 Years and Older, Male Preventive care refers to lifestyle choices and visits with your health care provider that can promote health and wellness. What does preventive care include? A yearly physical exam. This is also called an annual well check. Dental exams once or twice a year. Routine eye exams. Ask your health care provider how often you should have your eyes checked. Personal lifestyle choices, including: Daily care of your teeth and gums. Regular physical activity. Eating a healthy diet. Avoiding tobacco and drug use. Limiting alcohol use. Practicing safe sex. Taking low doses of aspirin every day. Taking vitamin and mineral supplements as recommended by your health care provider. What happens during an annual well check? The services and screenings done by your health care provider during your annual well check will depend on your age, overall health, lifestyle risk factors, and family history of disease. Counseling  Your health care provider may ask you questions about your: Alcohol use. Tobacco use. Drug use. Emotional well-being. Home and relationship well-being. Sexual activity. Eating habits. History of falls. Memory and ability to understand  (cognition). Work and work Statistician. Screening  You may have the following tests or measurements: Height, weight, and BMI. Blood pressure. Lipid and cholesterol levels. These may be checked every 5 years, or more frequently if you are over 42 years old. Skin check. Lung cancer screening. You may have this screening every year starting at age 31 if you have a 30-pack-year history of smoking and currently smoke or have quit within the past 15 years. Fecal occult blood test (FOBT) of the stool. You may have this test every year starting at age 57. Flexible sigmoidoscopy or colonoscopy. You may have a sigmoidoscopy every 5 years or a colonoscopy every 10 years starting at age 59. Prostate cancer screening. Recommendations will vary depending on your family history and other risks. Hepatitis C blood test. Hepatitis B blood test. Sexually transmitted disease (STD) testing. Diabetes screening. This is done by checking your blood sugar (glucose) after you have not eaten for a while (fasting). You may have this done every 1-3 years. Abdominal aortic aneurysm (AAA) screening. You may need this if you are a current or former smoker. Osteoporosis. You may be screened starting at age 47 if you are at high risk. Talk with your health care provider about your test results, treatment options, and if necessary, the need for more tests. Vaccines  Your health care provider may recommend certain vaccines, such as: Influenza vaccine. This is recommended every year. Tetanus, diphtheria, and acellular pertussis (Tdap, Td) vaccine. You may need a Td booster every 10 years. Zoster vaccine. You may need this after age 19. Pneumococcal 13-valent conjugate (PCV13) vaccine. One dose is recommended after age 31. Pneumococcal polysaccharide (PPSV23) vaccine. One dose is recommended after age 76. Talk to your health care provider about which  screenings and vaccines you need and how often you need them. This  information is not intended to replace advice given to you by your health care provider. Make sure you discuss any questions you have with your health care provider. Document Released: 10/30/2015 Document Revised: 06/22/2016 Document Reviewed: 08/04/2015 Elsevier Interactive Patient Education  2017 [place] Prevention in the Home Falls can cause injuries. They can happen to people of all ages. There are many things you can do to make your home safe and to help prevent falls. What can I do on the outside of my home? Regularly fix the edges of walkways and driveways and fix any cracks. Remove anything that might make you trip as you walk through a door, such as a raised step or threshold. Trim any bushes or trees on the path to your home. Use bright outdoor lighting. Clear any walking paths of anything that might make someone trip, such as rocks or tools. Regularly check to see if handrails are loose or broken. Make sure that both sides of any steps have handrails. Any raised decks and porches should have guardrails on the edges. Have any leaves, snow, or ice cleared regularly. Use sand or salt on walking paths during winter. Clean up any spills in your garage right away. This includes oil or grease spills. What can I do in the bathroom? Use night lights. Install grab bars by the toilet and in the tub and shower. Do not use towel bars as grab bars. Use non-skid mats or decals in the tub or shower. If you need to sit down in the shower, use a plastic, non-slip stool. Keep the floor dry. Clean up any water that spills on the floor as soon as it happens. Remove soap buildup in the tub or shower regularly. Attach bath mats securely with double-sided non-slip rug tape. Do not have throw rugs and other things on the floor that can make you trip. What can I do in the bedroom? Use night lights. Make sure that you have a light by your bed that is easy to reach. Do not use any sheets or  blankets that are too big for your bed. They should not hang down onto the floor. Have a firm chair that has side arms. You can use this for support while you get dressed. Do not have throw rugs and other things on the floor that can make you trip. What can I do in the kitchen? Clean up any spills right away. Avoid walking on wet floors. Keep items that you use a lot in easy-to-reach places. If you need to reach something above you, use a strong step stool that has a grab bar. Keep electrical cords out of the way. Do not use floor polish or wax that makes floors slippery. If you must use wax, use non-skid floor wax. Do not have throw rugs and other things on the floor that can make you trip. What can I do with my stairs? Do not leave any items on the stairs. Make sure that there are handrails on both sides of the stairs and use them. Fix handrails that are broken or loose. Make sure that handrails are as long as the stairways. Check any carpeting to make sure that it is firmly attached to the stairs. Fix any carpet that is loose or worn. Avoid having throw rugs at the top or bottom of the stairs. If you do have throw rugs, attach them to the floor with carpet  tape. Make sure that you have a light switch at the top of the stairs and the bottom of the stairs. If you do not have them, ask someone to add them for you. What else can I do to help prevent falls? Wear shoes that: Do not have high heels. Have rubber bottoms. Are comfortable and fit you well. Are closed at the toe. Do not wear sandals. If you use a stepladder: Make sure that it is fully opened. Do not climb a closed stepladder. Make sure that both sides of the stepladder are locked into place. Ask someone to hold it for you, if possible. Clearly mark and make sure that you can see: Any grab bars or handrails. First and last steps. Where the edge of each step is. Use tools that help you move around (mobility aids) if they are  needed. These include: Canes. Walkers. Scooters. Crutches. Turn on the lights when you go into a dark area. Replace any light bulbs as soon as they burn out. Set up your furniture so you have a clear path. Avoid moving your furniture around. If any of your floors are uneven, fix them. If there are any pets around you, be aware of where they are. Review your medicines with your doctor. Some medicines can make you feel dizzy. This can increase your chance of falling. Ask your doctor what other things that you can do to help prevent falls. This information is not intended to replace advice given to you by your health care provider. Make sure you discuss any questions you have with your health care provider. Document Released: 07/30/2009 Document Revised: 03/10/2016 Document Reviewed: 11/07/2014 Elsevier Interactive Patient Education  2017 [employer].
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
## 2021-06-10 ENCOUNTER — Other Ambulatory Visit: Payer: Self-pay | Admitting: Family Medicine

## 2021-06-10 DIAGNOSIS — I10 Essential (primary) hypertension: Secondary | ICD-10-CM

## 2021-06-30 ENCOUNTER — Other Ambulatory Visit: Payer: Self-pay

## 2021-06-30 ENCOUNTER — Encounter: Payer: Self-pay | Admitting: Family Medicine

## 2021-06-30 ENCOUNTER — Ambulatory Visit (INDEPENDENT_AMBULATORY_CARE_PROVIDER_SITE_OTHER): Payer: HMO | Admitting: Family Medicine

## 2021-06-30 VITALS — BP 118/72 | HR 74 | Temp 98.1°F | Wt 239.4 lb

## 2021-06-30 DIAGNOSIS — E876 Hypokalemia: Secondary | ICD-10-CM

## 2021-06-30 DIAGNOSIS — E1149 Type 2 diabetes mellitus with other diabetic neurological complication: Secondary | ICD-10-CM | POA: Diagnosis not present

## 2021-06-30 DIAGNOSIS — I10 Essential (primary) hypertension: Secondary | ICD-10-CM | POA: Diagnosis not present

## 2021-06-30 DIAGNOSIS — M25562 Pain in left knee: Secondary | ICD-10-CM

## 2021-06-30 DIAGNOSIS — M25561 Pain in right knee: Secondary | ICD-10-CM

## 2021-06-30 DIAGNOSIS — G8929 Other chronic pain: Secondary | ICD-10-CM | POA: Diagnosis not present

## 2021-06-30 MED ORDER — POTASSIUM CHLORIDE CRYS ER 20 MEQ PO TBCR: EXTENDED_RELEASE_TABLET | ORAL | 3 refills | Status: DC
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
## 2021-07-19 ENCOUNTER — Other Ambulatory Visit: Payer: Self-pay | Admitting: Family Medicine

## 2021-07-19 DIAGNOSIS — E1165 Type 2 diabetes mellitus with hyperglycemia: Secondary | ICD-10-CM

## 2021-07-30 ENCOUNTER — Telehealth: Payer: Self-pay | Admitting: Pharmacist

## 2021-07-30 NOTE — Chronic Care Management (AMB) (Signed)
Chronic Care Management Pharmacy Assistant   Name: John Hobbs  MRN: [id number] DOB: 05-Jan-1946  Reason for Encounter: Disease State/ Diabetes Assessment Call.   Conditions to be addressed/monitored: DMII   Recent office visits:  06/30/21 Grier Mitts MD (PCP) - seen for hypertension and other issues. No medication changes. Follow up in 1 month.   Recent consult visits:  None.  Hospital visits:  None in previous 6 months  Medications: Outpatient Encounter Medications as of 07/30/2021  Medication Sig   amLODipine (NORVASC) 5 MG tablet TAKE 1 TABLET BY MOUTH DAILY   aspirin EC 81 MG tablet Take 81 mg by mouth daily after breakfast.   atenolol-chlorthalidone (TENORETIC) 100-25 MG tablet TAKE 1/2 TABLET BY MOUTH DAILY   atorvastatin (LIPITOR) 20 MG tablet TAKE 1 TABLET BY MOUTH EVERY DAY   glucose blood test strip Use as instructed for checking fsbs TID.   Lancets (ONETOUCH ULTRASOFT) lancets Use to check blood sugar once daily   loratadine (CLARITIN) 10 MG tablet TAKE 1 TABLET(10 MG) BY MOUTH DAILY   losartan (COZAAR) 50 MG tablet TAKE 1 TABLET(50 MG) BY MOUTH DAILY   metFORMIN (GLUCOPHAGE) 1000 MG tablet Take 1 tablet (1,000 mg total) by mouth 2 (two) times daily with a meal.   ONE TOUCH LANCETS MISC Test once day   ONETOUCH VERIO test strip USE TO CHECK BLOOD SUGAR ONCE DAILY   potassium chloride SA (KLOR-CON) 20 MEQ tablet TAKE 1/2 TABLET(10 MEQ) BY MOUTH DAILY   repaglinide (PRANDIN) 2 MG tablet TAKE 2 TABLETS BY MOUTH DAILY BEFORE BREAKFAST, 2 TABLETS BEFORE LUNCH, AND 1 TABLET BEFORE SUPPER   sitaGLIPtin (JANUVIA) 100 MG tablet Take 1 tablet (100 mg total) by mouth daily.   traMADol (ULTRAM) 50 MG tablet Take 1 tablet (50 mg total) by mouth every 6 (six) hours as needed. (Patient not taking: No sig reported)   triamcinolone (NASACORT) 55 MCG/ACT AERO nasal inhaler Place 2 sprays into the nose daily as needed (congestion). (Patient not taking: No sig reported)   No  facility-administered encounter medications on file as of 07/30/2021.   Fill History: ATENOLOL 100MG/CHLORTHAL 25MG TABS 06/10/2021 90   ATORVASTATIN 20MG TABLETS 05/16/2021 90   ONE TOUCH VERIO TEST ST(NEW)100S 06/16/2021 90   LOSARTAN 50MG TABLETS 06/04/2021 90   POTASSIUM CL 20MEQ ER TABLETS 06/30/2021 90   REPAGLINIDE 2MG TABLETS 07/22/2021 90   JANUVIA 100MG TABLETS 07/08/2021 30   AMLODIPINE BESYLATE 5MG TABLETS 07/11/2021 90   METFORMIN 1000MG TABLETS 03/26/2021 90   Recent Relevant Labs: Lab Results  Component Value Date/Time   HGBA1C 8.4 (A) 03/31/2021 09:08 AM   HGBA1C 7.3 (A) 09/18/2020 11:41 AM   HGBA1C 8.3 (H) 01/30/2018 08:11 AM   HGBA1C 7.5 (H) 05/04/2017 10:11 AM   MICROALBUR 1.9 12/21/2020 10:04 AM   MICROALBUR 0.7 12/22/2015 09:02 AM    Kidney Function Lab Results  Component Value Date/Time   CREATININE 1.00 03/31/2021 09:11 AM   CREATININE 1.13 04/24/2020 02:07 PM   CREATININE 1.14 09/18/2019 02:22 PM   CREATININE 1.01 10/22/2013 10:11 AM   GFR 75.19 03/31/2021 09:11 AM   GFRNONAA >60 08/17/2019 02:48 PM   GFRNONAA 78 10/22/2013 10:11 AM   GFRAA >60 08/17/2019 02:48 PM   GFRAA >89 10/22/2013 10:11 AM    Current antihyperglycemic regimen:  Metformin 1043m - take 1 tablet two times daily.  Repaglinide 251m 2 tablet before breakfast and lunch and 1 tablet before dinner. Patient reports he was increased to 2 tablets  before dinner as well.  Sitagliptin 158m - take 1 tablet by mouth daily.  What recent interventions/DTPs have been made to improve glycemic control:  None. Have there been any recent hospitalizations or ED visits since last visit with CPP? No Patient denies hypoglycemic symptoms, including None Patient denies hyperglycemic symptoms, including none How often are you checking your blood sugar? once daily What are your blood sugars ranging?  Fasting: 120 on 08/02/21,124 on 08/03/21 and 136 on 08/05/21 During the week, how often does  your blood glucose drop below 70? Never Are you checking your feet daily/regularly? No.   Adherence Review: Is the patient currently on a STATIN medication? Yes Is the patient currently on ACE/ARB medication? Yes Does the patient have >5 day gap between last estimated fill dates? No  Notes: Spoke with patient and reviewed all medications as prescribed. Patient reports taking all medications and no issues. Patient stated that his repaglinide was increased to 2 tablets with dinner as well and he's been taking that for over the last month. Patient stated his sugars have stayed between the 120s-130s range. Patient has cut a lot of higher carb food out of his diet. Patient uses an exercise bike a couple times a week for about 30 minutes and does things around the house. Patient thanked me for my call.   Care Gaps:  AWV - completed on 06/08/21 Zoster vaccines - never done Ophthalmology exam - overdue since 2019 Covid-19 vaccine booster 4 - overdue since 2/22 Flu vaccine - due Last A1C - 8.4 on 03/31/21  Star Rating Drugs:  Atorvastatin 243m- last filled on 05/16/21 90DS at [facility] Losartan 5081m last filled on 06/04/21 90DS at [facility] Metformin 1000m51mlast filled on 03/26/21 90DS at [facility] Repaglinide 2mg 22mast filled on 07/22/21 90DS at [facility]  Sitagliptin 100mg 9mst [facility] [phone]

## 2021-08-02 ENCOUNTER — Ambulatory Visit (INDEPENDENT_AMBULATORY_CARE_PROVIDER_SITE_OTHER): Payer: HMO | Admitting: Family Medicine

## 2021-08-02 ENCOUNTER — Encounter: Payer: Self-pay | Admitting: Family Medicine

## 2021-08-02 ENCOUNTER — Other Ambulatory Visit: Payer: Self-pay

## 2021-08-02 VITALS — BP 128/76 | HR 72 | Temp 98.4°F | Wt 237.8 lb

## 2021-08-02 DIAGNOSIS — I10 Essential (primary) hypertension: Secondary | ICD-10-CM | POA: Diagnosis not present

## 2021-08-02 DIAGNOSIS — E1149 Type 2 diabetes mellitus with other diabetic neurological complication: Secondary | ICD-10-CM | POA: Diagnosis not present

## 2021-08-02 DIAGNOSIS — Z23 Encounter for immunization: Secondary | ICD-10-CM | POA: Diagnosis not present

## 2021-08-02 LAB — POCT GLYCOSYLATED HEMOGLOBIN (HGB A1C): Hemoglobin A1C: 7.5 % — AB (ref 4.0–5.6)
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
## 2021-08-02 NOTE — Patient Instructions (Addendum)
Your A1c was 7.5% this visit.  Keep up the good work.  It was 8.4% on 03/31/2021.  For your next appointment in a few months, consider scheduling your physical.

## 2021-08-03 NOTE — Telephone Encounter (Cosign Needed)
2nd attempt

## 2021-08-12 ENCOUNTER — Other Ambulatory Visit: Payer: Self-pay | Admitting: Family Medicine

## 2021-10-05 ENCOUNTER — Other Ambulatory Visit: Payer: Self-pay | Admitting: Family Medicine

## 2021-10-08 ENCOUNTER — Encounter: Payer: Self-pay | Admitting: Family Medicine

## 2021-10-08 ENCOUNTER — Ambulatory Visit (INDEPENDENT_AMBULATORY_CARE_PROVIDER_SITE_OTHER): Payer: HMO | Admitting: Family Medicine

## 2021-10-08 VITALS — BP 126/88 | HR 78 | Temp 98.0°F | Wt 234.6 lb

## 2021-10-08 DIAGNOSIS — I10 Essential (primary) hypertension: Secondary | ICD-10-CM

## 2021-10-08 DIAGNOSIS — E1165 Type 2 diabetes mellitus with hyperglycemia: Secondary | ICD-10-CM | POA: Diagnosis not present

## 2021-10-08 DIAGNOSIS — R42 Dizziness and giddiness: Secondary | ICD-10-CM | POA: Diagnosis not present

## 2021-10-08 MED ORDER — FEXOFENADINE HCL 180 MG PO TABS: 180.0000 mg | ORAL_TABLET | Freq: Every day | ORAL | 0 refills | Status: AC

## 2021-10-08 MED ORDER — REPAGLINIDE 2 MG PO TABS: ORAL_TABLET | ORAL | 3 refills | Status: DC
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
## 2021-10-08 NOTE — Patient Instructions (Addendum)
At today's visit we discussed increasing your Repaglinide (Prandin) dose at dinner.  You will continue taking 2 tabs before breakfast, 2 tabs before lunch, and now 3 tabs before dinner.  It is important that when she takes the medication you are getting something to eat) and not waiting.  I sent in a new prescription for the increased amount to make sure you knew you have enough medication.  I also sent in a prescription for Allegra, an allergy medication to see if it will help with the fullness in your ear that may be contributing to your dizziness.  Let us know if you are still noticing the symptoms.

## 2021-10-29 ENCOUNTER — Telehealth: Payer: Self-pay | Admitting: Pharmacist

## 2021-10-29 NOTE — Chronic Care Management (AMB) (Signed)
Chronic Care Management Pharmacy Assistant   Name: John Hobbs  MRN: [id number] DOB: Feb 02, 1946  Reason for Encounter: Disease State / Diabetes Assessment Call   Conditions to be addressed/monitored: DMII   Recent office visits:  10/08/2021 Grier Mitts MD - Patient was seen for dizziness and additional issues. Started Allegra 180 mg daily. Changed Prandin to 2 mg - TAKE 2 TABLETS BY MOUTH DAILY BEFORE BREAKFAST, 2 TABLETS BEFORE LUNCH, AND 1 TABLET BEFORE SUPPER. Discontinued Loratadine.  Follow up in 1 month.  08/02/2021 Grier Mitts MD - Patient was seen for type 2 diabetes with neurological complications and additional issue. No medication changes. Follow up in 4 months.  Recent consult visits:  None   Hospital visits:  None  Medications: Outpatient Encounter Medications as of 10/29/2021  Medication Sig   amLODipine (NORVASC) 5 MG tablet TAKE 1 TABLET BY MOUTH DAILY   aspirin EC 81 MG tablet Take 81 mg by mouth daily after breakfast.   atenolol-chlorthalidone (TENORETIC) 100-25 MG tablet TAKE 1/2 TABLET BY MOUTH DAILY   atorvastatin (LIPITOR) 20 MG tablet TAKE 1 TABLET BY MOUTH EVERY DAY   fexofenadine (ALLEGRA) 180 MG tablet Take 1 tablet (180 mg total) by mouth daily.   glucose blood test strip Use as instructed for checking fsbs TID.   Lancets (ONETOUCH ULTRASOFT) lancets Use to check blood sugar once daily   losartan (COZAAR) 50 MG tablet TAKE 1 TABLET(50 MG) BY MOUTH DAILY   metFORMIN (GLUCOPHAGE) 1000 MG tablet Take 1 tablet (1,000 mg total) by mouth 2 (two) times daily with a meal.   ONE TOUCH LANCETS MISC Test once day   ONETOUCH VERIO test strip USE TO CHECK BLOOD SUGAR ONCE DAILY   potassium chloride SA (KLOR-CON) 20 MEQ tablet TAKE 1/2 TABLET(10 MEQ) BY MOUTH DAILY   repaglinide (PRANDIN) 2 MG tablet TAKE 2 TABLETS BY MOUTH DAILY BEFORE BREAKFAST, 2 TABLETS BEFORE LUNCH, AND 1 TABLET BEFORE SUPPER   repaglinide (PRANDIN) 2 MG tablet Take 2 tabs (24m)  before breakfast, 2 tabs (4 mg) before lunch, and 3 tabs (6 mg) before dinner.   sitaGLIPtin (JANUVIA) 100 MG tablet Take 1 tablet (100 mg total) by mouth daily.   traMADol (ULTRAM) 50 MG tablet Take 1 tablet (50 mg total) by mouth every 6 (six) hours as needed.   triamcinolone (NASACORT) 55 MCG/ACT AERO nasal inhaler Place 2 sprays into the nose daily as needed (congestion).   No facility-administered encounter medications on file as of 10/29/2021.  Fill History:  ATENOLOL 100MG/CHLORTHAL 25MG TABS 09/13/2021 90   ATORVASTATIN 20MG TABLETS 08/12/2021 90   BROMOCRIPTINE 2.5MG TABLETS 07/24/2019 84   LOSARTAN 50MG TABLETS 09/05/2021 90   POTASSIUM CL 20MEQ ER TABLETS 10/05/2021 90   REPAGLINIDE 2MG TABLET 10/07/2021 90   JANUVIA 100MG TABLETS 10/05/2021 30   AMLODIPINE BESYLATE 5MG TABLETS 10/05/2021 90   METFORMIN 1000MG TABLETS 08/12/2021 90   Recent Relevant Labs: Lab Results  Component Value Date/Time   HGBA1C 7.5 (A) 08/02/2021 02:37 PM   HGBA1C 8.4 (A) 03/31/2021 09:08 AM   HGBA1C 8.3 (H) 01/30/2018 08:11 AM   HGBA1C 7.5 (H) 05/04/2017 10:11 AM   MICROALBUR 1.9 12/21/2020 10:04 AM   MICROALBUR 0.7 12/22/2015 09:02 AM    Kidney Function Lab Results  Component Value Date/Time   CREATININE 1.00 03/31/2021 09:11 AM   CREATININE 1.13 04/24/2020 02:07 PM   CREATININE 1.14 09/18/2019 02:22 PM   CREATININE 1.01 10/22/2013 10:11 AM   GFR 75.19 03/31/2021 09:11  AM   GFRNONAA >60 08/17/2019 02:48 PM   GFRNONAA 78 10/22/2013 10:11 AM   GFRAA >60 08/17/2019 02:48 PM   GFRAA >89 10/22/2013 10:11 AM    Current antihyperglycemic regimen:  Metformin 1000 mg twice daily Prandin 2 mg TAKE 2 TABLETS BY MOUTH DAILY BEFORE BREAKFAST, 2 TABLETS BEFORE LUNCH, AND 1 TABLET BEFORE SUPPER Januvia 100 mg daily  What recent interventions/DTPs have been made to improve glycemic control: No recent interventions  Have there been any recent hospitalizations or ED visits since last visit  with CPP? No recent hospital visits  Patient denies hypoglycemic symptoms  Patient denies hyperglycemic symptoms  How often are you checking your blood sugar? once daily  What are your blood sugars ranging?  Fasting: Patient states his blood sugar readings are between 120 - 170 fasting in the mornings.   During the week, how often does your blood glucose drop below 70? Patient denies any readings below 70.  Are you checking your feet daily/regularly? Patient states he is checking his feet regular. He denies any issues.   Adherence Review: Is the patient currently on a STATIN medication? Yes Is the patient currently on ACE/ARB medication? Yes Does the patient have >5 day gap between last estimated fill dates? No  Care Gaps: AWV - completed 06/08/2021 Last BP - 126/88 on 10/08/2021 Last A1C - 705 on 08/02/2021 Shingrix - never done Eye exam - over due Covid vaccine - overdue TDAP - overdue  Star Rating Drugs: Atorvastatin 20 mg  - last filled 08/12/2021 90 DS at [facility] Losartan 50 mg  - last filled 09/05/2021 90 DS at [facility] Repaglinide 2 mg - last filled 10/07/2021 90 DS at [facility] Januvia 100 mg - last filled 10/05/2021 30 DS at [facility] Metformin 1000 mg  - last filled 08/12/2021 90 DS at [place] [phone]

## 2021-11-04 ENCOUNTER — Other Ambulatory Visit: Payer: Self-pay | Admitting: Family Medicine

## 2021-12-06 ENCOUNTER — Telehealth: Payer: Self-pay | Admitting: Pharmacist

## 2021-12-06 NOTE — Chronic Care Management (AMB) (Signed)
° ° °  Chronic Care Management Pharmacy Assistant   Name: John Hobbs  MRN: [id number] DOB: September 14, 1946  Reason for Encounter: Reschedule appointment with [name]  Rescheduled with patient to 02/28/2022.  [place] Pharmacist Assistant [phone]

## 2021-12-07 ENCOUNTER — Other Ambulatory Visit: Payer: Self-pay | Admitting: Family Medicine

## 2021-12-07 DIAGNOSIS — I10 Essential (primary) hypertension: Secondary | ICD-10-CM

## 2021-12-10 ENCOUNTER — Other Ambulatory Visit: Payer: Self-pay | Admitting: Family Medicine

## 2021-12-10 DIAGNOSIS — I10 Essential (primary) hypertension: Secondary | ICD-10-CM

## 2021-12-29 ENCOUNTER — Ambulatory Visit (INDEPENDENT_AMBULATORY_CARE_PROVIDER_SITE_OTHER): Payer: HMO | Admitting: Family Medicine

## 2021-12-29 VITALS — BP 114/72 | HR 74 | Temp 97.9°F | Wt 231.0 lb

## 2021-12-29 DIAGNOSIS — I10 Essential (primary) hypertension: Secondary | ICD-10-CM | POA: Diagnosis not present

## 2021-12-29 DIAGNOSIS — E1149 Type 2 diabetes mellitus with other diabetic neurological complication: Secondary | ICD-10-CM

## 2021-12-29 LAB — POCT GLYCOSYLATED HEMOGLOBIN (HGB A1C): Hemoglobin A1C: 7.8 % — AB (ref 4.0–5.6)

## 2021-12-29 MED ORDER — GLIMEPIRIDE 1 MG PO TABS: 1.0000 mg | ORAL_TABLET | Freq: Every day | ORAL | 4 refills | Status: DC
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
## 2021-12-29 NOTE — Patient Instructions (Addendum)
A new prescription for glimepiride 1 mg with dinner was sent to your pharmacy. ? ?Continue taking your other medications for diabetes. ? ?Continue checking your blood sugar in the morning and keeping a log to bring with you to clinic.  If you begin to have low blood sugar readings. ? ?We will have you follow-up in 1-2 months. ?

## 2021-12-30 ENCOUNTER — Encounter: Payer: Self-pay | Admitting: Family Medicine

## 2022-01-11 ENCOUNTER — Encounter: Payer: Self-pay | Admitting: Family Medicine

## 2022-01-11 DIAGNOSIS — H40013 Open angle with borderline findings, low risk, bilateral: Secondary | ICD-10-CM | POA: Diagnosis not present

## 2022-01-11 DIAGNOSIS — E119 Type 2 diabetes mellitus without complications: Secondary | ICD-10-CM | POA: Diagnosis not present

## 2022-01-11 LAB — HM DIABETES EYE EXAM

## 2022-01-21 ENCOUNTER — Other Ambulatory Visit: Payer: Self-pay | Admitting: Family Medicine

## 2022-02-01 ENCOUNTER — Telehealth: Payer: HMO

## 2022-02-09 ENCOUNTER — Ambulatory Visit (INDEPENDENT_AMBULATORY_CARE_PROVIDER_SITE_OTHER): Payer: HMO | Admitting: Family Medicine

## 2022-02-09 ENCOUNTER — Encounter: Payer: Self-pay | Admitting: Family Medicine

## 2022-02-09 VITALS — BP 111/77 | HR 79 | Temp 98.1°F | Wt 230.2 lb

## 2022-02-09 DIAGNOSIS — I10 Essential (primary) hypertension: Secondary | ICD-10-CM

## 2022-02-09 DIAGNOSIS — M67479 Ganglion, unspecified ankle and foot: Secondary | ICD-10-CM | POA: Diagnosis not present

## 2022-02-09 DIAGNOSIS — E1149 Type 2 diabetes mellitus with other diabetic neurological complication: Secondary | ICD-10-CM | POA: Diagnosis not present

## 2022-02-09 MED ORDER — FREESTYLE LIBRE 3 SENSOR MISC: 1.0000 | 11 refills | Status: DC

## 2022-02-09 NOTE — Patient Instructions (Signed)
It is important that you continue checking your blood sugar daily.  Cut down on your intake of carbohydrates including bread, pasta, rice, potatoes.  For continued elevation in your blood sugars we will increase your dose of glimepiride. ?
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
## 2022-02-15 ENCOUNTER — Other Ambulatory Visit: Payer: Self-pay | Admitting: Family Medicine

## 2022-02-18 ENCOUNTER — Other Ambulatory Visit: Payer: Self-pay | Admitting: Family Medicine

## 2022-02-18 DIAGNOSIS — E1165 Type 2 diabetes mellitus with hyperglycemia: Secondary | ICD-10-CM

## 2022-02-25 ENCOUNTER — Telehealth: Payer: Self-pay | Admitting: Pharmacist

## 2022-02-25 NOTE — Chronic Care Management (AMB) (Signed)
? ? ?  Chronic Care Management ?Pharmacy Assistant  ? ?Name: John Hobbs  MRN: [id number] DOB: 1946/08/25 ? ?02/28/2022 APPOINTMENT REMINDER ? ?[name] Reach's wife was reminded to have all medications, supplements and any blood glucose and blood pressure readings available for review with [name], Pharm. D, at his telephone visit on 02/28/2022 at 3:30. ? ?Care Gaps: ?AWV - completed 06/08/2021 ?Last BP - 111/77 on 02/09/2022 ?Last A1C - 705 on 08/02/2021 ?Shingrix - never done ?Foot exam - over due ?Covid vaccine - overdue ?TDAP - overdue ?  ?Star Rating Drugs: ?Atorvastatin 20 mg  - last filled 02/19/2022 90 DS at [facility] ?Losartan 50 mg  - last filled 12/09/2021 90 DS at [facility] ?Repaglinide 2 mg - last filled Never picked up at [facility] ?Januvia 100 mg - last filled 04/19/202330 DS at [facility] ?Metformin 1000 mg  - last filled 11/17/2021 90 DS at [facility] ?Fill history verified with Tori ? ? ?Any gaps in medications fill history? yes ? ?Gennie Alma CMA  ?Clinical Pharmacist Assistant ?[phone] ? ?

## 2022-02-28 ENCOUNTER — Telehealth: Payer: Self-pay | Admitting: Pharmacist

## 2022-02-28 ENCOUNTER — Other Ambulatory Visit: Payer: Self-pay | Admitting: Family Medicine

## 2022-02-28 ENCOUNTER — Telehealth: Payer: HMO

## 2022-02-28 DIAGNOSIS — E1165 Type 2 diabetes mellitus with hyperglycemia: Secondary | ICD-10-CM

## 2022-02-28 NOTE — Telephone Encounter (Signed)
?  Chronic Care Management  ? ?Outreach Note ? ?02/28/2022 ?Name: John Hobbs MRN: [id number] DOB: [birth year] ? ?Referred by: Billie Ruddy, MD ? ?Patient had a phone appointment scheduled with clinical pharmacist today. ? ?An unsuccessful telephone outreach was attempted today. The patient was referred to the pharmacist for assistance with care management and care coordination.  ? ?If possible, a message was left to return call to: [phone] or to [facility] [place]: [phone] ? ?[name], PharmD, BCACP ?Clinical Pharmacist ?Therapist, music at [place] ?[phone] ? ? ?

## 2022-02-28 NOTE — Progress Notes (Deleted)
Chronic Care Management Pharmacy Note  02/28/2022 Name:  SAYAN ALDAVA MRN:  [id number] DOB:  [birth year]  Summary: A1c not at goal < 7%  Recommendations/Changes made from today's visit: -Recommend SGLT2 inhibitor for further A1c lowering (choice depending on approval for patient assistance) -Recommend repeat vitamin D level  Plan: Follow up DM assessment in 1 month Follow up on PAP application   Subjective: John Hobbs is an 76 y.o. year old male who is a primary patient of Billie Ruddy, MD.  The CCM team was consulted for assistance with disease management and care coordination needs.    Engaged with patient by telephone for follow up visit in response to provider referral for pharmacy case management and/or care coordination services.   Consent to Services:  The patient was given information about Chronic Care Management services, agreed to services, and gave verbal consent prior to initiation of services.  Please see initial visit note for detailed documentation.   Patient Care Team: Billie Ruddy, MD as PCP - General (Family Medicine) Ara Kussmaul, MD as Consulting Physician (Ophthalmology) Viona Gilmore, [facility] as Pharmacist (Pharmacist)  Recent office visits: 02/09/22 Grier Mitts, MD: Patient presented for foot injury.  Increased dose of glimepiride.  12/29/21 Grier Mitts, MD: Patient presented for DM follow up. Prescribed glimepiride 1 mg with dinner.  10/08/2021 Grier Mitts MD - Patient was seen for dizziness and additional issues. Started Allegra 180 mg daily. Changed Prandin to 2 mg - TAKE 2 TABLETS BY MOUTH DAILY BEFORE BREAKFAST, 2 TABLETS BEFORE LUNCH, AND 1 TABLET BEFORE SUPPER. Discontinued Loratadine.  Follow up in 1 month.  Recent consult visits: None in previous 6 months  Hospital visits: None in previous 6 months   Objective:  Lab Results  Component Value Date   CREATININE 1.00 03/31/2021   BUN 10 03/31/2021   GFR 75.19  03/31/2021   GFRNONAA >60 08/17/2019   GFRAA >60 08/17/2019   NA 139 03/31/2021   K 3.7 03/31/2021   CALCIUM 9.6 03/31/2021   CO2 25 03/31/2021   GLUCOSE 210 (H) 03/31/2021    Lab Results  Component Value Date/Time   HGBA1C 7.8 (A) 12/29/2021 09:23 AM   HGBA1C 7.5 (A) 08/02/2021 02:37 PM   HGBA1C 8.3 (H) 01/30/2018 08:11 AM   HGBA1C 7.5 (H) 05/04/2017 10:11 AM   FRUCTOSAMINE 334 (H) 07/11/2019 08:40 AM   GFR 75.19 03/31/2021 09:11 AM   GFR 76.63 09/18/2019 02:22 PM   MICROALBUR 1.9 12/21/2020 10:04 AM   MICROALBUR 0.7 12/22/2015 09:02 AM    Last diabetic Eye exam:  Lab Results  Component Value Date/Time   HMDIABEYEEXA No Retinopathy 01/11/2022 12:00 AM    Last diabetic Foot exam: No results found for: HMDIABFOOTEX   Lab Results  Component Value Date   CHOL 106 03/31/2021   HDL 34.20 (L) 03/31/2021   LDLCALC 57 03/31/2021   TRIG 74.0 03/31/2021   CHOLHDL 3 03/31/2021       Latest Ref Rng & Units 03/31/2021    9:11 AM 09/04/2019   12:23 PM 09/18/2018    8:20 AM  Hepatic Function  Total Protein 6.0 - 8.3 g/dL 7.4   7.4   7.0    Albumin 3.5 - 5.2 g/dL 4.4   4.0   4.1    AST 0 - 37 U/L _0 ALT 0 - 53 U/L _1 Alk Phosphatase 39 -  117 U/L 43   54   45    Total Bilirubin 0.2 - 1.2 mg/dL 0.7   0.9   0.5      Lab Results  Component Value Date/Time   TSH 2.26 04/24/2020 02:07 PM   TSH 2.64 09/04/2019 12:23 PM   FREET4 0.9 04/24/2020 02:07 PM   FREET4 0.92 09/04/2019 12:23 PM       Latest Ref Rng & Units 04/24/2020    2:07 PM 09/04/2019   12:23 PM 08/17/2019    2:48 PM  CBC  WBC 3.8 - 10.8 Thousand/uL 8.5   10.7   8.0    Hemoglobin 13.2 - 17.1 g/dL 14.0   13.9   14.5    Hematocrit 38.5 - 50.0 % 42.3   42.3   44.4    Platelets 140 - 400 Thousand/uL 280   297.0   295      Lab Results  Component Value Date/Time   VD25OH 28.72 (L) 09/04/2019 12:23 PM    Clinical ASCVD: No  The ASCVD Risk score (Arnett DK, et al., 2019) failed to  calculate for the following reasons:   The valid total cholesterol range is 130 to 320 mg/dL       06/08/2021    3:23 PM 06/08/2021    3:19 PM 01/22/2020    8:48 AM  Depression screen PHQ 2/9  Decreased Interest 0 0 0  Down, Depressed, Hopeless 0 0 0  PHQ - 2 Score 0 0 0  Altered sleeping   0  Tired, decreased energy   0  Change in appetite   0  Feeling bad or failure about yourself    0  Trouble concentrating   0  Moving slowly or fidgety/restless   0  Suicidal thoughts   0  PHQ-9 Score   0      Social History   Tobacco Use  Smoking Status Never  Smokeless Tobacco Never   BP Readings from Last 3 Encounters:  02/09/22 111/77  12/29/21 114/72  10/08/21 126/88   Pulse Readings from Last 3 Encounters:  02/09/22 79  12/29/21 74  10/08/21 78   Wt Readings from Last 3 Encounters:  02/09/22 230 lb 3.2 oz (104.4 kg)  12/29/21 231 lb (104.8 kg)  10/08/21 234 lb 9.6 oz (106.4 kg)   BMI Readings from Last 3 Encounters:  02/09/22 32.56 kg/m  12/29/21 32.68 kg/m  10/08/21 33.19 kg/m    Assessment/Interventions: Review of patient past medical history, allergies, medications, health status, including review of consultants reports, laboratory and other test data, was performed as part of comprehensive evaluation and provision of chronic care management services.   SDOH:  (Social Determinants of Health) assessments and interventions performed: No  SDOH Screenings   Alcohol Screen: Low Risk    Last Alcohol Screening Score (AUDIT): 1  Depression (PHQ2-9): Low Risk    PHQ-2 Score: 0  Financial Resource Strain: Low Risk    Difficulty of Paying Living Expenses: Not hard at all  Food Insecurity: No Food Insecurity   Worried About Charity fundraiser in the Last Year: Never true   Ran Out of Food in the Last Year: Never true  Housing: Low Risk    Last Housing Risk Score: 0  Physical Activity: Sufficiently Active   Days of Exercise per Week: 3 days   Minutes of Exercise per  Session: 60 min  Social Connections: Moderately Isolated   Frequency of Communication with Friends and Family: Twice a week  Frequency of Social Gatherings with Friends and Family: Twice a week   Attends Religious Services: Never   Marine scientist or Organizations: No   Attends Music therapist: Never   Marital Status: Married  Stress: No Stress Concern Present   Feeling of Stress : Not at all  Tobacco Use: Low Risk    Smoking Tobacco Use: Never   Smokeless Tobacco Use: Never   Passive Exposure: Not on file  Transportation Needs: No Transportation Needs   Lack of Transportation (Medical): No   Lack of Transportation (Non-Medical): No    CCM Care Plan  Allergies  Allergen Reactions   Lisinopril Cough    Stopped in 2014.    Medications Reviewed Today     Reviewed by Billie Ruddy, MD (Physician) on 02/09/22 at 1220  Med List Status: <None>   Medication Order Taking? Sig Documenting Provider Last Dose Status Informant  amLODipine (NORVASC) 5 MG tablet [id number] Yes TAKE 1 TABLET BY MOUTH DAILY Billie Ruddy, MD Taking Active   aspirin EC 81 MG tablet [id number] Yes Take 81 mg by mouth daily after breakfast. [provider] Taking Active Self  atenolol-chlorthalidone (TENORETIC) 100-25 MG tablet [id number] Yes TAKE 1/2 TABLET BY MOUTH DAILY Billie Ruddy, MD Taking Active   atorvastatin (LIPITOR) 20 MG tablet [id number] Yes TAKE 1 TABLET BY MOUTH EVERY DAY Billie Ruddy, MD Taking Active   Continuous Blood Gluc Sensor (FREESTYLE LIBRE 3 SENSOR) [place] [id number] Yes 1 Device by Does not apply route every 14 (fourteen) days. Place 1 sensor on the skin every 14 days. Use to check glucose continuously Billie Ruddy, MD  Active   fexofenadine [facility]) 180 MG tablet [id number] Yes Take 1 tablet (180 mg total) by mouth daily. Billie Ruddy, MD Taking Active   glimepiride (AMARYL) 1 MG tablet [id number] Yes Take 1 tablet (1 mg total) by mouth daily  with supper. Billie Ruddy, MD Taking Active   glucose blood test strip [id number] Yes Use as instructed for checking fsbs TID. Billie Ruddy, MD Taking Active   JANUVIA 100 MG tablet [id number] Yes TAKE 1 TABLET(100 MG) BY MOUTH DAILY Billie Ruddy, MD Taking Active   Lancets [facility] ULTRASOFT) lancets [id number] Yes Use to check blood sugar once daily Billie Ruddy, MD Taking Active   losartan (COZAAR) 50 MG tablet [id number] Yes TAKE 1 TABLET(50 MG) BY MOUTH DAILY Billie Ruddy, MD Taking Active   metFORMIN (GLUCOPHAGE) 1000 MG tablet [id number] Yes Take 1 tablet (1,000 mg total) by mouth 2 (two) times daily with a meal. Billie Ruddy, MD Taking Active   ONE [facility] LANCETS MISC [id number] Yes Test once day Lucretia Kern, DO Taking Active Self  [facility] VERIO test strip [id number] Yes USE AS DIRECTED TO TEST BLOOD SUGAR ONCE DAILY Billie Ruddy, MD Taking Active   potassium chloride SA (KLOR-CON) 20 MEQ tablet [id number] Yes TAKE 1/2 TABLET(10 MEQ) BY MOUTH DAILY Billie Ruddy, MD Taking Active   repaglinide (PRANDIN) 2 MG tablet [id number] Yes TAKE 2 TABLETS BY MOUTH DAILY BEFORE BREAKFAST, 2 TABLETS BEFORE LUNCH, AND 1 TABLET BEFORE SUPPER Billie Ruddy, MD Taking Active   repaglinide (PRANDIN) 2 MG tablet [id number] Yes Take 2 tabs (30m) before breakfast, 2 tabs (4 mg) before lunch, and 3 tabs (6 mg) before dinner. BBillie Ruddy MD Taking Active   traMADol (Veatrice Bourbon 50 MG tablet [id number] Take 1 tablet (50 mg total) by mouth  every 6 (six) hours as needed. Pete Pelt, PA-C Taking Active   triamcinolone (NASACORT) 55 MCG/ACT AERO nasal inhaler [id number] Yes Place 2 sprays into the nose daily as needed (congestion). [provider] Taking Active             Patient Active Problem List   Diagnosis Date Noted   Allergic rhinitis    Type 2 diabetes mellitus with hyperglycemia, without long-term current use of insulin ([place]) 10/10/2019   Unilateral primary  osteoarthritis, right knee 05/13/2019   Status post arthroscopy of right knee 12/19/2018   Chronic left shoulder pain 03/13/2018   Impingement syndrome of left shoulder 03/13/2018   Morbid obesity ([place]) 09/21/2017   Hyperlipidemia associated with type 2 diabetes mellitus ([place]) 01/24/2017   Acute medial meniscal tear 08/10/2015   NASH (nonalcoholic steatohepatitis) 07/28/2015   Type 2 diabetes mellitus with neurological manifestations, uncontrolled 12/30/2013   Hypertension associated with diabetes ([place]) 07/02/2008    Immunization History  Administered Date(s) Administered   Fluad Quad(high Dose 65+) 07/02/2020, 08/02/2021   Influenza Split 08/29/2011, 11/19/2012   Influenza Whole 08/22/2007, 01/07/2010   Influenza, High Dose Seasonal PF 07/28/2015, 09/23/2016, 09/21/2017, 09/18/2018, 07/09/2019   Influenza,inj,Quad PF,6+ Mos 06/24/2013, 07/04/2014   PFIZER(Purple Top)SARS-COV-2 Vaccination 12/09/2019, 12/30/2019, 07/24/2020   Pneumococcal Conjugate-13 02/18/2015   Pneumococcal Polysaccharide-23 01/07/2010, 11/22/2013   Tdap 08/29/2011   Zoster, Live 11/22/2013   Patient reports that his diabetes medications are expensive and may be in the donut hole. He also has not noticed a difference in his blood sugars since the dose increase of repaglinide.  Conditions to be addressed/monitored:  Hypertension, Hyperlipidemia, Diabetes, Allergic Rhinitis, and Vitamin D deficiency  Conditions addressed this visit: Diabetes, hypertension  There are no care plans that you recently modified to display for this patient.       Medication Assistance: Application for SGLT  medication assistance program. in process.  Anticipated assistance start date 06/17/21.  See plan of care for additional detail.  Compliance/Adherence/Medication fill history: Care Gaps: Shingrix, eye exam, COVID booster, tetanus Last A1c 7.8% (12/29/21) Last BP - 111/77 on 02/09/2022  Star-Rating Drugs: Atorvastatin 20 mg   - last filled 02/19/2022 90 DS at [facility] Losartan 50 mg  - last filled 12/09/2021 90 DS at [facility] Repaglinide 2 mg - last filled Never picked up at [place] 100 mg - last filled 04/19/202330 DS at [facility] Metformin 1000 mg  - last filled 11/17/2021 90 DS at [facility]  Patient's preferred pharmacy is:  [facility] [place], [place] [address] Phone: [phone] Fax: [phone]  [place] - [address] [place] [address] Phone: [phone] Fax: [phone]   Uses pill box? Yes Pt endorses 99% compliance   We discussed: Current pharmacy is preferred with insurance plan and patient is satisfied with pharmacy services Patient decided to: Continue current medication management strategy  Care Plan and Follow Up Patient Decision:  Patient agrees to Care Plan and Follow-up.  Plan: Telephone follow up appointment with care management team member scheduled for:  4 months  [name], PharmD, [place] Pharmacist [place] at [place] [phone]

## 2022-03-04 ENCOUNTER — Ambulatory Visit (INDEPENDENT_AMBULATORY_CARE_PROVIDER_SITE_OTHER): Payer: HMO | Admitting: Family Medicine

## 2022-03-04 ENCOUNTER — Telehealth: Payer: Self-pay

## 2022-03-04 VITALS — BP 130/84 | HR 64 | Temp 97.9°F | Ht 70.5 in | Wt 232.4 lb

## 2022-03-04 DIAGNOSIS — E1140 Type 2 diabetes mellitus with diabetic neuropathy, unspecified: Secondary | ICD-10-CM

## 2022-03-04 DIAGNOSIS — I10 Essential (primary) hypertension: Secondary | ICD-10-CM

## 2022-03-04 DIAGNOSIS — E1165 Type 2 diabetes mellitus with hyperglycemia: Secondary | ICD-10-CM

## 2022-03-04 DIAGNOSIS — E1159 Type 2 diabetes mellitus with other circulatory complications: Secondary | ICD-10-CM

## 2022-03-04 LAB — POCT URINALYSIS DIPSTICK
Bilirubin, UA: NEGATIVE
Blood, UA: NEGATIVE
Glucose, UA: NEGATIVE
Ketones, UA: NEGATIVE
Leukocytes, UA: NEGATIVE
Nitrite, UA: NEGATIVE
Protein, UA: NEGATIVE
Spec Grav, UA: 1.015 (ref 1.010–1.025)
Urobilinogen, UA: 1 E.U./dL
pH, UA: 6 (ref 5.0–8.0)

## 2022-03-04 LAB — POCT GLUCOSE (DEVICE FOR HOME USE): Glucose Fasting, POC: 168 mg/dL — AB (ref 70–99)

## 2022-03-04 NOTE — Telephone Encounter (Signed)
---  His blood sugar was 374. It is now 331. He has been having issues with his blood sugar being high. He denies any other s+s. this morning.  03/04/2022 9:09:13 AM Call PCP Now Hardin Negus, RN, Mardene Celeste  Comments User: Ledora Bottcher, RN Date/Time Eilene Ghazi Time): 03/04/2022 9:10:05 AM attempted to warm transfer to the backlin. I did not get an answer  Referrals REFERRED TO PCP OFFICE

## 2022-03-04 NOTE — Progress Notes (Signed)
   John Hobbs is a 76 y.o. male who presents today for an office visit.  Assessment/Plan:  T2DM Patient concerned about elevated readings at home in the 300s however our point-of-care glucose machine today was reading 168 versus 280 from his Dexcom.  Additionally we obtained a point-of-care urinalysis that did not show any glucosuria.  Discussed with patient that his current Dexcom sensor is likely not functioning properly and he is getting falsely elevated readings.  Does not have any overt signs of hyperglycemia today either.  We will continue his current medication regimen and advised him to replace his Dexcom sensor.  He will follow-up with his PCP early next week if this continues to be an issue.  We discussed reasons to return to care.    Hypertension At goal on losartan 50 mg daily, atenolol-chlorthalidone 100-25 half tablet daily.     Subjective:  HPI:  Patient here with concerns for hyperglycemia.  He put on a new Dexcom sensor week ago and sugars have been reading elevated into the 300s for the last few days.  It is felt all over the last couple of days.  He has not noticed any excessive thirst or excessive urination.  No vision changes.  No headache.  He has been compliant with all of his diabetes medications without any missed doses.  No fevers or chills.       Objective:  Physical Exam: BP 130/84 (BP Location: Left Arm)   Pulse 64   Temp 97.9 F (36.6 C) (Temporal)   Ht 5' 10.5" (1.791 m)   Wt 232 lb 6.4 oz (105.4 kg)   SpO2 98%   BMI 32.87 kg/m   Gen: No acute distress, resting comfortably CV: Regular rate and rhythm with no murmurs appreciated Pulm: Normal work of breathing, clear to auscultation bilaterally with no crackles, wheezes, or rhonchi Neuro: Grossly normal, moves all extremities Psych: Normal affect and thought content      John Hobbs M. Jerline Pain, MD 03/04/2022 12:24 PM

## 2022-03-04 NOTE — Patient Instructions (Signed)
It was very nice to see you today!  Your glucose monitor is not accurate.  Your glucose today is 168 and you do not have any glucose in your urine.  We will check blood work.  Please get a new glucose monitor and let us know if it is persistently elevated.  Take care, Dr Jerline Pain  PLEASE NOTE:  If you had any lab tests please let us know if you have not heard back within a few days. You may see your results on mychart before we have a chance to review them but we will give you a call once they are reviewed by [place]. If we ordered any referrals today, please let us know if you have not heard from their office within the next week.   Please try these tips to maintain a healthy lifestyle:  Eat at least 3 REAL meals and 1-2 snacks per day.  Aim for no more than 5 hours between eating.  If you eat breakfast, please do so within one hour of getting up.   Each meal should contain half fruits/vegetables, one quarter protein, and one quarter carbs (no bigger than a computer mouse)  Cut down on sweet beverages. This includes juice, soda, and sweet tea.   Drink at least 1 glass of water with each meal and aim for at least 8 glasses per day  Exercise at least 150 minutes every week.

## 2022-03-23 ENCOUNTER — Encounter: Payer: Self-pay | Admitting: Family Medicine

## 2022-03-23 ENCOUNTER — Ambulatory Visit (INDEPENDENT_AMBULATORY_CARE_PROVIDER_SITE_OTHER): Payer: HMO | Admitting: Family Medicine

## 2022-03-23 VITALS — BP 122/86 | HR 86 | Temp 98.1°F | Wt 232.8 lb

## 2022-03-23 DIAGNOSIS — I10 Essential (primary) hypertension: Secondary | ICD-10-CM | POA: Diagnosis not present

## 2022-03-23 DIAGNOSIS — E1140 Type 2 diabetes mellitus with diabetic neuropathy, unspecified: Secondary | ICD-10-CM | POA: Diagnosis not present

## 2022-03-23 LAB — GLUCOSE, POCT (MANUAL RESULT ENTRY): POC Glucose: 245 mg/dl — AB (ref 70–99)
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
## 2022-03-23 NOTE — Patient Instructions (Addendum)
Your blood sugar this morning was 245.  As it has been 2 hours since she had breakfast it should still not be at this Ivor Reining Dx with DM in 2008.  Bromocriptine caused nausea and dizziness high.  Given your liable blood sugars I have placed a referral to continue to the endocrinologist for additional input.  I have requested that the referral be sent to a different clinic than the one you previously saw.  You should expect a phone call about scheduling this appointment.

## 2022-03-31 DIAGNOSIS — I10 Essential (primary) hypertension: Secondary | ICD-10-CM | POA: Diagnosis not present

## 2022-03-31 DIAGNOSIS — E7800 Pure hypercholesterolemia, unspecified: Secondary | ICD-10-CM | POA: Diagnosis not present

## 2022-03-31 DIAGNOSIS — E1165 Type 2 diabetes mellitus with hyperglycemia: Secondary | ICD-10-CM | POA: Diagnosis not present

## 2022-04-06 DIAGNOSIS — E7800 Pure hypercholesterolemia, unspecified: Secondary | ICD-10-CM | POA: Diagnosis not present

## 2022-04-06 DIAGNOSIS — E1165 Type 2 diabetes mellitus with hyperglycemia: Secondary | ICD-10-CM | POA: Diagnosis not present

## 2022-04-06 DIAGNOSIS — I10 Essential (primary) hypertension: Secondary | ICD-10-CM | POA: Diagnosis not present

## 2022-04-22 ENCOUNTER — Ambulatory Visit (INDEPENDENT_AMBULATORY_CARE_PROVIDER_SITE_OTHER): Payer: HMO | Admitting: Family Medicine

## 2022-04-22 VITALS — BP 102/90 | HR 70 | Temp 98.1°F | Wt 224.6 lb

## 2022-04-22 DIAGNOSIS — I10 Essential (primary) hypertension: Secondary | ICD-10-CM

## 2022-04-22 DIAGNOSIS — E1140 Type 2 diabetes mellitus with diabetic neuropathy, unspecified: Secondary | ICD-10-CM | POA: Diagnosis not present

## 2022-04-22 MED ORDER — SEMAGLUTIDE(0.25 OR 0.5MG/DOS) 2 MG/3ML ~~LOC~~ SOPN: 0.2500 mg | PEN_INJECTOR | SUBCUTANEOUS | 2 refills | Status: DC

## 2022-04-22 NOTE — Patient Instructions (Addendum)
Stop taking Jardiance 25 mg daily.  A prescription for Ozempic 0.25 mg weekly was sent to your pharmacy.  This is the injection you gave yourself in clinic.  Your next dose would be due next Friday 7/14.  Continue taking your other medications for diabetes.  Is important that you monitor blood sugar closely for hypoglycemia.  Please reach out to the endocrinologist office to let them know your blood sugars been elevated since starting the Jardiance and inform them of the current plans.
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
## 2022-04-26 DIAGNOSIS — E7800 Pure hypercholesterolemia, unspecified: Secondary | ICD-10-CM | POA: Diagnosis not present

## 2022-04-26 DIAGNOSIS — I10 Essential (primary) hypertension: Secondary | ICD-10-CM | POA: Diagnosis not present

## 2022-04-26 DIAGNOSIS — E162 Hypoglycemia, unspecified: Secondary | ICD-10-CM | POA: Diagnosis not present

## 2022-04-26 DIAGNOSIS — E1165 Type 2 diabetes mellitus with hyperglycemia: Secondary | ICD-10-CM | POA: Diagnosis not present

## 2022-04-29 ENCOUNTER — Encounter: Payer: Self-pay | Admitting: Family Medicine

## 2022-05-20 ENCOUNTER — Telehealth: Payer: Self-pay | Admitting: Pharmacist

## 2022-05-20 NOTE — Chronic Care Management (AMB) (Signed)
Chronic Care Management Pharmacy Assistant   Name: John Hobbs  MRN: [id number] DOB: [birth year]  Reason for Encounter: Disease State / Diabetes Assessment Call   Conditions to be addressed/monitored: DMII  Recent office visits:  04/22/2022 Grier Mitts MD - Patient was seen for Type 2 diabetes mellitus with diabetic neuropathy, without long-term current use of insulin and an additional issue. Started Semaglutide 0.25 mg weekly. Follow up in 1 month   03/23/2022 Grier Mitts MD - Patient was seen for Type 2 diabetes mellitus with diabetic neuropathy, without long-term current use of insulin and an additional issue. Referral to Endocrinology. No medication changes. Follow up in 1 month.   03/04/2022 Dimas Chyle MD - Patient was seen for Patient was seen for Type 2 diabetes mellitus with diabetic neuropathy, without long-term current use of insulin and an additional issue.No medication changes. No follow up noted.   Recent consult visits:  04/26/2022 [place] - Patient was seen for Type 2 diabetes mellitus with hyperglycemia and additional issues. No additional chart notes.   04/06/2022 [place] - Patient was seen for Type 2 diabetes mellitus with hyperglycemia and additional issues. No additional chart notes.   03/31/2022 [place] - Patient was seen for Type 2 diabetes mellitus with hyperglycemia and additional issues. No additional chart notes.   Hospital visits:  None  Medications: Outpatient Encounter Medications as of 05/20/2022  Medication Sig   amLODipine (NORVASC) 5 MG tablet TAKE 1 TABLET BY MOUTH DAILY   aspirin EC 81 MG tablet Take 81 mg by mouth daily after breakfast.   atenolol-chlorthalidone (TENORETIC) 100-25 MG tablet TAKE 1/2 TABLET BY MOUTH DAILY   atorvastatin (LIPITOR) 20 MG tablet TAKE 1 TABLET BY MOUTH EVERY DAY   Continuous Blood Gluc Sensor (FREESTYLE LIBRE 3  SENSOR) MISC 1 Device by Does not apply route every 14 (fourteen) days. Place 1 sensor on the skin every 14 days. Use to check glucose continuously   empagliflozin (JARDIANCE) 25 MG TABS tablet 1 tablet   fexofenadine (ALLEGRA) 180 MG tablet Take 1 tablet (180 mg total) by mouth daily.   glimepiride (AMARYL) 1 MG tablet Take 1 tablet (1 mg total) by mouth daily with supper.   glucose blood test strip Use as instructed for checking fsbs TID.   JANUVIA 100 MG tablet TAKE 1 TABLET(100 MG) BY MOUTH DAILY   Lancets (ONETOUCH ULTRASOFT) lancets Use to check blood sugar once daily   losartan (COZAAR) 50 MG tablet TAKE 1 TABLET(50 MG) BY MOUTH DAILY   metFORMIN (GLUCOPHAGE) 1000 MG tablet TAKE 1 TABLET(1000 MG) BY MOUTH TWICE DAILY WITH A MEAL   ONE TOUCH LANCETS MISC Test once day   ONETOUCH VERIO test strip USE AS DIRECTED TO TEST BLOOD SUGAR ONCE DAILY   potassium chloride SA (KLOR-CON) 20 MEQ tablet TAKE 1/2 TABLET(10 MEQ) BY MOUTH DAILY   repaglinide (PRANDIN) 2 MG tablet Take 2 tabs (12m) before breakfast, 2 tabs (4 mg) before lunch, and 3 tabs (6 mg) before dinner.   repaglinide (PRANDIN) 2 MG tablet TAKE 2 TABLETS BY MOUTH DAILY BEFORE BREAKFAST, 2 TABLETS BEFORE LUNCH, AND 1 TABLET BEFORE DINNER   Semaglutide,0.25 or 0.5MG/DOS, 2 MG/3ML SOPN Inject 0.25 mg into the skin once a week.   traMADol (ULTRAM) 50 MG tablet Take 1 tablet (50 mg total) by mouth every 6 (six) hours as needed.   triamcinolone (NASACORT) 55 MCG/ACT AERO nasal inhaler Place  2 sprays into the nose daily as needed (congestion).   No facility-administered encounter medications on file as of 05/20/2022.  Fill History: TRAMADOL 50MG TABLETS 12/30/2020 7   JANUVIA 100 MG PO TABS 02/01/2022 90   repaglinide (PRANDIN) tablet 02/28/2022 90   potassium chloride SA (K-DUR,KLOR-CON) CR tablet 01/06/2022 90   metFORMIN (GLUCOPHAGE) tablet 02/19/2022 90   losartan (COZAAR) tablet 03/07/2022 90   glimepiride (AMARYL) tablet  03/17/2022 30   JARDIANCE 25 MG PO TABS 03/31/2022 30   atorvastatin (LIPITOR) tablet 02/15/2022 90   atenolol-chlorthalidone (TENORETIC) tablet 100-25 mg 03/07/2022 90   amLODIpine (NORVASC) tablet 01/10/2022 90   Recent Relevant Labs: Lab Results  Component Value Date/Time   HGBA1C 7.8 (A) 12/29/2021 09:23 AM   HGBA1C 7.5 (A) 08/02/2021 02:37 PM   HGBA1C 8.3 (H) 01/30/2018 08:11 AM   HGBA1C 7.5 (H) 05/04/2017 10:11 AM   MICROALBUR 1.9 12/21/2020 10:04 AM   MICROALBUR 0.7 12/22/2015 09:02 AM    Kidney Function Lab Results  Component Value Date/Time   CREATININE 1.00 03/31/2021 09:11 AM   CREATININE 1.13 04/24/2020 02:07 PM   CREATININE 1.14 09/18/2019 02:22 PM   CREATININE 1.01 10/22/2013 10:11 AM   GFR 75.19 03/31/2021 09:11 AM   GFRNONAA >60 08/17/2019 02:48 PM   GFRNONAA 78 10/22/2013 10:11 AM   GFRAA >60 08/17/2019 02:48 PM   GFRAA >89 10/22/2013 10:11 AM   Current antihyperglycemic regimen:  Jardiance 25 mg daily Glimepiride 1 mg daily Januvia 100 mg daily Metformin 1000 mg twice daily Repaglinide 2 mg - 2 tabs before breakfast, 2 tabs before lunch, and 3 tabs before dinner. Semaglutide 0.25 mg inject 0.25 mg weekly What recent interventions/DTPs have been made to improve glycemic control:  On 04/22/2022 Started Semaglutide 0.25 mg weekly Have there been any recent hospitalizations or ED visits since last visit with CPP? No recent hospital visits.  Patient  hypoglycemic symptoms, including  Patient  hyperglycemic symptoms, including  How often are you checking your blood sugar?  What are your blood sugars ranging?  During the week, how often does your blood glucose drop below 70?  Are you checking your feet daily/regularly?   Adherence Review: Is the patient currently on a STATIN medication? Yes Is the patient currently on ACE/ARB medication? Yes Does the patient have >5 day gap between last estimated fill dates? No  Spoke with patient, he states he just  saw Dr. Volanda Napoleon this morning and she followed up his blood sugar medications and readings with him. Patient declined assessment due to this.  His follow up appointment with [name], Clinical pharmacist was schedule in Jan 2024 as his next visit with Dr. Volanda Napoleon is in Nov. 2023.  Care Gaps: AWV - message to Ramond Craver Last BP - 102/70 on 04/22/2022 Last A1C - 7.8 on 12/29/2021 Covid booster - overdue Foot exam - overdue Lipid panel - overdue Flu - due Shingrix - postponed Tdap - postponed  Star Rating Drugs: Atorvastatin 20 mg  - last filled 02/15/2022 90 [facility] at [facility] Losartan 50 mg  - last filled 03/07/2022 90 DS at [facility] Repaglinide 2 mg - last filled 02/28/2022 90 DS at [facility] Januvia 100 mg - last filled 02/01/2022 90 DS at [facility] verified with [facility] Metformin 1000 mg  - last filled 02/19/2022 90 DS at [facility] Glimepiride 1 mg - last filled 05/03/2022 30 DS at [facility] verified with Asham Jardiance 25 mg - last filled 05/01/2022 30 DS at [facility] verified with Asham Semaglutide 0.25 mg -  no fill history  [place]  Catering manager [phone]

## 2022-05-23 ENCOUNTER — Ambulatory Visit (INDEPENDENT_AMBULATORY_CARE_PROVIDER_SITE_OTHER): Payer: HMO | Admitting: Family Medicine

## 2022-05-23 VITALS — BP 124/84 | HR 79 | Temp 98.6°F | Wt 220.0 lb

## 2022-05-23 DIAGNOSIS — I10 Essential (primary) hypertension: Secondary | ICD-10-CM | POA: Diagnosis not present

## 2022-05-23 DIAGNOSIS — E782 Mixed hyperlipidemia: Secondary | ICD-10-CM | POA: Diagnosis not present

## 2022-05-23 DIAGNOSIS — E1165 Type 2 diabetes mellitus with hyperglycemia: Secondary | ICD-10-CM | POA: Diagnosis not present

## 2022-05-23 LAB — POCT GLYCOSYLATED HEMOGLOBIN (HGB A1C): Hemoglobin A1C: 7.6 % — AB (ref 4.0–5.6)
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
## 2022-05-24 DIAGNOSIS — E1165 Type 2 diabetes mellitus with hyperglycemia: Secondary | ICD-10-CM | POA: Diagnosis not present

## 2022-05-24 DIAGNOSIS — I779 Disorder of arteries and arterioles, unspecified: Secondary | ICD-10-CM | POA: Diagnosis not present

## 2022-05-24 DIAGNOSIS — E1139 Type 2 diabetes mellitus with other diabetic ophthalmic complication: Secondary | ICD-10-CM | POA: Diagnosis not present

## 2022-05-24 DIAGNOSIS — E785 Hyperlipidemia, unspecified: Secondary | ICD-10-CM | POA: Diagnosis not present

## 2022-05-24 DIAGNOSIS — E1169 Type 2 diabetes mellitus with other specified complication: Secondary | ICD-10-CM | POA: Diagnosis not present

## 2022-05-24 DIAGNOSIS — I10 Essential (primary) hypertension: Secondary | ICD-10-CM | POA: Diagnosis not present

## 2022-05-24 DIAGNOSIS — E1142 Type 2 diabetes mellitus with diabetic polyneuropathy: Secondary | ICD-10-CM | POA: Diagnosis not present

## 2022-05-24 DIAGNOSIS — J309 Allergic rhinitis, unspecified: Secondary | ICD-10-CM | POA: Diagnosis not present

## 2022-05-24 DIAGNOSIS — E039 Hypothyroidism, unspecified: Secondary | ICD-10-CM | POA: Diagnosis not present

## 2022-05-24 DIAGNOSIS — E669 Obesity, unspecified: Secondary | ICD-10-CM | POA: Diagnosis not present

## 2022-05-24 DIAGNOSIS — I2510 Atherosclerotic heart disease of native coronary artery without angina pectoris: Secondary | ICD-10-CM | POA: Diagnosis not present

## 2022-05-24 DIAGNOSIS — E876 Hypokalemia: Secondary | ICD-10-CM | POA: Diagnosis not present

## 2022-05-31 ENCOUNTER — Other Ambulatory Visit: Payer: Self-pay | Admitting: Family Medicine

## 2022-05-31 DIAGNOSIS — E1149 Type 2 diabetes mellitus with other diabetic neurological complication: Secondary | ICD-10-CM

## 2022-06-07 ENCOUNTER — Encounter: Payer: Self-pay | Admitting: Family Medicine

## 2022-06-08 ENCOUNTER — Other Ambulatory Visit: Payer: Self-pay | Admitting: Family Medicine

## 2022-06-08 DIAGNOSIS — I10 Essential (primary) hypertension: Secondary | ICD-10-CM

## 2022-06-09 ENCOUNTER — Ambulatory Visit (INDEPENDENT_AMBULATORY_CARE_PROVIDER_SITE_OTHER): Payer: HMO

## 2022-06-09 VITALS — BP 118/62 | HR 73 | Temp 97.3°F | Ht 70.0 in | Wt 218.1 lb

## 2022-06-09 DIAGNOSIS — Z0000 Encounter for general adult medical examination without abnormal findings: Secondary | ICD-10-CM | POA: Diagnosis not present
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
## 2022-06-09 NOTE — Patient Instructions (Addendum)
[name] , Thank you for taking time to come for your Medicare Wellness Visit. I appreciate your ongoing commitment to your health goals. Please review the following plan we discussed and let me know if I can assist you in the future.   These are the goals we discussed:  Goals       Blood Pressure < 140/90      HEMOGLOBIN A1C < 7.0      LDL CALC < 100      No current goals (pt-stated)      Weight (lb) < 200 lb (90.7 kg)      Keep Eat less but well. Try to incorporate some of the information in the [facility] plan given, as sodium interferes with weight loss         This is a list of the screening recommended for you and due dates:  Health Maintenance  Topic Date Due   Complete foot exam   02/17/2022   Lipid (cholesterol) test  03/31/2022   Flu Shot  05/17/2022   COVID-19 Vaccine (4 - Pfizer series) 06/25/2022*   Zoster (Shingles) Vaccine (1 of 2) 09/09/2022*   Tetanus Vaccine  03/05/2023*   Hemoglobin A1C  11/23/2022   Eye exam for diabetics  01/12/2023   Colon Cancer Screening  02/23/2026   Pneumonia Vaccine  Completed   Hepatitis C Screening: USPSTF Recommendation to screen - Ages 82-79 yo.  Addressed   HPV Vaccine  Aged Out  *Topic was postponed. The date shown is not the original due date.    Opioid Pain Medicine Management Opioids are powerful medicines that are used to treat moderate to severe pain. When used for short periods of time, they can help you to: Sleep better. Do better in physical or occupational therapy. Feel better in the first few days after an injury. Recover from surgery. Opioids should be taken with the supervision of a trained health care provider. They should be taken for the shortest period of time possible. This is because opioids can be addictive, and the longer you take opioids, the greater your risk of addiction. This addiction can also be called opioid use disorder. What are the risks? Using opioid pain medicines for longer than 3 days  increases your risk of side effects. Side effects include: Constipation. Nausea and vomiting. Breathing difficulties (respiratory depression). Drowsiness. Confusion. Opioid use disorder. Itching. Taking opioid pain medicine for a long period of time can affect your ability to do daily tasks. It also puts you at risk for: Motor vehicle crashes. Depression. Suicide. Heart attack. Overdose, which can be life-threatening. What is a pain treatment plan? A pain treatment plan is an agreement between you and your health care provider. Pain is unique to each person, and treatments vary depending on your condition. To manage your pain, you and your health care provider need to work together. To help you do this: Discuss the goals of your treatment, including how much pain you might expect to have and how you will manage the pain. Review the risks and benefits of taking opioid medicines. Remember that a good treatment plan uses more than one approach and minimizes the chance of side effects. Be honest about the amount of medicines you take and about any drug or alcohol use. Get pain medicine prescriptions from only one health care provider. Pain can be managed with many types of alternative treatments. Ask your health care provider to refer you to one or more specialists who can help you manage  pain through: Physical or occupational therapy. Counseling (cognitive behavioral therapy). Good nutrition. Biofeedback. Massage. Meditation. Non-opioid medicine. Following a gentle exercise program. How to use opioid pain medicine Taking medicine Take your pain medicine exactly as told by your health care provider. Take it only when you need it. If your pain gets less severe, you may take less than your prescribed dose if your health care provider approves. If you are not having pain, do nottake pain medicine unless your health care provider tells you to take it. If your pain is severe, do nottry to  treat it yourself by taking more pills than instructed on your prescription. Contact your health care provider for help. Write down the times when you take your pain medicine. It is easy to become confused while on pain medicine. Writing the time can help you avoid overdose. Take other over-the-counter or prescription medicines only as told by your health care provider. Keeping yourself and others safe  While you are taking opioid pain medicine: Do not drive, use machinery, or power tools. Do not sign legal documents. Do not drink alcohol. Do not take sleeping pills. Do not supervise children by yourself. Do not do activities that require climbing or being in high places. Do not go to a lake, river, ocean, spa, or swimming pool. Do not share your pain medicine with anyone. Keep pain medicine in a locked cabinet or in a secure area where pets and children cannot reach it. Stopping your use of opioids If you have been taking opioid medicine for more than a few weeks, you may need to slowly decrease (taper) how much you take until you stop completely. Tapering your use of opioids can decrease your risk of symptoms of withdrawal, such as: Pain and cramping in the abdomen. Nausea. Sweating. Sleepiness. Restlessness. Uncontrollable shaking (tremors). Cravings for the medicine. Do not attempt to taper your use of opioids on your own. Talk with your health care provider about how to do this. Your health care provider may prescribe a step-down schedule based on how much medicine you are taking and how long you have been taking it. Getting rid of leftover pills Do not save any leftover pills. Get rid of leftover pills safely by: Taking the medicine to a prescription take-back program. This is usually offered by the county or law enforcement. Bringing them to a pharmacy that has a drug disposal container. Flushing them down the toilet. Check the label or package insert of your medicine to see  whether this is safe to do. Throwing them out in the trash. Check the label or package insert of your medicine to see whether this is safe to do. If it is safe to throw it out, remove the medicine from the original container, put it into a sealable bag or container, and mix it with used coffee grounds, food scraps, dirt, or cat litter before putting it in the trash. Follow these instructions at home: Activity Do exercises as told by your health care provider. Avoid activities that make your pain worse. Return to your normal activities as told by your health care provider. Ask your health care provider what activities are safe for you. General instructions You may need to take these actions to prevent or treat constipation: Drink enough fluid to keep your urine pale yellow. Take over-the-counter or prescription medicines. Eat foods that are high in fiber, such as beans, whole grains, and fresh fruits and vegetables. Limit foods that are high in fat and processed sugars, such  as fried or sweet foods. Keep all follow-up visits. This is important. Where to find support If you have been taking opioids for a long time, you may benefit from receiving support for quitting from a local support group or counselor. Ask your health care provider for a referral to these resources in your area. Where to find more information Centers for Disease Control and Prevention (CDC): http://www.wolf.info/ [place] Food and Drug Administration (FDA): [place].[employer] Get help right away if: You may have taken too much of an opioid (overdosed). Common symptoms of an overdose: Your breathing is slower or more shallow than normal. You have a very slow heartbeat (pulse). You have slurred speech. You have nausea and vomiting. Your pupils become very small. You have other potential symptoms: You are very confused. You faint or feel like you will faint. You have cold, clammy skin. You have blue lips or fingernails. You have thoughts  of harming yourself or harming others. These symptoms may represent a serious problem that is an emergency. Do not wait to see if the symptoms will go away. Get medical help right away. Call your local emergency services (911 in the [place] Do not drive yourself to the hospital.  If you ever feel like you may hurt yourself or others, or have thoughts about taking your own life, get help right away. Go to your nearest emergency department or: Call your local emergency services (911 in the [place] Call the [facility] 218-587-0480 in the [place] Call a suicide crisis helpline, such as the [place] at 828-116-7274 or 988 in the [place]. This is open 24 hours a day in the [place] Text the Crisis Text Line at (906)372-6387 (in the [place] Summary Opioid medicines can help you manage moderate to severe pain for a short period of time. A pain treatment plan is an agreement between you and your health care provider. Discuss the goals of your treatment, including how much pain you might expect to have and how you will manage the pain. If you think that you or someone else may have taken too much of an opioid, get medical help right away. This information is not intended to replace advice given to you by your health care provider. Make sure you discuss any questions you have with your health care provider. Document Revised: 04/28/2021 Document Reviewed: 01/13/2021 Elsevier Patient Education  [place] directives: No  Conditions/risks identified: None  Next appointment: Follow up in one year for your annual wellness visit.    Preventive Care 82 Years and Older, Male Preventive care refers to lifestyle choices and visits with your health care provider that can promote health and wellness. What does preventive care include? A yearly physical exam. This is also called an annual well check. Dental exams once or twice a year. Routine eye exams. Ask  your health care provider how often you should have your eyes checked. Personal lifestyle choices, including: Daily care of your teeth and gums. Regular physical activity. Eating a healthy diet. Avoiding tobacco and drug use. Limiting alcohol use. Practicing safe sex. Taking low doses of aspirin every day. Taking vitamin and mineral supplements as recommended by your health care provider. What happens during an annual well check? The services and screenings done by your health care provider during your annual well check will depend on your age, overall health, lifestyle risk factors, and family history of disease. Counseling  Your health care provider may ask you questions about your: Alcohol  use. Tobacco use. Drug use. Emotional well-being. Home and relationship well-being. Sexual activity. Eating habits. History of falls. Memory and ability to understand (cognition). Work and work Statistician. Screening  You may have the following tests or measurements: Height, weight, and BMI. Blood pressure. Lipid and cholesterol levels. These may be checked every 5 years, or more frequently if you are over 89 years old. Skin check. Lung cancer screening. You may have this screening every year starting at age 15 if you have a 30-pack-year history of smoking and currently smoke or have quit within the past 15 years. Fecal occult blood test (FOBT) of the stool. You may have this test every year starting at age 7. Flexible sigmoidoscopy or colonoscopy. You may have a sigmoidoscopy every 5 years or a colonoscopy every 10 years starting at age 12. Prostate cancer screening. Recommendations will vary depending on your family history and other risks. Hepatitis C blood test. Hepatitis B blood test. Sexually transmitted disease (STD) testing. Diabetes screening. This is done by checking your blood sugar (glucose) after you have not eaten for a while (fasting). You may have this done every 1-3  years. Abdominal aortic aneurysm (AAA) screening. You may need this if you are a current or former smoker. Osteoporosis. You may be screened starting at age 52 if you are at high risk. Talk with your health care provider about your test results, treatment options, and if necessary, the need for more tests. Vaccines  Your health care provider may recommend certain vaccines, such as: Influenza vaccine. This is recommended every year. Tetanus, diphtheria, and acellular pertussis (Tdap, Td) vaccine. You may need a Td booster every 10 years. Zoster vaccine. You may need this after age 50. Pneumococcal 13-valent conjugate (PCV13) vaccine. One dose is recommended after age 68. Pneumococcal polysaccharide (PPSV23) vaccine. One dose is recommended after age 43. Talk to your health care provider about which screenings and vaccines you need and how often you need them. This information is not intended to replace advice given to you by your health care provider. Make sure you discuss any questions you have with your health care provider. Document Released: 10/30/2015 Document Revised: 06/22/2016 Document Reviewed: 08/04/2015 Elsevier Interactive Patient Education  2017 [place] Prevention in the Home Falls can cause injuries. They can happen to people of all ages. There are many things you can do to make your home safe and to help prevent falls. What can I do on the outside of my home? Regularly fix the edges of walkways and driveways and fix any cracks. Remove anything that might make you trip as you walk through a door, such as a raised step or threshold. Trim any bushes or trees on the path to your home. Use bright outdoor lighting. Clear any walking paths of anything that might make someone trip, such as rocks or tools. Regularly check to see if handrails are loose or broken. Make sure that both sides of any steps have handrails. Any raised decks and porches should have guardrails on the  edges. Have any leaves, snow, or ice cleared regularly. Use sand or salt on walking paths during winter. Clean up any spills in your garage right away. This includes oil or grease spills. What can I do in the bathroom? Use night lights. Install grab bars by the toilet and in the tub and shower. Do not use towel bars as grab bars. Use non-skid mats or decals in the tub or shower. If you need to sit down  in the shower, use a plastic, non-slip stool. Keep the floor dry. Clean up any water that spills on the floor as soon as it happens. Remove soap buildup in the tub or shower regularly. Attach bath mats securely with double-sided non-slip rug tape. Do not have throw rugs and other things on the floor that can make you trip. What can I do in the bedroom? Use night lights. Make sure that you have a light by your bed that is easy to reach. Do not use any sheets or blankets that are too big for your bed. They should not hang down onto the floor. Have a firm chair that has side arms. You can use this for support while you get dressed. Do not have throw rugs and other things on the floor that can make you trip. What can I do in the kitchen? Clean up any spills right away. Avoid walking on wet floors. Keep items that you use a lot in easy-to-reach places. If you need to reach something above you, use a strong step stool that has a grab bar. Keep electrical cords out of the way. Do not use floor polish or wax that makes floors slippery. If you must use wax, use non-skid floor wax. Do not have throw rugs and other things on the floor that can make you trip. What can I do with my stairs? Do not leave any items on the stairs. Make sure that there are handrails on both sides of the stairs and use them. Fix handrails that are broken or loose. Make sure that handrails are as long as the stairways. Check any carpeting to make sure that it is firmly attached to the stairs. Fix any carpet that is loose or  worn. Avoid having throw rugs at the top or bottom of the stairs. If you do have throw rugs, attach them to the floor with carpet tape. Make sure that you have a light switch at the top of the stairs and the bottom of the stairs. If you do not have them, ask someone to add them for you. What else can I do to help prevent falls? Wear shoes that: Do not have high heels. Have rubber bottoms. Are comfortable and fit you well. Are closed at the toe. Do not wear sandals. If you use a stepladder: Make sure that it is fully opened. Do not climb a closed stepladder. Make sure that both sides of the stepladder are locked into place. Ask someone to hold it for you, if possible. Clearly mark and make sure that you can see: Any grab bars or handrails. First and last steps. Where the edge of each step is. Use tools that help you move around (mobility aids) if they are needed. These include: Canes. Walkers. Scooters. Crutches. Turn on the lights when you go into a dark area. Replace any light bulbs as soon as they burn out. Set up your furniture so you have a clear path. Avoid moving your furniture around. If any of your floors are uneven, fix them. If there are any pets around you, be aware of where they are. Review your medicines with your doctor. Some medicines can make you feel dizzy. This can increase your chance of falling. Ask your doctor what other things that you can do to help prevent falls. This information is not intended to replace advice given to you by your health care provider. Make sure you discuss any questions you have with your health care provider. Document Released:  07/30/2009 Document Revised: 03/10/2016 Document Reviewed: 11/07/2014 Elsevier Interactive Patient Education  2017 [employer].

## 2022-07-15 DIAGNOSIS — E162 Hypoglycemia, unspecified: Secondary | ICD-10-CM | POA: Diagnosis not present

## 2022-07-15 DIAGNOSIS — E7800 Pure hypercholesterolemia, unspecified: Secondary | ICD-10-CM | POA: Diagnosis not present

## 2022-07-15 DIAGNOSIS — I10 Essential (primary) hypertension: Secondary | ICD-10-CM | POA: Diagnosis not present

## 2022-07-15 DIAGNOSIS — Z23 Encounter for immunization: Secondary | ICD-10-CM | POA: Diagnosis not present

## 2022-07-15 DIAGNOSIS — E1165 Type 2 diabetes mellitus with hyperglycemia: Secondary | ICD-10-CM | POA: Diagnosis not present

## 2022-07-18 ENCOUNTER — Other Ambulatory Visit: Payer: Self-pay | Admitting: Family Medicine

## 2022-07-18 DIAGNOSIS — E876 Hypokalemia: Secondary | ICD-10-CM

## 2022-07-22 ENCOUNTER — Other Ambulatory Visit: Payer: Self-pay | Admitting: Family Medicine

## 2022-08-10 ENCOUNTER — Telehealth: Payer: Self-pay | Admitting: Family Medicine

## 2022-08-10 NOTE — Telephone Encounter (Addendum)
Pt's spouse stated that although Triage Nurse advised them to go to Urgent Care, Pt is no longer having chest pain.    Spouse was requesting an OV for Pt.  Spouse was informed that once Triage Nurse says go to UC, an OV cannot be made.  Spouse stated she understood.

## 2022-08-10 NOTE — Telephone Encounter (Signed)
Error. Please disregard

## 2022-08-11 NOTE — Telephone Encounter (Signed)
Caller states he has chest pain and had an incident last week and last night and pain lasted an hour, he took Tylenol, no chest pain now, has pain in both legs that has been ongoing, rates pain 4/10, no swelling, denies other symptoms  08/10/2022 11:05:20 AM Go to ED Now (or PCP triage) Nicki Reaper, RN, Malachy Mood  Comments User: Burna Sis, RN Date/Time Eilene Ghazi Time): 08/10/2022 11:06:56 AM Instructed to call office for further instructions and let them know he has spoken with tirage nurse and refused ER  Referrals [facility] REFUSED  08/11/22 1503 - Pt decided to "wait it out". Wife states she thinks pt just ate something that caused the pain b/c he appear fine at this time. Wife does request appt with PCP b/c she believes "it will put his mind at ease". Appt scheduled with PCP on 08/12/22 at 2p; will combine with 3 month f/u.

## 2022-08-12 ENCOUNTER — Encounter: Payer: Self-pay | Admitting: Family Medicine

## 2022-08-12 ENCOUNTER — Ambulatory Visit (INDEPENDENT_AMBULATORY_CARE_PROVIDER_SITE_OTHER): Payer: HMO | Admitting: Family Medicine

## 2022-08-12 VITALS — BP 112/76 | HR 70 | Temp 98.4°F | Wt 221.6 lb

## 2022-08-12 DIAGNOSIS — I440 Atrioventricular block, first degree: Secondary | ICD-10-CM

## 2022-08-12 DIAGNOSIS — I10 Essential (primary) hypertension: Secondary | ICD-10-CM

## 2022-08-12 DIAGNOSIS — I4510 Unspecified right bundle-branch block: Secondary | ICD-10-CM

## 2022-08-12 DIAGNOSIS — R079 Chest pain, unspecified: Secondary | ICD-10-CM

## 2022-08-12 DIAGNOSIS — E1140 Type 2 diabetes mellitus with diabetic neuropathy, unspecified: Secondary | ICD-10-CM

## 2022-08-12 DIAGNOSIS — R9431 Abnormal electrocardiogram [ECG] [EKG]: Secondary | ICD-10-CM

## 2022-08-12 DIAGNOSIS — E782 Mixed hyperlipidemia: Secondary | ICD-10-CM

## 2022-08-12 NOTE — Patient Instructions (Signed)
Your EKG from today shows signs of a likely old heart attack.  This was previously seen on an EKG from 2020.  There is also signs of a possible right bundle branch block which has to do with the electrical activity in your heart.  It is basically like a type of conduction delay.  I have included some information about this in your handouts.  There is also signs of what is called first-degree AV block.  This is basically all his life prolongation and part of the EKG.  I have also included information regarding this.  Given your intermittent chest pain I placed a referral  to see the cardiologist.  You should expect a phone call about scheduling this appointment.  In the meantime if you develop continued or worsening chest pain, shortness of breath, numbness in left upper arm, dizziness, or other symptoms proceed to nearest emergency department.
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
## 2022-08-15 ENCOUNTER — Ambulatory Visit: Payer: HMO | Attending: Cardiovascular Disease | Admitting: Cardiovascular Disease

## 2022-08-15 ENCOUNTER — Encounter: Payer: Self-pay | Admitting: Cardiovascular Disease

## 2022-08-15 VITALS — BP 138/84 | HR 89 | Ht 70.0 in | Wt 219.0 lb

## 2022-08-15 DIAGNOSIS — R072 Precordial pain: Secondary | ICD-10-CM

## 2022-08-15 DIAGNOSIS — R079 Chest pain, unspecified: Secondary | ICD-10-CM | POA: Diagnosis not present

## 2022-08-15 MED ORDER — METOPROLOL TARTRATE 100 MG PO TABS: 100.0000 mg | ORAL_TABLET | Freq: Once | ORAL | 0 refills | Status: DC

## 2022-08-15 NOTE — Patient Instructions (Addendum)
Medication Instructions:  No changes today *If you need a refill on your cardiac medications before your next appointment, please call your pharmacy*   Lab Work: Today: BMET   Testing/Procedures: Your physician has requested that you have an echocardiogram. Echocardiography is a painless test that uses sound waves to create images of your heart. It provides your doctor with information about the size and shape of your heart and how well your heart's chambers and valves are working. This procedure takes approximately one hour. There are no restrictions for this procedure. Please do NOT wear cologne, perfume, aftershave, or lotions (deodorant is allowed). Please arrive 15 minutes prior to your appointment time.  Cardiac CTA - see instructions below   Follow-Up: At [facility], you and your health needs are our priority.  As part of our continuing mission to provide you with exceptional heart care, we have created designated Provider Care Teams.  These Care Teams include your primary Cardiologist (physician) and Advanced Practice Providers (APPs -  Physician Assistants and Nurse Practitioners) who all work together to provide you with the care you need, when you need it.  We recommend signing up for the patient portal called "MyChart".  Sign up information is provided on this After Visit Summary.  MyChart is used to connect with patients for Virtual Visits (Telemedicine).  Patients are able to view lab/test results, encounter notes, upcoming appointments, etc.  Non-urgent messages can be sent to your provider as well.   To learn more about what you can do with MyChart, go to [email or web address].    Your next appointment:   12 month(s)  The format for your next appointment:   In Person  Provider:   [name], MD    Your cardiac CT will be scheduled at one  [facility] [place], [place] [address] [phone]  Please arrive at  the [facility] (Entrance C2) of [facility] 30 minutes prior to test start time. You can use the FREE valet parking offered at entrance C (encouraged to control the heart rate for the test)  Proceed to the [facility] Radiology Department (first floor) to check-in and test prep.  All radiology patients and guests should use entrance C2 at [facility] accessed from [facility], even though the hospital's physical address listed is [address]     Please follow these instructions carefully (unless otherwise directed):  Hold all erectile dysfunction medications at least 3 days (72 hrs) prior to test. (Ie viagra, cialis, sildenafil, tadalafil, etc) We will administer nitroglycerin during this exam.   On the Night Before the Test: Be sure to Drink plenty of water. Do not consume any caffeinated/decaffeinated beverages or chocolate 12 hours prior to your test. Do not take any antihistamines 12 hours prior to your test.  On the Day of the Test: Drink plenty of water until 1 hour prior to the test. Do not eat any food 1 hour prior to test. You may take your regular medications prior to the test.  Take metoprolol (Lopressor) two hours prior to test. HOLD Atenolol/Chlorthalidone and Metformin morning of the test. CONTINUE TO HOLD METFORMIN 48 HOURS AFTER TEST   After the Test: Drink plenty of water. After receiving IV contrast, you may experience a mild flushed feeling. This is normal. On occasion, you may experience a mild rash up to 24 hours after the test. This is not dangerous. If this occurs, you can take Benadryl  25 mg and increase your fluid intake. If you experience trouble breathing, this can be serious. If it is severe call 911 IMMEDIATELY. If it is mild, please call our office. If you take any of these medications: Glipizide/Metformin, Avandament, Glucavance, please do not take 48 hours after completing test unless otherwise  instructed.  We will call to schedule your test 2-4 weeks out understanding that some insurance companies will need an authorization prior to the service being performed.   For non-scheduling related questions, please contact the cardiac imaging nurse navigator should you have any questions/concerns: Marchia Bond, Cardiac Imaging Nurse Navigator Gordy Clement, Cardiac Imaging Nurse Navigator [facility] Direct Office Dial: [phone]   For scheduling needs, including cancellations and rescheduling, please call [place], [phone].
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
## 2022-08-16 LAB — BASIC METABOLIC PANEL
BUN/Creatinine Ratio: 13 (ref 10–24)
BUN: 14 mg/dL (ref 8–27)
CO2: 22 mmol/L (ref 20–29)
Calcium: 10 mg/dL (ref 8.6–10.2)
Chloride: 99 mmol/L (ref 96–106)
Creatinine, Ser: 1.05 mg/dL (ref 0.76–1.27)
Glucose: 246 mg/dL — ABNORMAL HIGH (ref 70–99)
Potassium: 3.6 mmol/L (ref 3.5–5.2)
Sodium: 138 mmol/L (ref 134–144)
eGFR: 75 mL/min/{1.73_m2} (ref >59)

## 2022-08-21 ENCOUNTER — Other Ambulatory Visit: Payer: Self-pay | Admitting: Family Medicine

## 2022-08-22 ENCOUNTER — Ambulatory Visit: Payer: HMO | Admitting: Family Medicine

## 2022-08-29 ENCOUNTER — Other Ambulatory Visit: Payer: Self-pay | Admitting: Family Medicine

## 2022-08-29 DIAGNOSIS — E1165 Type 2 diabetes mellitus with hyperglycemia: Secondary | ICD-10-CM

## 2022-09-01 ENCOUNTER — Ambulatory Visit (HOSPITAL_COMMUNITY): Payer: HMO | Attending: Internal Medicine

## 2022-09-01 DIAGNOSIS — R079 Chest pain, unspecified: Secondary | ICD-10-CM | POA: Insufficient documentation

## 2022-09-01 DIAGNOSIS — R072 Precordial pain: Secondary | ICD-10-CM | POA: Insufficient documentation

## 2022-09-01 LAB — ECHOCARDIOGRAM COMPLETE
Area-P 1/2: 3.12 cm2
S' Lateral: 2.6 cm

## 2022-09-01 MED ORDER — PERFLUTREN LIPID MICROSPHERE
1.0000 mL | INTRAVENOUS | Status: AC | PRN
  Administered 2022-09-01: 2 mL via INTRAVENOUS

## 2022-09-12 ENCOUNTER — Telehealth (HOSPITAL_COMMUNITY): Payer: Self-pay | Admitting: Emergency Medicine

## 2022-09-12 NOTE — Telephone Encounter (Signed)
Unable to leave vm  [facility] [phone] Office  [phone] Cell

## 2022-09-14 ENCOUNTER — Ambulatory Visit (HOSPITAL_COMMUNITY)
Admission: RE | Admit: 2022-09-14 | Discharge: 2022-09-14 | Disposition: A | Discharge status: Home / Self Care | Payer: PPO | Source: Ambulatory Visit | Attending: Cardiovascular Disease | Admitting: Cardiovascular Disease

## 2022-09-14 DIAGNOSIS — R079 Chest pain, unspecified: Secondary | ICD-10-CM | POA: Insufficient documentation

## 2022-09-14 DIAGNOSIS — R072 Precordial pain: Secondary | ICD-10-CM | POA: Diagnosis present

## 2022-09-14 DIAGNOSIS — I2510 Atherosclerotic heart disease of native coronary artery without angina pectoris: Secondary | ICD-10-CM

## 2022-09-14 MED ORDER — NITROGLYCERIN 0.4 MG SL SUBL
0.8000 mg | SUBLINGUAL_TABLET | Freq: Once | SUBLINGUAL | Status: AC
  Administered 2022-09-14: 0.8 mg via SUBLINGUAL

## 2022-09-14 MED ORDER — METOPROLOL TARTRATE 5 MG/5ML IV SOLN
INTRAVENOUS | Status: AC
  Administered 2022-09-14: 5 mg via INTRAVENOUS
  Filled 2022-09-14: qty 10

## 2022-09-14 MED ORDER — METOPROLOL TARTRATE 5 MG/5ML IV SOLN
10.0000 mg | INTRAVENOUS | Status: DC | PRN
  Administered 2022-09-14: 5 mg via INTRAVENOUS

## 2022-09-14 MED ORDER — IOHEXOL 350 MG/ML SOLN
100.0000 mL | Freq: Once | INTRAVENOUS | Status: AC | PRN
  Administered 2022-09-14: 100 mL via INTRAVENOUS

## 2022-09-14 MED ORDER — NITROGLYCERIN 0.4 MG SL SUBL
SUBLINGUAL_TABLET | SUBLINGUAL | Status: AC
  Filled 2022-09-14: qty 2

## 2022-09-16 ENCOUNTER — Telehealth: Payer: Self-pay | Admitting: Cardiovascular Disease

## 2022-09-16 NOTE — Telephone Encounter (Signed)
Pt spouse calling for echo and ct results

## 2022-09-16 NOTE — Telephone Encounter (Signed)
-----   Message from Burnell Blanks, MD sent at 09/14/2022  2:32 PM EST ----- I reviewed his scan with Dr. Marlou Porch. He has hardening of the arteries and possible significant stenosis in the mid to distal LAD. I would like to see him back in the office to discuss over the next few weeks. Thanks, chris

## 2022-09-16 NOTE — Telephone Encounter (Signed)
I called and spoke with the patient and his wife.  Informed of results and recommendation to come in to discuss further.  Scheduled this Monday with Dr. Angelena Form.

## 2022-09-19 ENCOUNTER — Ambulatory Visit: Payer: PPO | Attending: Cardiovascular Disease | Admitting: Cardiovascular Disease

## 2022-09-19 ENCOUNTER — Encounter: Payer: Self-pay | Admitting: Cardiovascular Disease

## 2022-09-19 VITALS — BP 130/88 | HR 79 | Ht 70.0 in | Wt 220.6 lb

## 2022-09-19 DIAGNOSIS — I25110 Atherosclerotic heart disease of native coronary artery with unstable angina pectoris: Secondary | ICD-10-CM

## 2022-09-19 DIAGNOSIS — Z01812 Encounter for preprocedural laboratory examination: Secondary | ICD-10-CM

## 2022-09-19 MED ORDER — ISOSORBIDE MONONITRATE ER 30 MG PO TB24: 15.0000 mg | ORAL_TABLET | Freq: Every day | ORAL | 3 refills | Status: DC
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
## 2022-09-19 NOTE — Patient Instructions (Addendum)
Medication Instructions:  Your physician has recommended you make the following change in your medication:  1.) start isosorbide 15 mg - Half tablet daily  *If you need a refill on your cardiac medications before your next appointment, please call your pharmacy*   Lab Work: Today: bmet, cbc   Testing/Procedures: Your physician has requested that you have a cardiac catheterization. Cardiac catheterization is used to diagnose and/or treat various heart conditions. Doctors may recommend this procedure for a number of different reasons. The most common reason is to evaluate chest pain. Chest pain can be a symptom of coronary artery disease (CAD), and cardiac catheterization can show whether plaque is narrowing or blocking your heart's arteries. This procedure is also used to evaluate the valves, as well as measure the blood flow and oxygen levels in different parts of your heart. For further information please visit [email or web address]. Please follow instruction sheet, as given.   Follow-Up: At [facility], you and your health needs are our priority.  As part of our continuing mission to provide you with exceptional heart care, we have created designated Provider Care Teams.  These Care Teams include your primary Cardiologist (physician) and Advanced Practice Providers (APPs -  Physician Assistants and Nurse Practitioners) who all work together to provide you with the care you need, when you need it.   Your next appointment:   6 week(s) --see below  The format for your next appointment:   In Person  Provider:   [name], MD           Cardiac/Peripheral Catheterization   You are scheduled for a Cardiac Catheterization on Thursday, December 21 with Dr. [name].  1. Please arrive at the Main Entrance A at [facility]: [place], [place] [address] on December 21 at 7:00 AM (This time is two hours before your procedure to ensure your  preparation). Free valet parking service is available. You will check in at ADMITTING. The support person will be asked to wait in the waiting room.  It is OK to have someone drop you off and come back when you are ready to be discharged.        Special note: Every effort is made to have your procedure done on time. Please understand that emergencies sometimes delay scheduled procedures.   . 2. Diet: Do not eat solid foods after midnight.  You may have clear liquids until 5 AM the day of the procedure.  3. Labs: You will need to have blood drawn today.  You do not need to be fasting.  4. Medication instructions in preparation for your procedure:   Contrast Allergy: No   Do not take Diabetes Med Glucophage (Metformin) on the day of the procedure and HOLD 48 HOURS AFTER THE PROCEDURE. - restart Dec 24  On the morning of your procedure, take Aspirin 81 mg and any morning medicines NOT listed above.  You may use sips of water.  5. Plan to go home the same day, you will only stay overnight if medically necessary. 6. You MUST have a responsible adult to drive you home. 7. An adult MUST be with you the first 24 hours after you arrive home. 8. Bring a current list of your medications, and the last time and date medication taken. 9. Bring ID and current insurance cards. 10.Please wear clothes that are easy to get on and off and wear slip-on shoes.  Thank you for allowing [place] to care for you!   --  [facility]
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
## 2022-09-20 LAB — CBC
Hematocrit: 42.8 % (ref 37.5–51.0)
Hemoglobin: 14.1 g/dL (ref 13.0–17.7)
MCH: 28 pg (ref 26.6–33.0)
MCHC: 32.9 g/dL (ref 31.5–35.7)
MCV: 85 fL (ref 79–97)
Platelets: 292 10*3/uL (ref 150–450)
RBC: 5.04 x10E6/uL (ref 4.14–5.80)
RDW: 13.7 % (ref 11.6–15.4)
WBC: 7.4 10*3/uL (ref 3.4–10.8)

## 2022-09-20 LAB — BASIC METABOLIC PANEL
BUN/Creatinine Ratio: 19 (ref 10–24)
BUN: 20 mg/dL (ref 8–27)
CO2: 21 mmol/L (ref 20–29)
Calcium: 9.5 mg/dL (ref 8.6–10.2)
Chloride: 102 mmol/L (ref 96–106)
Creatinine, Ser: 1.06 mg/dL (ref 0.76–1.27)
Glucose: 153 mg/dL — ABNORMAL HIGH (ref 70–99)
Potassium: 3.8 mmol/L (ref 3.5–5.2)
Sodium: 141 mmol/L (ref 134–144)
eGFR: 74 mL/min/{1.73_m2} (ref >59)

## 2022-10-05 ENCOUNTER — Telehealth: Payer: Self-pay | Admitting: *Deleted

## 2022-10-05 NOTE — Telephone Encounter (Signed)
Cardiac Catheterization scheduled at [facility] for: Thursday October 06, 2022 9 AM Arrival time and place: [facility] Main Entrance A at: 7 AM  Nothing to eat after midnight prior to procedure, clear liquids until 5 AM day of procedure.  Medication instructions: -Hold:  Insulin-AM of procedure  Metformin-day of procedure and 48 hours post procedure  Glimepiride-AM of procedure   Atenolol-HCTZ/KCl-AM of procedure  Patient reports he does not take Prandin.  -Except hold medications usual morning medications can be taken with sips of water including aspirin 81 mg.  Confirmed patient has responsible adult to drive home post procedure and be with patient first 24 hours after arriving home.  Patient reports no new symptoms concerning for COVID-19 in the past 10 days.  Reviewed procedure instructions with patient.

## 2022-10-06 ENCOUNTER — Other Ambulatory Visit: Payer: Self-pay

## 2022-10-06 ENCOUNTER — Ambulatory Visit (HOSPITAL_COMMUNITY)
Admission: RE | Disposition: A | Discharge status: Home / Self Care | Payer: Self-pay | Source: Home / Self Care | Attending: Cardiovascular Disease

## 2022-10-06 ENCOUNTER — Ambulatory Visit (HOSPITAL_COMMUNITY)
Admission: RE | Admit: 2022-10-06 | Discharge: 2022-10-06 | Disposition: A | Discharge status: Home / Self Care | Payer: PPO | Attending: Cardiovascular Disease | Admitting: Cardiovascular Disease

## 2022-10-06 DIAGNOSIS — Z79899 Other long term (current) drug therapy: Secondary | ICD-10-CM | POA: Diagnosis not present

## 2022-10-06 DIAGNOSIS — I25110 Atherosclerotic heart disease of native coronary artery with unstable angina pectoris: Secondary | ICD-10-CM

## 2022-10-06 DIAGNOSIS — E785 Hyperlipidemia, unspecified: Secondary | ICD-10-CM | POA: Diagnosis not present

## 2022-10-06 DIAGNOSIS — I10 Essential (primary) hypertension: Secondary | ICD-10-CM | POA: Insufficient documentation

## 2022-10-06 DIAGNOSIS — I25119 Atherosclerotic heart disease of native coronary artery with unspecified angina pectoris: Secondary | ICD-10-CM | POA: Insufficient documentation

## 2022-10-06 DIAGNOSIS — I2584 Coronary atherosclerosis due to calcified coronary lesion: Secondary | ICD-10-CM | POA: Insufficient documentation

## 2022-10-06 DIAGNOSIS — E119 Type 2 diabetes mellitus without complications: Secondary | ICD-10-CM | POA: Insufficient documentation

## 2022-10-06 DIAGNOSIS — Z7982 Long term (current) use of aspirin: Secondary | ICD-10-CM | POA: Insufficient documentation

## 2022-10-06 DIAGNOSIS — I2510 Atherosclerotic heart disease of native coronary artery without angina pectoris: Secondary | ICD-10-CM | POA: Diagnosis not present

## 2022-10-06 DIAGNOSIS — Z7984 Long term (current) use of oral hypoglycemic drugs: Secondary | ICD-10-CM | POA: Insufficient documentation

## 2022-10-06 HISTORY — PX: LEFT HEART CATH AND CORONARY ANGIOGRAPHY: CATH118249

## 2022-10-06 LAB — GLUCOSE, CAPILLARY: Glucose-Capillary: 170 mg/dL — ABNORMAL HIGH (ref 70–99)

## 2022-10-06 SURGERY — LEFT HEART CATH AND CORONARY ANGIOGRAPHY
Anesthesia: LOCAL

## 2022-10-06 MED ORDER — SODIUM CHLORIDE 0.9% FLUSH: 3.0000 mL | INTRAVENOUS | Status: DC | PRN

## 2022-10-06 MED ORDER — IOHEXOL 350 MG/ML SOLN
INTRAVENOUS | Status: DC | PRN
  Administered 2022-10-06: 45 mL

## 2022-10-06 MED ORDER — SODIUM CHLORIDE 0.9 % WEIGHT BASED INFUSION: 1.0000 mL/kg/h | INTRAVENOUS | Status: DC

## 2022-10-06 MED ORDER — SODIUM CHLORIDE 0.9% FLUSH: 3.0000 mL | Freq: Two times a day (BID) | INTRAVENOUS | Status: DC

## 2022-10-06 MED ORDER — ASPIRIN 81 MG PO CHEW
81.0000 mg | CHEWABLE_TABLET | ORAL | Status: AC
  Administered 2022-10-06: 81 mg via ORAL
  Filled 2022-10-06: qty 1

## 2022-10-06 MED ORDER — HEPARIN SODIUM (PORCINE) 1000 UNIT/ML IJ SOLN
INTRAMUSCULAR | Status: AC
  Filled 2022-10-06: qty 10

## 2022-10-06 MED ORDER — VERAPAMIL HCL 2.5 MG/ML IV SOLN
INTRAVENOUS | Status: DC | PRN
  Administered 2022-10-06: 10 mL via INTRA_ARTERIAL

## 2022-10-06 MED ORDER — LIDOCAINE HCL (PF) 1 % IJ SOLN
INTRAMUSCULAR | Status: AC
  Filled 2022-10-06: qty 30

## 2022-10-06 MED ORDER — VERAPAMIL HCL 2.5 MG/ML IV SOLN
INTRAVENOUS | Status: AC
  Filled 2022-10-06: qty 2

## 2022-10-06 MED ORDER — SODIUM CHLORIDE 0.9 % WEIGHT BASED INFUSION
3.0000 mL/kg/h | INTRAVENOUS | Status: AC
  Administered 2022-10-06: 3 mL/kg/h via INTRAVENOUS

## 2022-10-06 MED ORDER — SODIUM CHLORIDE 0.9 % IV SOLN: 250.0000 mL | INTRAVENOUS | Status: DC | PRN

## 2022-10-06 MED ORDER — HYDRALAZINE HCL 20 MG/ML IJ SOLN: 10.0000 mg | INTRAMUSCULAR | Status: DC | PRN

## 2022-10-06 MED ORDER — ONDANSETRON HCL 4 MG/2ML IJ SOLN: 4.0000 mg | Freq: Four times a day (QID) | INTRAMUSCULAR | Status: DC | PRN

## 2022-10-06 MED ORDER — MIDAZOLAM HCL 2 MG/2ML IJ SOLN
INTRAMUSCULAR | Status: AC
  Filled 2022-10-06: qty 2

## 2022-10-06 MED ORDER — ACETAMINOPHEN 325 MG PO TABS: 650.0000 mg | ORAL_TABLET | ORAL | Status: DC | PRN

## 2022-10-06 MED ORDER — FENTANYL CITRATE (PF) 100 MCG/2ML IJ SOLN
INTRAMUSCULAR | Status: DC | PRN
  Administered 2022-10-06: 50 ug via INTRAVENOUS

## 2022-10-06 MED ORDER — LIDOCAINE HCL (PF) 1 % IJ SOLN
INTRAMUSCULAR | Status: DC | PRN
  Administered 2022-10-06: 2 mL

## 2022-10-06 MED ORDER — HEPARIN SODIUM (PORCINE) 1000 UNIT/ML IJ SOLN
INTRAMUSCULAR | Status: DC | PRN
  Administered 2022-10-06: 5000 [IU] via INTRAVENOUS

## 2022-10-06 MED ORDER — SODIUM CHLORIDE 0.9 % IV SOLN: INTRAVENOUS | Status: AC

## 2022-10-06 MED ORDER — HEPARIN (PORCINE) IN NACL 1000-0.9 UT/500ML-% IV SOLN
INTRAVENOUS | Status: AC
  Filled 2022-10-06: qty 1000

## 2022-10-06 MED ORDER — MIDAZOLAM HCL 2 MG/2ML IJ SOLN
INTRAMUSCULAR | Status: DC | PRN
  Administered 2022-10-06: 1 mg via INTRAVENOUS

## 2022-10-06 MED ORDER — LABETALOL HCL 5 MG/ML IV SOLN: 10.0000 mg | INTRAVENOUS | Status: DC | PRN

## 2022-10-06 MED ORDER — HEPARIN (PORCINE) IN NACL 1000-0.9 UT/500ML-% IV SOLN
INTRAVENOUS | Status: DC | PRN
  Administered 2022-10-06 (×2): 500 mL

## 2022-10-06 MED ORDER — FENTANYL CITRATE (PF) 100 MCG/2ML IJ SOLN
INTRAMUSCULAR | Status: AC
  Filled 2022-10-06: qty 2

## 2022-10-06 SURGICAL SUPPLY — 10 items
CATH 5FR JL3.5 JR4 ANG PIG MP (CATHETERS) IMPLANT
DEVICE RAD COMP TR BAND LRG (VASCULAR PRODUCTS) IMPLANT
ELECT DEFIB PAD ADLT CADENCE (PAD) IMPLANT
GLIDESHEATH SLEND SS 6F .021 (SHEATH) IMPLANT
GUIDEWIRE INQWIRE 1.5J.035X260 (WIRE) IMPLANT
INQWIRE 1.5J .035X260CM (WIRE) ×1
KIT HEART LEFT (KITS) ×2 IMPLANT
PACK CARDIAC CATHETERIZATION (CUSTOM PROCEDURE TRAY) ×2 IMPLANT
TRANSDUCER W/STOPCOCK (MISCELLANEOUS) ×2 IMPLANT
TUBING CIL FLEX 10 FLL-RA (TUBING) ×2 IMPLANT

## 2022-10-06 NOTE — Discharge Instructions (Signed)

## 2022-10-06 NOTE — Interval H&P Note (Signed)
History and Physical Interval Note:  10/06/2022 7:30 AM  John Hobbs  has presented today for surgery, with the diagnosis of cad - abnormal cta.  The various methods of treatment have been discussed with the patient and family. After consideration of risks, benefits and other options for treatment, the patient has consented to  Procedure(s): LEFT HEART CATH AND CORONARY ANGIOGRAPHY (N/A) as a surgical intervention.  The patient's history has been reviewed, patient examined, no change in status, stable for surgery.  I have reviewed the patient's chart and labs.  Questions were answered to the patient's satisfaction.    Cath Lab Visit (complete for each Cath Lab visit)  Clinical Evaluation Leading to the Procedure:   ACS: No.  Non-ACS:    Anginal Classification: CCS III  Anti-ischemic medical therapy: Maximal Therapy (2 or more classes of medications)  Non-Invasive Test Results: High-risk stress test findings: cardiac mortality >3%/year (Coronary CTA with possible severe LAD stenosis)  Prior CABG: No previous CABG        John Hobbs

## 2022-10-07 ENCOUNTER — Encounter (HOSPITAL_COMMUNITY): Payer: Self-pay | Admitting: Cardiovascular Disease

## 2022-10-07 MED FILL — Heparin Sod (Porcine)-NaCl IV Soln 1000 Unit/500ML-0.9%: INTRAVENOUS | Qty: 500 | Status: AC
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
## 2022-10-21 ENCOUNTER — Encounter: Payer: Self-pay | Admitting: Nurse Practitioner

## 2022-10-21 ENCOUNTER — Ambulatory Visit: Payer: PPO | Attending: Nurse Practitioner | Admitting: Nurse Practitioner

## 2022-10-21 VITALS — BP 120/78 | HR 83 | Ht 70.0 in | Wt 227.0 lb

## 2022-10-21 DIAGNOSIS — I10 Essential (primary) hypertension: Secondary | ICD-10-CM

## 2022-10-21 DIAGNOSIS — E785 Hyperlipidemia, unspecified: Secondary | ICD-10-CM | POA: Diagnosis not present

## 2022-10-21 DIAGNOSIS — I2510 Atherosclerotic heart disease of native coronary artery without angina pectoris: Secondary | ICD-10-CM | POA: Diagnosis not present

## 2022-10-21 NOTE — Patient Instructions (Addendum)
Medication Instructions:  Your physician recommends that you continue on your current medications as directed. Please refer to the Current Medication list given to you today.  *If you need a refill on your cardiac medications before your next appointment, please call your pharmacy*   Lab Work: Your physician recommends that you return for lab work ON Tuesday Jan. 9 - you may come in anytime between 7:30 am and 4:30 pm. You will need to have nothing to eat or drink after midnight the night before except water or black coffee.   If you have labs (blood work) drawn today and your tests are completely normal, you will receive your results only by: [address] (if you have MyChart) OR A paper copy in the mail If you have any lab test that is abnormal or we need to change your treatment, we will call you to review the results.   Testing/Procedures: None Ordered   Follow-Up: At [facility], you and your health needs are our priority.  As part of our continuing mission to provide you with exceptional heart care, we have created designated Provider Care Teams.  These Care Teams include your primary Cardiologist (physician) and Advanced Practice Providers (APPs -  Physician Assistants and Nurse Practitioners) who all work together to provide you with the care you need, when you need it.  We recommend signing up for the patient portal called "MyChart".  Sign up information is provided on this After Visit Summary.  MyChart is used to connect with patients for Virtual Visits (Telemedicine).  Patients are able to view lab/test results, encounter notes, upcoming appointments, etc.  Non-urgent messages can be sent to your provider as well.   To learn more about what you can do with MyChart, go to [email or web address].    Your next appointment:   1 year(s)  The format for your next appointment:   In Person  Provider:   [name], MD     Other Instructions Adopting a  Healthy Lifestyle.   Weight: Know what a healthy weight is for you (roughly BMI <25) and aim to maintain this. You can calculate your body mass index on your smart phone  Diet: Aim for 7+ servings of fruits and vegetables daily Limit animal fats in diet for cholesterol and heart health - choose grass fed whenever available Avoid highly processed foods (fast food burgers, tacos, fried chicken, pizza, hot dogs, french fries)  Saturated fat comes in the form of butter, lard, coconut oil, margarine, partially hydrogenated oils, and fat in meat. These increase your risk of cardiovascular disease.  Use healthy plant oils, such as olive, canola, soy, corn, sunflower and peanut.  Whole foods such as fruits, vegetables and whole grains have fiber  Men need > 38 grams of fiber per day Women need > 25 grams of fiber per day  Load up on vegetables and fruits - one-half of your plate: Aim for color and variety, and remember that potatoes dont count. Go for whole grains - one-quarter of your plate: Whole wheat, barley, wheat berries, quinoa, oats, brown rice, and foods made with them. If you want pasta, go with whole wheat pasta. Protein power - one-quarter of your plate: Fish, chicken, beans, and nuts are all healthy, versatile protein sources. Limit red meat. You need carbohydrates for energy! The type of carbohydrate is more important than the amount. Choose carbohydrates such as vegetables, fruits, whole grains, beans, and nuts in the place of white rice, white pasta, potatoes (baked or  fried), macaroni and cheese, cakes, cookies, and donuts.  If youre thirsty, drink water. Coffee and tea are good in moderation, but skip sugary drinks and limit milk and dairy products to one or two daily servings. Keep sugar intake at 6 teaspoons or 24 grams or LESS       Exercise: Aim for 150 min of moderate intensity exercise weekly for heart health, and weights twice weekly for bone health Stay active - any steps  are better than no steps! Aim for 7-9 hours of sleep daily        Important Information About Sugar

## 2022-10-25 ENCOUNTER — Ambulatory Visit: Payer: PPO | Attending: Nurse Practitioner

## 2022-10-25 DIAGNOSIS — E785 Hyperlipidemia, unspecified: Secondary | ICD-10-CM

## 2022-10-25 DIAGNOSIS — I2510 Atherosclerotic heart disease of native coronary artery without angina pectoris: Secondary | ICD-10-CM | POA: Diagnosis not present

## 2022-10-25 LAB — COMPREHENSIVE METABOLIC PANEL
ALT: 21 IU/L (ref 0–44)
AST: 21 IU/L (ref 0–40)
Albumin/Globulin Ratio: 1.8 (ref 1.2–2.2)
Albumin: 4.4 g/dL (ref 3.8–4.8)
Alkaline Phosphatase: 42 IU/L — ABNORMAL LOW (ref 44–121)
BUN/Creatinine Ratio: 17 (ref 10–24)
BUN: 18 mg/dL (ref 8–27)
Bilirubin Total: 0.4 mg/dL (ref 0.0–1.2)
CO2: 25 mmol/L (ref 20–29)
Calcium: 9.7 mg/dL (ref 8.6–10.2)
Chloride: 102 mmol/L (ref 96–106)
Creatinine, Ser: 1.07 mg/dL (ref 0.76–1.27)
Globulin, Total: 2.5 g/dL (ref 1.5–4.5)
Glucose: 194 mg/dL — ABNORMAL HIGH (ref 70–99)
Potassium: 3.8 mmol/L (ref 3.5–5.2)
Sodium: 142 mmol/L (ref 134–144)
Total Protein: 6.9 g/dL (ref 6.0–8.5)
eGFR: 73 mL/min/{1.73_m2} (ref >59)

## 2022-10-25 LAB — LIPID PANEL
Chol/HDL Ratio: 2.4 ratio (ref 0.0–5.0)
Cholesterol, Total: 106 mg/dL (ref 100–199)
HDL: 44 mg/dL (ref >39)
LDL Chol Calc (NIH): 49 mg/dL (ref 0–99)
Triglycerides: 60 mg/dL (ref 0–149)
VLDL Cholesterol Cal: 13 mg/dL (ref 5–40)

## 2022-10-28 NOTE — Progress Notes (Signed)
Pt has been made aware of normal result and verbalized understanding.   He has been made aware that his blood sugar is elevated and to make surehe is following up with his primary care provider.

## 2022-11-09 ENCOUNTER — Encounter: Payer: Self-pay | Admitting: Family Medicine

## 2022-11-09 ENCOUNTER — Ambulatory Visit (INDEPENDENT_AMBULATORY_CARE_PROVIDER_SITE_OTHER): Payer: PPO | Admitting: Family Medicine

## 2022-11-09 VITALS — BP 104/72 | HR 69 | Temp 98.0°F | Ht 70.0 in | Wt 228.6 lb

## 2022-11-09 DIAGNOSIS — H539 Unspecified visual disturbance: Secondary | ICD-10-CM | POA: Diagnosis not present

## 2022-11-09 DIAGNOSIS — I2510 Atherosclerotic heart disease of native coronary artery without angina pectoris: Secondary | ICD-10-CM | POA: Diagnosis not present

## 2022-11-09 DIAGNOSIS — E1140 Type 2 diabetes mellitus with diabetic neuropathy, unspecified: Secondary | ICD-10-CM | POA: Diagnosis not present

## 2022-11-09 DIAGNOSIS — I10 Essential (primary) hypertension: Secondary | ICD-10-CM | POA: Diagnosis not present

## 2022-11-09 LAB — POCT GLYCOSYLATED HEMOGLOBIN (HGB A1C): Hemoglobin A1C: 7.9 % — AB (ref 4.0–5.6)

## 2022-11-09 NOTE — Progress Notes (Signed)
Established Patient Office Visit   Subjective  Patient ID: John Hobbs, male    DOB: 09-09-1946  Age: 77 y.o. MRN: [id number]  Chief Complaint  Patient presents with   Medical Management of Chronic Issues    Following up DM.     Patient seen for follow-up on chronic conditions.  Patient states blood sugar elevates throughout the day no matter what he eats then returns to normal in the evening.  Highest blood sugar 250.  Followed by endocrinology.  Started on Humalog 75/25 8 units a few months ago states he does not have enough medication each month is only given 2 pens.  Also taking metformn 1000 mg twice daily, Prandin 3 times daily, Amaryl.  Patient had heart cath in December.  States that if he needs another procedure would prefer to be asleep as he is too nervous.  Denies recent chest pain.  Pt with history of cataract removal.  Endorses two episodes of disorientation while driving at night due to the bright lights from other cars.  Tries to avoid driving at night when possible.      ROS Negative unless stated above    Objective:     BP 104/72 (BP Location: Right Arm, Patient Position: Sitting, Cuff Size: Large)   Pulse 69   Temp 98 F (36.7 C) (Oral)   Ht '5\' 10"'$  (1.778 m)   Wt 228 lb 9.6 oz (103.7 kg)   SpO2 96%   BMI 32.80 kg/m    Physical Exam Constitutional:      Appearance: Normal appearance.  HENT:     Head: Normocephalic and atraumatic.     Nose: Nose normal.     Mouth/Throat:     Mouth: Mucous membranes are moist.  Eyes:     Extraocular Movements: Extraocular movements intact.     Conjunctiva/sclera: Conjunctivae normal.     Pupils: Pupils are equal, round, and reactive to light.  Cardiovascular:     Rate and Rhythm: Normal rate.     Heart sounds: Normal heart sounds.  Pulmonary:     Effort: Pulmonary effort is normal.     Breath sounds: Normal breath sounds.  Skin:    General: Skin is dry.      Results for orders placed or performed in  visit on 11/09/22  POC HgB A1c  Result Value Ref Range   Hemoglobin A1C 7.9 (A) 4.0 - 5.6 %   HbA1c POC (<> result, manual entry)     HbA1c, POC (prediabetic range)     HbA1c, POC (controlled diabetic range)        Assessment & Plan:  Type 2 diabetes mellitus with diabetic neuropathy, without long-term current use of insulin (HCC) -Hemoglobin A1c 7.9% this visit -Continue current medications -Discussed increasing Humalog 75/25 from 8 units to 10 units daily -Patient encouraged to schedule follow-up with endocrinology -Continue ARB and statin -     POCT glycosylated hemoglobin (Hb A1C)  Coronary artery disease involving native coronary artery of native heart without angina pectoris -Stable.  Currently asymptomatic -Status post left heart cath 10/06/2022 -Continue current medications including Imdur, atorvastatin 20 mg daily -Continue follow-up cardiology -Continue lifestyle modifications  Essential hypertension -Controlled -Continue current medications including losartan 50 mg daily, Norvasc 5 mg daily  Change in vision -Disorientation caused by bright lights while driving at night -Advised to schedule follow-up with ophthalmology -Also discussed the importance of glycemic control   Return if symptoms worsen or fail to improve.   Larene Beach  Merlene Laughter, MD

## 2022-11-09 NOTE — Patient Instructions (Signed)
Increase Humalog 75/25 to 10 units daily.  Schedule follow-up appointment with endocrinology.  Also schedule follow-up with ophthalmology.

## 2022-11-15 ENCOUNTER — Telehealth: Payer: HMO

## 2022-11-15 DIAGNOSIS — E1165 Type 2 diabetes mellitus with hyperglycemia: Secondary | ICD-10-CM | POA: Diagnosis not present

## 2022-11-15 DIAGNOSIS — I10 Essential (primary) hypertension: Secondary | ICD-10-CM | POA: Diagnosis not present

## 2022-11-15 DIAGNOSIS — E162 Hypoglycemia, unspecified: Secondary | ICD-10-CM | POA: Diagnosis not present

## 2022-11-15 DIAGNOSIS — E7800 Pure hypercholesterolemia, unspecified: Secondary | ICD-10-CM | POA: Diagnosis not present

## 2022-12-04 ENCOUNTER — Emergency Department (HOSPITAL_COMMUNITY): Payer: PPO

## 2022-12-04 ENCOUNTER — Emergency Department (HOSPITAL_COMMUNITY)
Admission: EM | Admit: 2022-12-04 | Discharge: 2022-12-04 | Disposition: A | Discharge status: Home / Self Care | Payer: PPO | Attending: Emergency Medicine | Admitting: Emergency Medicine

## 2022-12-04 ENCOUNTER — Encounter (HOSPITAL_COMMUNITY): Payer: Self-pay

## 2022-12-04 ENCOUNTER — Other Ambulatory Visit: Payer: Self-pay

## 2022-12-04 DIAGNOSIS — Z7984 Long term (current) use of oral hypoglycemic drugs: Secondary | ICD-10-CM | POA: Insufficient documentation

## 2022-12-04 DIAGNOSIS — Z794 Long term (current) use of insulin: Secondary | ICD-10-CM | POA: Insufficient documentation

## 2022-12-04 DIAGNOSIS — M79602 Pain in left arm: Secondary | ICD-10-CM

## 2022-12-04 DIAGNOSIS — Z79899 Other long term (current) drug therapy: Secondary | ICD-10-CM | POA: Diagnosis not present

## 2022-12-04 DIAGNOSIS — E876 Hypokalemia: Secondary | ICD-10-CM | POA: Insufficient documentation

## 2022-12-04 DIAGNOSIS — I10 Essential (primary) hypertension: Secondary | ICD-10-CM | POA: Insufficient documentation

## 2022-12-04 DIAGNOSIS — Z7982 Long term (current) use of aspirin: Secondary | ICD-10-CM | POA: Insufficient documentation

## 2022-12-04 DIAGNOSIS — E119 Type 2 diabetes mellitus without complications: Secondary | ICD-10-CM | POA: Insufficient documentation

## 2022-12-04 DIAGNOSIS — I700 Atherosclerosis of aorta: Secondary | ICD-10-CM | POA: Diagnosis not present

## 2022-12-04 DIAGNOSIS — M25512 Pain in left shoulder: Secondary | ICD-10-CM | POA: Diagnosis not present

## 2022-12-04 DIAGNOSIS — I2510 Atherosclerotic heart disease of native coronary artery without angina pectoris: Secondary | ICD-10-CM | POA: Diagnosis not present

## 2022-12-04 LAB — BASIC METABOLIC PANEL
Anion gap: 13 (ref 5–15)
BUN: 13 mg/dL (ref 8–23)
CO2: 21 mmol/L — ABNORMAL LOW (ref 22–32)
Calcium: 9.2 mg/dL (ref 8.9–10.3)
Chloride: 102 mmol/L (ref 98–111)
Creatinine, Ser: 1.08 mg/dL (ref 0.61–1.24)
GFR, Estimated: >60 mL/min (ref >60)
Glucose, Bld: 180 mg/dL — ABNORMAL HIGH (ref 70–99)
Potassium: 3.3 mmol/L — ABNORMAL LOW (ref 3.5–5.1)
Sodium: 136 mmol/L (ref 135–145)

## 2022-12-04 LAB — CBC
HCT: 40.8 % (ref 39.0–52.0)
Hemoglobin: 13.3 g/dL (ref 13.0–17.0)
MCH: 28.5 pg (ref 26.0–34.0)
MCHC: 32.6 g/dL (ref 30.0–36.0)
MCV: 87.4 fL (ref 80.0–100.0)
Platelets: 239 10*3/uL (ref 150–400)
RBC: 4.67 MIL/uL (ref 4.22–5.81)
RDW: 14.2 % (ref 11.5–15.5)
WBC: 5.8 10*3/uL (ref 4.0–10.5)
nRBC: 0 % (ref 0.0–0.2)

## 2022-12-04 LAB — TROPONIN I (HIGH SENSITIVITY)
Troponin I (High Sensitivity): 4 ng/L (ref <18)
Troponin I (High Sensitivity): 4 ng/L (ref <18)

## 2022-12-04 MED ORDER — MORPHINE SULFATE (PF) 4 MG/ML IV SOLN
4.0000 mg | Freq: Once | INTRAVENOUS | Status: AC
  Administered 2022-12-04: 4 mg via INTRAVENOUS
  Filled 2022-12-04: qty 1

## 2022-12-04 MED ORDER — IOHEXOL 350 MG/ML SOLN
100.0000 mL | Freq: Once | INTRAVENOUS | Status: AC | PRN
  Administered 2022-12-04: 100 mL via INTRAVENOUS

## 2022-12-04 NOTE — ED Provider Notes (Signed)
[facility] Provider Note   CSN: [id number] Arrival date & time: 12/04/22  1034     History  Chief Complaint  Patient presents with   Arm Pain   Shoulder Pain    John Hobbs is a 77 y.o. male.  The history is provided by the patient and medical records. No language interpreter was used.  Arm Pain  Shoulder Pain    77 year old male significant history of diabetes, hypertension, Nash, obesity, chronic left shoulder pain with impingement syndrome of the left shoulder presenting today with complaint of left shoulder pain.  Patient reports approximate 2 hours ago he developed pain to his left arm.  He described pain as an achy sharp sensation starting in his left arm and radiates towards his left shoulder and has been persistent.  He was sitting and watching TV when this happened.  He denies any associated headache, lightheadedness, dizziness, nausea, vomiting, diaphoresis, chest pain, shortness of breath, abdominal pain or neck pain.  When asked about his shoulder impingement syndrome patient states he does not recall having it.  He denies any specific treatment tried.  He denies any recent heavy lifting or strenuous activities.  He is right-hand dominant.  Home Medications Prior to Admission medications   Medication Sig Start Date End Date Taking? Authorizing Provider  acetaminophen (TYLENOL) 500 MG tablet Take 1,000 mg by mouth daily.    [provider]  amLODipine (NORVASC) 5 MG tablet TAKE 1 TABLET BY MOUTH DAILY 07/25/22   Billie Ruddy, MD  aspirin EC 81 MG tablet Take 81 mg by mouth daily after breakfast.    [provider]  atenolol-chlorthalidone (TENORETIC) 100-25 MG tablet TAKE 1/2 TABLET BY MOUTH DAILY 06/08/22   Billie Ruddy, MD  atorvastatin (LIPITOR) 20 MG tablet TAKE 1 TABLET BY MOUTH EVERY DAY 08/22/22   Billie Ruddy, MD  Continuous Blood Gluc Sensor (FREESTYLE LIBRE 3 SENSOR) MISC 1 Device by  Does not apply route every 14 (fourteen) days. Place 1 sensor on the skin every 14 days. Use to check glucose continuously 02/09/22   Billie Ruddy, MD  fexofenadine (ALLEGRA) 180 MG tablet Take 1 tablet (180 mg total) by mouth daily. 10/08/21   Billie Ruddy, MD  glimepiride (AMARYL) 1 MG tablet TAKE 1 TABLET(1 MG) BY MOUTH DAILY WITH SUPPER 06/01/22   Billie Ruddy, MD  glimepiride (AMARYL) 4 MG tablet Take 2 mg by mouth daily with breakfast.    [provider]  glucose blood test strip Use as instructed for checking fsbs TID. 09/18/19   Billie Ruddy, MD  Insulin Lispro Prot & Lispro (HUMALOG MIX 75/25 KWIKPEN) (75-25) 100 UNIT/ML Kwikpen Inject 8 Units into the skin daily.    [provider]  isosorbide mononitrate (IMDUR) 30 MG 24 hr tablet Take 0.5 tablets (15 mg total) by mouth daily. 09/19/22   Burnell Blanks, MD  Lancets [facility] ULTRASOFT) lancets Use to check blood sugar once daily 01/22/20   Billie Ruddy, MD  losartan (COZAAR) 50 MG tablet TAKE 1 TABLET(50 MG) BY MOUTH DAILY 12/07/21   Billie Ruddy, MD  metFORMIN (GLUCOPHAGE) 1000 MG tablet TAKE 1 TABLET(1000 MG) BY MOUTH TWICE DAILY WITH A MEAL 08/31/22   Billie Ruddy, MD  ONE TOUCH LANCETS MISC Test once day 03/26/14   Lucretia Kern, DO  ONETOUCH VERIO test strip USE AS DIRECTED TO TEST BLOOD SUGAR ONCE DAILY 01/24/22   Volanda Napoleon,  Langley Adie, MD  potassium chloride SA (KLOR-CON M) 20 MEQ tablet TAKE 1/2 TABLET( 10 MEQ) BY MOUTH DAILY 07/18/22   Billie Ruddy, MD  repaglinide (PRANDIN) 2 MG tablet Take 2 tabs (76m) before breakfast, 2 tabs (4 mg) before lunch, and 3 tabs (6 mg) before dinner. 10/08/21   BBillie Ruddy MD  repaglinide (PRANDIN) 2 MG tablet TAKE 2 TABLETS BY MOUTH DAILY BEFORE BREAKFAST, 2 TABLETS BEFORE LUNCH, AND 1 TABLET BEFORE DINNER 02/28/22   BBillie Ruddy MD      Allergies    Lisinopril    Review of Systems   Review of Systems  All other systems reviewed and are  negative.   Physical Exam Updated Vital Signs BP 114/78   Pulse (!) 51   Temp (!) 97.4 F (36.3 C) (Oral)   Resp 17   SpO2 99%  Physical Exam Vitals and nursing note reviewed.  Constitutional:      General: He is not in acute distress.    Appearance: He is well-developed.  HENT:     Head: Atraumatic.  Eyes:     Conjunctiva/sclera: Conjunctivae normal.  Neck:     Comments: No midline spine tenderness; neck with full range of motion Cardiovascular:     Rate and Rhythm: Normal rate and regular rhythm.     Pulses: Normal pulses.     Heart sounds: Normal heart sounds.  Pulmonary:     Effort: Pulmonary effort is normal.     Breath sounds: Normal breath sounds.  Abdominal:     Palpations: Abdomen is soft.  Musculoskeletal:        General: No tenderness (Left arm without any reproducible focal tenderness.  Arm with full motion, radial pulse 2+, sensation is tact throughout and able to move at all joints.).     Cervical back: Neck supple.  Skin:    Findings: No rash.  Neurological:     Mental Status: He is alert.     ED Results / Procedures / Treatments   Labs (all labs ordered are listed, but only abnormal results are displayed) Labs Reviewed  BASIC METABOLIC PANEL - Abnormal; Notable for the following components:      Result Value   Potassium 3.3 (*)    CO2 21 (*)    Glucose, Bld 180 (*)    All other components within normal limits  CBC  TROPONIN I (HIGH SENSITIVITY)  TROPONIN I (HIGH SENSITIVITY)    EKG EKG Interpretation  Date/Time:  Sunday December 04 2022 11:45:58 EST Ventricular Rate:  61 PR Interval:  237 QRS Duration: 95 QT Interval:  408 QTC Calculation: 411 R Axis:   -67 Text Interpretation: Sinus rhythm Prolonged PR interval Inferior infarct, old Consider anterior infarct no significant change since 2020 Confirmed by GSherwood Gambler([phone] on 12/04/2022 11:47:44 AM  Radiology CT Angio Chest/Abd/Pel for Dissection W and/or Wo Contrast  Result  Date: 12/04/2022 CLINICAL DATA:  Acute aortic syndrome suspected EXAM: CT ANGIOGRAPHY CHEST, ABDOMEN AND PELVIS TECHNIQUE: Non-contrast CT of the chest was initially obtained. Multidetector CT imaging through the chest, abdomen and pelvis was performed using the standard protocol during bolus administration of intravenous contrast. Multiplanar reconstructed images and MIPs were obtained and reviewed to evaluate the vascular anatomy. RADIATION DOSE REDUCTION: This exam was performed according to the departmental dose-optimization program which includes automated exposure control, adjustment of the mA and/or kV according to patient size and/or use of iterative reconstruction technique. CONTRAST:  1097mOMNIPAQUE IOHEXOL 350 MG/ML  SOLN COMPARISON:  None Available. FINDINGS: CTA CHEST FINDINGS Cardiovascular: Noncontrast series demonstrates no intramural hematoma within the thoracic aorta. IV contrast series demonstrates no evidence of aortic dissection or aneurysm. Great vessels normal. No pericardial fluid. No evidence of acute pulmonary embolism. Coronary artery calcification and aortic atherosclerotic calcification. Mediastinum/Nodes: No axillary or supraclavicular adenopathy. No mediastinal or hilar adenopathy. No pericardial fluid. Esophagus normal. Lungs/Pleura: No pulmonary infarction. No pneumonia. No pleural fluid. No pneumothorax Musculoskeletal: No acute osseous abnormality. Review of the MIP images confirms the above findings. CTA ABDOMEN AND PELVIS FINDINGS VASCULAR Aorta: Normal caliber aorta without aneurysm, dissection, vasculitis or significant stenosis. Celiac: Patent without evidence of aneurysm, dissection, vasculitis or significant stenosis. SMA: Patent without evidence of aneurysm, dissection, vasculitis or significant stenosis. Renals: Both renal arteries are patent without evidence of aneurysm, dissection, vasculitis, fibromuscular dysplasia or significant stenosis. IMA: Patent without evidence  of aneurysm, dissection, vasculitis or significant stenosis. Inflow: Patent without evidence of aneurysm, dissection, vasculitis or significant stenosis. Veins: No obvious venous abnormality within the limitations of this arterial phase study. Review of the MIP images confirms the above findings. NON-VASCULAR Hepatobiliary: No focal hepatic lesion. Normal gallbladder. No biliary duct dilatation. Common bile duct is normal. Pancreas: Pancreas is normal. No ductal dilatation. No pancreatic inflammation. Spleen: Normal spleen Adrenals/urinary tract: Adrenal glands and kidneys are normal. The ureters and bladder normal. Stomach/Bowel: Stomach, small bowel, appendix, and cecum are normal. The colon and rectosigmoid colon are normal. Vascular/Lymphatic: Abdominal aorta is normal caliber with atherosclerotic calcification. There is no retroperitoneal or periportal lymphadenopathy. No pelvic lymphadenopathy. Reproductive: Prostate unremarkable Other: No free fluid. Musculoskeletal: No aggressive osseous lesion. Review of the MIP images confirms the above findings. IMPRESSION: CHEST IMPRESSION: 1. No evidence of aortic dissection or aneurysm. 2. No evidence of acute pulmonary embolism. 3. No acute pulmonary parenchymal findings. PELVIS IMPRESSION: 1. No evidence of aortic dissection or aneurysm. 2. No acute findings in the abdomen pelvis. 3. Atherosclerotic calcification of the abdominal aorta. Electronically Signed   By: Suzy Bouchard M.D.   On: 12/04/2022 14:29   DG Chest Port 1 View  Result Date: 12/04/2022 CLINICAL DATA:  77 year old male with history of arm pain. EXAM: PORTABLE CHEST 1 VIEW COMPARISON:  Chest x-ray 09/04/2019. FINDINGS: Lung volumes are normal. No consolidative airspace disease. No pleural effusions. No pneumothorax. No pulmonary nodule or mass noted. Atherosclerosis in the thoracic aorta, which appears likely mildly aneurysmal, with the aortic arch estimated to measure approximately 4.5 cm in  diameter. Pulmonary vasculature and the cardiomediastinal silhouette are otherwise within normal limits. IMPRESSION: 1. No radiographic evidence of acute cardiopulmonary disease. 2. Aortic atherosclerosis with probable thoracic aortic aneurysm. Further evaluation with nonemergent chest CTA should be considered in the near future to establish a baseline for future follow-up imaging. If there is clinical concern for acute aortic dissection as an etiology of the patient's arm pain, chest CTA should be obtained at this time. Electronically Signed   By: Vinnie Langton M.D.   On: 12/04/2022 11:45    Procedures Procedures    Medications Ordered in ED Medications  morphine (PF) 4 MG/ML injection 4 mg (4 mg Intravenous Given 12/04/22 1138)  iohexol (OMNIPAQUE) 350 MG/ML injection 100 mL (100 mLs Intravenous Contrast Given 12/04/22 1401)    ED Course/ Medical Decision Making/ A&P                             Medical Decision Making Amount and/or  Complexity of Data Reviewed Labs: ordered. Radiology: ordered.  Risk Prescription drug management.   BP 124/87   Pulse 63   Temp (!) 97.4 F (36.3 C) (Oral)   Resp 16   SpO2 95%   45:73 AM 77 year old male significant history of diabetes, hypertension, Nash, obesity, chronic left shoulder pain with impingement syndrome of the left shoulder presenting today with complaint of left shoulder pain.  Patient reports approximate 2 hours ago he developed pain to his left arm.  He described pain as an achy sharp sensation starting in his left arm and radiates towards his left shoulder and has been persistent.  He was sitting and watching TV when this happened.  He denies any associated headache, lightheadedness, dizziness, nausea, vomiting, diaphoresis, chest pain, shortness of breath, abdominal pain or neck pain.  When asked about his shoulder impingement syndrome patient states he does not recall having it.  He denies any specific treatment tried.  He denies  any recent heavy lifting or strenuous activities.  He is right-hand dominant.  On exam this is a well-appearing elderly male laying in bed appears to be in no acute discomfort.  Heart with normal rate and rhythm, lungs are clear to auscultation bilaterally abdomen soft nontender, palpation of the left upper arm without any focal point tenderness.  He has normal skin turgor, normal sensation, compartments soft, radial pulse 2+, he is able to range his left shoulder elbow and wrist with full range of motion.  He does not have any midline cervical spine tenderness.  Given his history of hypertension diabetes and having atraumatic left shoulder pain, cardiac workup initiated.  Will provide opiate pain medication for symptom control.  -Labs ordered, independently viewed and interpreted by me.  Labs remarkable for negative delta trop, doubt ACS.  Mild hypokalemia with K+ 3.3, supplementation given.  CBG elevated at 180 with normal anion gap, no acidosis.  -The patient was maintained on a cardiac monitor.  I personally viewed and interpreted the cardiac monitored which showed an underlying rhythm of: sinus bradycardia -Imaging independently viewed and interpreted by me and I agree with radiologist's interpretation.  Result remarkable for CXR shows aortic arthrosclerosis with probable thoracic aortic aneurysm.  Will obtain dissection study. -This patient presents to the ED for concern of arm pain, this involves an extensive number of treatment options, and is a complaint that carries with it a high risk of complications and morbidity.  The differential diagnosis includes radicular pain, muscle strain, claudication, ACS, dissection -Co morbidities that complicate the patient evaluation includes DM, HTN, obesity -Treatment includes morphine -Reevaluation of the patient after these medicines showed that the patient improved -PCP office notes or outside notes reviewed -Discussion with attending Dr.  Regenia Skeeter -Escalation to admission/observation considered: patients feels much better, is comfortable with discharge, and will follow up with PCP -Prescription medication considered, patient comfortable with tylenol, flexeril -Social Determinant of Health considered which includes lack of physical activity  2:48 PM   Initial chest x-ray shows evidence of aortic atherosclerosis with probable thoracic aortic aneurysm.  CT scan of the chest abdomen pelvis to assess for potential dissection or aneurysm was performed.  Fortunately evidence of aortic dissection or aneurysm and no acute finding on the abdomen or pelvis.  Patient does have evidence of atherosclerosis calcification of the abdominal aorta.   On reassessment patient is resting comfortably and in no acute acute discomfort.  I discussed finding of workup today and encouraged patient to follow-up with his primary care provider for  further care, return precaution given.  Otherwise he is stable to be discharged home with supportive care.        Final Clinical Impression(s) / ED Diagnoses Final diagnoses:  None    Rx / DC Orders ED Discharge Orders     None         Domenic Moras, PA-C 12/04/22 1454    Sherwood Gambler, MD 12/07/22 [phone]

## 2022-12-04 NOTE — ED Triage Notes (Signed)
Pt came in pov d/t L arm and shoulder pain. Pt stated it started an hour ago. It starts on the L and radiates to the shoulder. Able to move 4 extremities. Pt also c/o L back pain. Rated pain 4/10. A&O X4.

## 2022-12-04 NOTE — Discharge Instructions (Signed)
You have been evaluated for your arm pain.  Fortunately no concerning findings were noted on today's exam.  Your heart evaluation did not show any concerning finding and CT scan today shows you have evidence of calcification buildup in your aorta.  Please follow-up closely with your primary care doctor for further care, continue to take Tylenol as needed for pain at home.  Return to ER if your symptoms worsen or if you have other concern.

## 2022-12-07 ENCOUNTER — Other Ambulatory Visit: Payer: Self-pay | Admitting: Family Medicine

## 2022-12-07 ENCOUNTER — Inpatient Hospital Stay: Payer: PPO | Admitting: Family Medicine

## 2022-12-07 DIAGNOSIS — I10 Essential (primary) hypertension: Secondary | ICD-10-CM

## 2022-12-12 ENCOUNTER — Telehealth: Payer: Self-pay

## 2022-12-12 NOTE — Telephone Encounter (Signed)
        Patient  visited [facility] on 12/04/2022  for left arm pain, shoulder pain.   Telephone encounter attempt :  1st  Unable to leave message home and mobile voicemail not setup.   [facility] Resource Care Guide   [email or web address]  ?? [phone]   Website: [email or web address]  [email or web address]

## 2022-12-13 ENCOUNTER — Telehealth: Payer: Self-pay

## 2022-12-13 NOTE — Telephone Encounter (Signed)
     Patient  visit on 12/04/2022  at [facility] was for left arm pain,shoulder pain.  Have you been able to follow up with your primary care physician? Patient stated that he is feeling better.  The patient was or was not able to obtain any needed medicine or equipment. No medication prescribed.  Are there diet recommendations that you are having difficulty following? No  Patient expresses understanding of discharge instructions and education provided has no other needs at this time. Yes   [facility] Resource Care Guide   [email or web address]  ?? [phone]   Website: [email or web address]  [email or web address]

## 2022-12-17 ENCOUNTER — Other Ambulatory Visit: Payer: Self-pay | Admitting: Family Medicine

## 2022-12-17 DIAGNOSIS — I10 Essential (primary) hypertension: Secondary | ICD-10-CM

## 2022-12-22 ENCOUNTER — Telehealth: Payer: Self-pay | Admitting: Family Medicine

## 2022-12-22 DIAGNOSIS — I10 Essential (primary) hypertension: Secondary | ICD-10-CM

## 2022-12-22 NOTE — Telephone Encounter (Signed)
Pt wife is calling and pt last seen dr banks on 11-09-2022 and would like a refill on losartan (COZAAR) 50 MG tablet  [facility] [id number] - [place], [address] Phone: [phone]  Fax: [phone]

## 2022-12-23 MED ORDER — LOSARTAN POTASSIUM 50 MG PO TABS: ORAL_TABLET | ORAL | 3 refills | Status: DC

## 2022-12-23 NOTE — Telephone Encounter (Signed)
Rx sent. Pt's wife is aware.

## 2023-01-18 ENCOUNTER — Other Ambulatory Visit: Payer: Self-pay | Admitting: Family Medicine

## 2023-01-18 DIAGNOSIS — E1149 Type 2 diabetes mellitus with other diabetic neurological complication: Secondary | ICD-10-CM

## 2023-02-07 ENCOUNTER — Telehealth: Payer: Self-pay

## 2023-02-07 NOTE — Progress Notes (Signed)
Care Management & Coordination Services Pharmacy Team  Reason for Encounter: Diabetes  Contacted patient to discuss diabetes disease state. Spoke with patient on 02/07/2023   Current antihyperglycemic regimen:  Glimepiride 4 mg take 2 mg daily with breakfast Humalog kwikpen75/25 inject 10 mg daily Metformin 1000 mg twice daily  Patient verbally confirms he is taking the above medications as directed. Yes  What diet changes have been made to improve diabetes control? Patient tries to follow a lower carb diet  What recent interventions/DTPs have been made to improve glycemic control:  Patient states Humalog recently increased from 8 units to 10 units daily.  Have there been any recent hospitalizations or ED visits since last visit with PharmD? Yes, notes below.   Patient reports hypoglycemic symptoms, including  nervous stomach  Patient reports hyperglycemic symptoms, including blurry vision  How often are you checking your blood sugar? Patient is checking at least twice a day  What are your blood sugars ranging?  Fasting: patient states his reading first thing in the morning is around 140 After meals: patient states his readings vary from slightly below 70 to up in the 200's depending on what he has eaten.   During the week, how often does your blood glucose drop below 70? Patient states this happen 2-3 times per month, he has a CGM and it will notify him as soon as it drops below 70, he has only seen it down to 68, he will eat something and blood sugars will come back up quickly.   Are you checking your feet daily/regularly? Patient states he checks daily  Adherence Review: Is the patient currently on a STATIN medication? Yes Is the patient currently on ACE/ARB medication? Yes Does the patient have >5 day gap between last estimated fill dates? Yes  Care Gaps: AWV - completed 06/09/2022 Last eye exam - 01/11/2022 Last foot exam - 02/17/2021 Last BP - 104/72 on  11/09/2022 Last A1C - 7.9 on 11/09/2022 Shingrix - never done Tdap - overdue Urine ACR - overdue Covid - overdue   Star Rating Drugs: Atorvastatin 20 mg  - last filled 11/20/2022 90 DS at [facility] Losartan 50 mg  - last filled 01/02/2022 90 DS at [facility] verified with pharm Repaglinide 2 mg - last filled 02/28/2022 90 DS at [facility] verified with pharm Metformin 1000 mg  - last filled 12/06/2022 90 DS at [facility] verified with pharm Glimepiride 4 mg - last filled 11/20/2022 30 DS at [facility]   Chart Updates:  Recent office visits:  11/09/2022 Abbe Amsterdam MD - Patient was seen for Type 2 diabetes mellitus with diabetic neuropathy, without long-term current use of insulin and additional concerns. No medication changes.   08/12/2022 Abbe Amsterdam MD - Patient was seen for Chest pain and additional concerns.   Recent consult visits:  10/21/2022 Eligha Bridegroom NP (cardiology) - Patient was seen for hyperlipidemia and additional concerns. No medication changes.   09/19/2022 Verne Carrow (cardiology) - Patient was seen for Pre-procedure lab exam and an additional concern. Started Isosorbide 15 mg daily.   08/15/2022 Verne Carrow MD(cardiology) - Patient was seen for chest pain of uncertain etiology. Started Metoprolol 100 mg once prior to scan. Discontinued Tramadol.   Hospital visits:  Patient was seen at [facility] ED on 12/04/2022 (4 hours) due to left arm pain.    New?Medications Started at [facility] Discharge:?? None Medication Changes at Hospital Discharge: None Medications Discontinued at Hospital Discharge: None Medications that remain the same after Hospital Discharge:??  -  All other medications will remain the same.     Admitted to [facility] on due to 10/06/2022 (5 hours).    New?Medications Started at [facility] Discharge:?? None Medication Changes at Hospital Discharge: None Medications Discontinued at Hospital  Discharge: None Medications that remain the same after Hospital Discharge:??  -All other medications will remain the same.  Medications: Outpatient Encounter Medications as of 02/07/2023  Medication Sig   acetaminophen (TYLENOL) 500 MG tablet Take 1,000 mg by mouth daily.   amLODipine (NORVASC) 5 MG tablet TAKE 1 TABLET BY MOUTH DAILY   aspirin EC 81 MG tablet Take 81 mg by mouth daily after breakfast.   atenolol-chlorthalidone (TENORETIC) 100-25 MG tablet TAKE 1/2 TABLET BY MOUTH DAILY   atorvastatin (LIPITOR) 20 MG tablet TAKE 1 TABLET BY MOUTH EVERY DAY   Continuous Blood Gluc Sensor (FREESTYLE LIBRE 3 SENSOR) MISC USE TO CHECK GLUCOSE CONTINUOUSLY, CHANGE EVERY 14 DAYS AS DIRECTED   fexofenadine (ALLEGRA) 180 MG tablet Take 1 tablet (180 mg total) by mouth daily.   glimepiride (AMARYL) 1 MG tablet TAKE 1 TABLET(1 MG) BY MOUTH DAILY WITH SUPPER   glimepiride (AMARYL) 4 MG tablet Take 2 mg by mouth daily with breakfast.   glucose blood test strip Use as instructed for checking fsbs TID.   Insulin Lispro Prot & Lispro (HUMALOG MIX 75/25 KWIKPEN) (75-25) 100 UNIT/ML Kwikpen Inject 8 Units into the skin daily.   isosorbide mononitrate (IMDUR) 30 MG 24 hr tablet Take 0.5 tablets (15 mg total) by mouth daily.   Lancets (ONETOUCH ULTRASOFT) lancets Use to check blood sugar once daily   losartan (COZAAR) 50 MG tablet TAKE 1 TABLET(50 MG) BY MOUTH DAILY   metFORMIN (GLUCOPHAGE) 1000 MG tablet TAKE 1 TABLET(1000 MG) BY MOUTH TWICE DAILY WITH A MEAL   ONE TOUCH LANCETS MISC Test once day   ONETOUCH VERIO test strip USE AS DIRECTED TO TEST BLOOD SUGAR ONCE DAILY   potassium chloride SA (KLOR-CON M) 20 MEQ tablet TAKE 1/2 TABLET( 10 MEQ) BY MOUTH DAILY   repaglinide (PRANDIN) 2 MG tablet Take 2 tabs ( ) before breakfast, 2 tabs (4 mg) before lunch, and 3 tabs (6 mg) before dinner.   repaglinide (PRANDIN) 2 MG tablet TAKE 2 TABLETS BY MOUTH DAILY BEFORE BREAKFAST, 2 TABLETS BEFORE LUNCH, AND 1  TABLET BEFORE DINNER   No facility-administered encounter medications on file as of 02/07/2023.  Fill History:  Dispensed Days Supply Quantity Provider Pharmacy  REPAGLINIDE  TABLETS 02/28/2022 90 450 each      Dispensed Days Supply Quantity Provider Pharmacy  POTASSIUM CL ER TABLETS 10/15/2022 90 45 each      Dispensed Days Supply Quantity Provider Pharmacy  METFORMIN  TABLETS 08/31/2022 90 180 each      Dispensed Days Supply Quantity Provider Pharmacy  LOSARTAN  TABLETS 09/08/2022 90 90 each      Dispensed Days Supply Quantity Provider Pharmacy  ISOSORBIDE MONONITRATE  ER TABS 09/19/2022 90 45 each      Dispensed Days Supply Quantity Provider Pharmacy  INSULIN LISPRO PRT MIX 75/25KWIKPEN 11/15/2022 50 6 mL      Dispensed Days Supply Quantity Provider Pharmacy  GLIMEPIRIDE  TABLETS 11/20/2022 90 90 each      Dispensed Days Supply Quantity Provider Pharmacy  ATORVASTATIN  TABLETS 11/20/2022 90 90 each      Dispensed Days Supply Quantity Provider Pharmacy  ATENOLOL /CHLORTHAL  TABS 09/08/2022 90 45 each      Dispensed Days Supply Quantity Provider Pharmacy  AMLODIPINE BESYLATE 5MG  TABLETS 10/15/2022 90 90 each     Recent Relevant Labs: Lab Results  Component Value Date/Time   HGBA1C 7.9 (A) 11/09/2022 02:30 PM   HGBA1C 7.6 (A) 05/23/2022 11:34 AM   HGBA1C 8.3 (H) 01/30/2018 08:11 AM   HGBA1C 7.5 (H) 05/04/2017 10:11 AM   MICROALBUR 1.9 12/21/2020 10:04 AM   MICROALBUR 0.7 12/22/2015 09:02 AM    Kidney Function Lab Results  Component Value Date/Time   CREATININE 1.08 12/04/2022 11:16 AM   CREATININE 1.07 10/25/2022 08:59 AM   CREATININE 1.13 04/24/2020 02:07 PM   CREATININE 1.01 10/22/2013 10:11 AM   GFR 75.19 03/31/2021 09:11 AM   GFRNONAA >60 12/04/2022 11:16 AM   GFRNONAA 78 10/22/2013 10:11 AM   GFRAA >60 08/17/2019 02:48 PM   GFRAA >89 10/22/2013 10:11 AM    Inetta Fermo CMA  Clinical Pharmacist  Assistant [phone]

## 2023-02-14 DIAGNOSIS — I10 Essential (primary) hypertension: Secondary | ICD-10-CM | POA: Diagnosis not present

## 2023-02-14 DIAGNOSIS — E1165 Type 2 diabetes mellitus with hyperglycemia: Secondary | ICD-10-CM | POA: Diagnosis not present

## 2023-02-14 DIAGNOSIS — E162 Hypoglycemia, unspecified: Secondary | ICD-10-CM | POA: Diagnosis not present

## 2023-02-14 DIAGNOSIS — E7800 Pure hypercholesterolemia, unspecified: Secondary | ICD-10-CM | POA: Diagnosis not present

## 2023-02-22 ENCOUNTER — Telehealth: Payer: Self-pay | Admitting: Family Medicine

## 2023-02-22 DIAGNOSIS — E1149 Type 2 diabetes mellitus with other diabetic neurological complication: Secondary | ICD-10-CM

## 2023-02-22 MED ORDER — ATORVASTATIN CALCIUM 20 MG PO TABS: 20.0000 mg | ORAL_TABLET | Freq: Every day | ORAL | 1 refills | Status: DC

## 2023-02-22 MED ORDER — AMLODIPINE BESYLATE 5 MG PO TABS: 5.0000 mg | ORAL_TABLET | Freq: Every day | ORAL | 1 refills | Status: DC

## 2023-02-22 NOTE — Telephone Encounter (Signed)
This is why it is important to update med list.  Please contact pharmacy and or pt regarding dose of glimepiride.  It was likely refilled by pt's Endocrinologist, Dr. Romero Belling.

## 2023-02-22 NOTE — Telephone Encounter (Signed)
Prescription Request  02/22/2023  LOV: 11/09/2022  What is the name of the medication or equipment?   atorvastatin (LIPITOR) 20 MG tablet  amLODipine (NORVASC) 5 MG table  glimepiride (AMARYL) 4 MG tablet  Have you contacted your pharmacy to request a refill? Yes   Which pharmacy would you like this sent to?  [id number] [place], [place] - [address]   Patient notified that their request is being sent to the clinical staff for review and that they should receive a response within 2 business days.   Please advise at Mobile [phone] (mobile)

## 2023-02-23 MED ORDER — GLIMEPIRIDE 1 MG PO TABS: ORAL_TABLET | ORAL | 1 refills | Status: DC

## 2023-02-23 NOTE — Telephone Encounter (Signed)
[facility] [place] [facility], did not [facility] 4 MG Rx, only had 2 MG. Informed Vonna Kotyk pt has been on 1 mg, 2 mg was discontinued, 1 MG Rx was sent over to be filled. Removed 4 MG from med list.

## 2023-02-23 NOTE — Addendum Note (Signed)
Addended by: Elwin Mocha on: 02/23/2023 09:51 AM   Modules accepted: Orders

## 2023-03-03 ENCOUNTER — Ambulatory Visit (INDEPENDENT_AMBULATORY_CARE_PROVIDER_SITE_OTHER): Payer: PPO | Admitting: Family Medicine

## 2023-03-03 VITALS — BP 124/68 | HR 67 | Temp 98.7°F | Wt 234.6 lb

## 2023-03-03 DIAGNOSIS — I2510 Atherosclerotic heart disease of native coronary artery without angina pectoris: Secondary | ICD-10-CM | POA: Diagnosis not present

## 2023-03-03 DIAGNOSIS — I10 Essential (primary) hypertension: Secondary | ICD-10-CM

## 2023-03-03 DIAGNOSIS — E1149 Type 2 diabetes mellitus with other diabetic neurological complication: Secondary | ICD-10-CM | POA: Diagnosis not present

## 2023-03-03 DIAGNOSIS — Z7984 Long term (current) use of oral hypoglycemic drugs: Secondary | ICD-10-CM | POA: Diagnosis not present

## 2023-03-03 DIAGNOSIS — E782 Mixed hyperlipidemia: Secondary | ICD-10-CM | POA: Diagnosis not present

## 2023-03-06 ENCOUNTER — Ambulatory Visit: Payer: PPO

## 2023-03-06 NOTE — Progress Notes (Signed)
  Care Management & Coordination Services Pharmacy Note  03/06/2023 Name:  John Hobbs MRN:  [id number] DOB:  09/30/1946                                         FOCUSED MED RECONCILIATION/DM OUTREACH Summary: -Patient brings in THREE different doses of glimepiride 1mg  bottle (taking 1/2 tab), 1mg  bottle taking full tab, and glimepiride 4mg  (not sure if he has been taking or not). -Also brings in Tradjenta 5mg  -states started by endo on 4/30 Had sugar high of 238 this morning prior to food and after morning meds -Current GMI predicted at 7.5% for last 90 days  Recommendations/Changes made from today's visit: -STOP Glimepiride 4mg  and take Glimepiride 1mg  1 full tab daily, as previously instructed by PCP -Kennedy Bucker access to [employer] account so office can monitor BG closely (pending granddaughter providing email address and accepting invite. -Updated medication list to reflect how patient is taking all of his meds in the chart  Follow up plan: 1 week to assess sugar control now that medication regimen has been documented to assess need for any changes   Subjective: John Hobbs is an 77 y.o. year old male who is a primary patient of Deeann Saint, MD.  The care coordination team was consulted for assistance with disease management and care coordination needs.    Presents in office with all of his medications, except his refrigerated Humalog mix pen, left at home.  See above for further appt details  Sherrill Raring Clinical Pharmacist [phone]

## 2023-03-10 ENCOUNTER — Telehealth: Payer: Self-pay

## 2023-03-10 NOTE — Progress Notes (Signed)
Care Management & Coordination Services Pharmacy Team  Reason for Encounter: Appointment Reminder  Contacted patient to confirm telephone appointment with Delano Metz, PharmD on 03/14/2023 at 10:00. Spoke with patient on 03/10/2023   Do you have any problems getting your medications? Patient denies  What is your top health concern you would like to discuss at your upcoming visit? Patient denies  Have you seen any other providers since your last visit with PCP?  Patient denies  Care Gaps: AWV - completed 06/09/2022 Last eye exam - 01/11/2022 Last foot exam - 02/17/2021 Last BP - 124/68 on 03/03/2023 Last A1C - 7.9 on 11/09/2022 Shingrix - never done Tdap - overdue Urine ACR - overdue Covid - overdue   Star Rating Drugs: Atorvastatin 20 mg  - last filled 11/20/2022 90 DS at [facility] verified Losartan 50 mg  - last filled 01/02/2022 90 DS at [facility] verified  Metformin 1000 mg  - last filled 12/06/2022 90 DS at [facility] verified  Glimepiride 1 mg - last filled [facility]/23/2023 [facility]  Inetta Fermo [facility]  Clinical Pharmacist Assistant [phone]

## 2023-03-10 NOTE — Progress Notes (Unsigned)
  Care Management & Coordination Services Pharmacy Note  03/10/2023 Name:  John Hobbs MRN:  [id number] DOB:  May 13, 1946                                         FOCUSED MED RECONCILIATION/DM OUTREACH Summary: -Patient brings in THREE different doses of glimepiride 1mg  bottle (taking 1/2 tab), 1mg  bottle taking full tab, and glimepiride 4mg  (not sure if he has been taking or not). -Also brings in Tradjenta 5mg  -states started by endo on 4/30 Had sugar high of 238 this morning prior to food and after morning meds -Current GMI predicted at 7.5% for last 90 days  Recommendations/Changes made from today's visit: -STOP Glimepiride 4mg  and take Glimepiride 1mg  1 full tab daily, as previously instructed by PCP -Kennedy Bucker access to [employer] account so office can monitor BG closely (pending granddaughter providing email address and accepting invite. -Updated medication list to reflect how patient is taking all of his meds in the chart  Follow up plan: 1 week to assess sugar control now that medication regimen has been documented to assess need for any changes   Subjective: John Hobbs is an 77 y.o. year old male who is a primary patient of Deeann Saint, MD.  The care coordination team was consulted for assistance with disease management and care coordination needs.    Presents in office with all of his medications, except his refrigerated Humalog mix pen, left at home.  See above for further appt details  Sherrill Raring Clinical Pharmacist [phone]

## 2023-03-14 ENCOUNTER — Telehealth: Payer: Self-pay | Admitting: Family Medicine

## 2023-03-14 DIAGNOSIS — I10 Essential (primary) hypertension: Secondary | ICD-10-CM

## 2023-03-14 NOTE — Telephone Encounter (Signed)
Prescription Request  03/14/2023  LOV: 03/03/2023  What is the name of the medication or equipment? isosorbide mononitrate (IMDUR) 30 MG 24 hr tablet potassium chloride SA (KLOR-CON M) 20 MEQ tablet losartan (COZAAR) 50 MG tablet  atenolol-chlorthalidone (TENORETIC) 100-25 MG tablet  Have you contacted your pharmacy to request a refill? No   Which pharmacy would you like this sent to?   [id number] [place], [place] - [address] AT [facility] [address] [place] [address] Phone: [phone] Fax: [phone]    Patient notified that their request is being sent to the clinical staff for review and that they should receive a response within 2 business days.   Please advise at Mobile [phone] (mobile)

## 2023-03-17 ENCOUNTER — Other Ambulatory Visit: Payer: Self-pay

## 2023-03-17 DIAGNOSIS — I10 Essential (primary) hypertension: Secondary | ICD-10-CM

## 2023-03-17 NOTE — Telephone Encounter (Signed)
ATC pt and wife twice and call was disconnected. Pt has refills at [facility] on [address] for requested medications, [facility] or [facility] and [facility].

## 2023-03-20 NOTE — Progress Notes (Unsigned)
  Care Management & Coordination Services Pharmacy Note  03/20/2023 Name:  John Hobbs MRN:  [id number] DOB:  1946/09/23                                         FOCUSED MED RECONCILIATION/DM OUTREACH Summary: -Patient brings in THREE different doses of glimepiride 1mg  bottle (taking 1/2 tab), 1mg  bottle taking full tab, and glimepiride 4mg  (not sure if he has been taking or not). -Also brings in Tradjenta 5mg  -states started by endo on 4/30 Had sugar high of 238 this morning prior to food and after morning meds -Current GMI predicted at 7.5% for last 90 days  Recommendations/Changes made from today's visit: -STOP Glimepiride 4mg  and take Glimepiride 1mg  1 full tab daily, as previously instructed by PCP -Kennedy Bucker access to [employer] account so office can monitor BG closely (pending granddaughter providing email address and accepting invite. -Updated medication list to reflect how patient is taking all of his meds in the chart  Follow up plan: 1 week to assess sugar control now that medication regimen has been documented to assess need for any changes   Subjective: John Hobbs is an 77 y.o. year old male who is a primary patient of Deeann Saint, MD.  The care coordination team was consulted for assistance with disease management and care coordination needs.    Presents in office with all of his medications, except his refrigerated Humalog mix pen, left at home.  See above for further appt details  Diabetes (A1c goal {A1c goals:23924}) -{US controlled/uncontrolled:25276} -Current medications: *** -Medications previously tried: ***  -Current home glucose readings fasting glucose: *** post prandial glucose: *** -{ACTIONS;DENIES/REPORTS:21021675::"Denies"} hypoglycemic/hyperglycemic symptoms -Current meal patterns:  breakfast: ***  lunch: ***  dinner: *** snacks: *** drinks: *** -Current exercise: *** -Educated on {CCM DM COUNSELING:25123} -Counseled to check feet  daily and get yearly eye exams -{CCMPHARMDINTERVENTION:25122}   Sherrill Raring Clinical Pharmacist [phone]

## 2023-03-21 ENCOUNTER — Telehealth: Payer: Self-pay | Admitting: Family Medicine

## 2023-03-21 ENCOUNTER — Encounter: Payer: Self-pay | Admitting: Family Medicine

## 2023-03-21 ENCOUNTER — Telehealth: Payer: Self-pay

## 2023-03-21 DIAGNOSIS — E1165 Type 2 diabetes mellitus with hyperglycemia: Secondary | ICD-10-CM
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
## 2023-03-21 NOTE — Progress Notes (Signed)
Care Management & Coordination Services Pharmacy Team  Reason for Encounter: Appointment Reminder  Contacted patient to confirm telephone appointment with Delano Metz, PharmD on 03/22/2023 at 2:30. Spoke with patient on 03/21/2023    Care Gaps: AWV - completed 06/09/2022 Last eye exam - 01/11/2022 Last foot exam - 02/17/2021 Last BP - 124/68 on 03/03/2023 Last A1C - 7.9 on 11/09/2022 Shingrix - never done Tdap - overdue Urine ACR - overdue Covid - overdue   Star Rating Drugs: Atorvastatin 20 mg  - [facility] Losartan 50 mg  - last filled 01/02/2022 90 DS at [facility] verified  Metformin 1000 mg  - last filled 12/06/2022 90 DS at [facility] verified  Glimepiride 1 mg - last filled [facility]/23/[facility]23 [facility]  Inetta Fermo [facility]  Clinical Pharmacist Assistant [phone]

## 2023-03-21 NOTE — Telephone Encounter (Signed)
Prescription Request  03/21/2023  LOV: 03/03/2023  What is the name of the medication or equipment?  metFORMIN (GLUCOPHAGE) 1000 MG tablet  Have you contacted your pharmacy to request a refill? Yes   Which pharmacy would you like this sent to?  [id number] - [place], [place] - [address] AT [facility] [address] [place] [address] Phone: [phone] Fax: [phone]    Patient notified that their request is being sent to the clinical staff for review and that they should receive a response within 2 business days.   Please advise at Mobile [phone] (mobile)

## 2023-03-22 ENCOUNTER — Ambulatory Visit: Payer: PPO

## 2023-03-23 MED ORDER — METFORMIN HCL 1000 MG PO TABS: ORAL_TABLET | ORAL | 1 refills | Status: DC

## 2023-03-23 NOTE — Addendum Note (Signed)
Addended by: Elwin Mocha on: 03/23/2023 09:49 AM   Modules accepted: Orders

## 2023-03-23 NOTE — Telephone Encounter (Signed)
Medication sent to requested pharmacy.

## 2023-03-24 ENCOUNTER — Telehealth: Payer: Self-pay | Admitting: Family Medicine

## 2023-03-24 NOTE — Telephone Encounter (Addendum)
Please disregard

## 2023-03-24 NOTE — Telephone Encounter (Signed)
Spouse called to say this Rx was sent to the wrong Pharmacy -  Please resend Rx to:   [id number] [place], [place] - [address] AT [facility] [address] [phone] [address] [place] [address]   Labs

## 2023-03-29 ENCOUNTER — Ambulatory Visit: Payer: PPO

## 2023-03-29 NOTE — Progress Notes (Signed)
  Care Management & Coordination Services Pharmacy Note  03/29/2023 Name:  John Hobbs MRN:  [id number] DOB:  April 04, 1946                                         FOCUSED MED RECONCILIATION/DM OUTREACH Summary: -Pt taking glimepiride 1mg  and tradjenta 5mg  in am, metformin 1000mg  BID, and has not given himself insulin in at least 2-3 weeks.  -GMI predicted A1C is showing 7.5, with highs above 250 in the morning time  Recommendations/Changes made from today's visit: -INCREASE Glimepiride to 2mg  once daily in the morning, PCP approved -Recommended patient closely follow with endo and schedule f/u appt sooner if needed  Follow up plan: 4 week f/u scheduled with PCP. Patient instructed to call sooner if any lows or other concerns   Subjective: John Hobbs is an 77 y.o. year old male who is a primary patient of Deeann Saint, MD.  The care coordination team was consulted for assistance with disease management and care coordination needs.    Following up for diabetes concerns.  See above for further appt details  Sherrill Raring Clinical Pharmacist [phone]

## 2023-04-19 ENCOUNTER — Other Ambulatory Visit: Payer: Self-pay | Admitting: Family Medicine

## 2023-04-26 ENCOUNTER — Encounter: Payer: Self-pay | Admitting: Family Medicine

## 2023-04-26 DIAGNOSIS — H40013 Open angle with borderline findings, low risk, bilateral: Secondary | ICD-10-CM | POA: Diagnosis not present

## 2023-04-26 DIAGNOSIS — Z961 Presence of intraocular lens: Secondary | ICD-10-CM | POA: Diagnosis not present

## 2023-04-26 DIAGNOSIS — E11319 Type 2 diabetes mellitus with unspecified diabetic retinopathy without macular edema: Secondary | ICD-10-CM | POA: Insufficient documentation

## 2023-04-26 DIAGNOSIS — E113293 Type 2 diabetes mellitus with mild nonproliferative diabetic retinopathy without macular edema, bilateral: Secondary | ICD-10-CM | POA: Diagnosis not present

## 2023-04-26 LAB — HM DIABETES EYE EXAM

## 2023-05-03 ENCOUNTER — Ambulatory Visit (INDEPENDENT_AMBULATORY_CARE_PROVIDER_SITE_OTHER): Payer: PPO | Admitting: Family Medicine

## 2023-05-03 VITALS — BP 132/72 | HR 65 | Temp 98.4°F | Wt 231.4 lb

## 2023-05-03 DIAGNOSIS — R413 Other amnesia: Secondary | ICD-10-CM | POA: Diagnosis not present

## 2023-05-03 DIAGNOSIS — Z7984 Long term (current) use of oral hypoglycemic drugs: Secondary | ICD-10-CM | POA: Diagnosis not present

## 2023-05-03 DIAGNOSIS — E1165 Type 2 diabetes mellitus with hyperglycemia: Secondary | ICD-10-CM

## 2023-05-03 DIAGNOSIS — Z794 Long term (current) use of insulin: Secondary | ICD-10-CM

## 2023-05-03 DIAGNOSIS — E1149 Type 2 diabetes mellitus with other diabetic neurological complication: Secondary | ICD-10-CM | POA: Diagnosis not present

## 2023-05-03 LAB — COMPREHENSIVE METABOLIC PANEL
ALT: 14 U/L (ref 0–53)
AST: 19 U/L (ref 0–37)
Albumin: 4.3 g/dL (ref 3.5–5.2)
Alkaline Phosphatase: 32 U/L — ABNORMAL LOW (ref 39–117)
BUN: 15 mg/dL (ref 6–23)
CO2: 23 mEq/L (ref 19–32)
Calcium: 9.6 mg/dL (ref 8.4–10.5)
Chloride: 104 mEq/L (ref 96–112)
Creatinine, Ser: 1.11 mg/dL (ref 0.40–1.50)
GFR: 65.37 mL/min (ref >60.00)
Glucose, Bld: 147 mg/dL — ABNORMAL HIGH (ref 70–99)
Potassium: 3.7 mEq/L (ref 3.5–5.1)
Sodium: 138 mEq/L (ref 135–145)
Total Bilirubin: 0.6 mg/dL (ref 0.2–1.2)
Total Protein: 7.5 g/dL (ref 6.0–8.3)

## 2023-05-03 LAB — CBC WITH DIFFERENTIAL/PLATELET
Basophils Absolute: 0.1 10*3/uL (ref 0.0–0.1)
Basophils Relative: 0.8 % (ref 0.0–3.0)
Eosinophils Absolute: 0.2 10*3/uL (ref 0.0–0.7)
Eosinophils Relative: 3 % (ref 0.0–5.0)
HCT: 42 % (ref 39.0–52.0)
Hemoglobin: 13.4 g/dL (ref 13.0–17.0)
Lymphocytes Relative: 36.9 % (ref 12.0–46.0)
Lymphs Abs: 2.5 10*3/uL (ref 0.7–4.0)
MCHC: 31.9 g/dL (ref 30.0–36.0)
MCV: 87 fl (ref 78.0–100.0)
Monocytes Absolute: 0.7 10*3/uL (ref 0.1–1.0)
Monocytes Relative: 10.1 % (ref 3.0–12.0)
Neutro Abs: 3.3 10*3/uL (ref 1.4–7.7)
Neutrophils Relative %: 49.2 % (ref 43.0–77.0)
Platelets: 252 10*3/uL (ref 150.0–400.0)
RBC: 4.83 Mil/uL (ref 4.22–5.81)
RDW: 15 % (ref 11.5–15.5)
WBC: 6.8 10*3/uL (ref 4.0–10.5)

## 2023-05-03 LAB — HEMOGLOBIN A1C: Hgb A1c MFr Bld: 8.8 % — ABNORMAL HIGH (ref 4.6–6.5)

## 2023-05-03 LAB — TSH: TSH: 2.37 u[IU]/mL (ref 0.35–5.50)

## 2023-05-03 LAB — MICROALBUMIN / CREATININE URINE RATIO
Creatinine,U: 124.7 mg/dL
Microalb Creat Ratio: 0.6 mg/g (ref 0.0–30.0)
Microalb, Ur: <0.7 mg/dL (ref 0.0–1.9)

## 2023-05-03 LAB — T4, FREE: Free T4: 0.6 ng/dL (ref 0.60–1.60)

## 2023-05-03 LAB — VITAMIN B12: Vitamin B-12: 220 pg/mL (ref 211–911)

## 2023-05-03 LAB — FOLATE: Folate: 9.6 ng/mL (ref >5.9)

## 2023-05-03 MED ORDER — METFORMIN HCL 1000 MG PO TABS: ORAL_TABLET | ORAL | 1 refills | Status: DC

## 2023-05-03 MED ORDER — GLIMEPIRIDE 2 MG PO TABS: 2.0000 mg | ORAL_TABLET | Freq: Every day | ORAL | 1 refills | Status: DC

## 2023-05-03 NOTE — Patient Instructions (Addendum)
You should be taking metformin 1000 mg 1 tab twice a day and glimepiride 2 mg in the morning with breakfast.  A new prescription for the increased dose of glimepiride along with a refill on metformin was sent to your pharmacy.  Labs were ordered to check on your memory.  A referral to psychology for neuropsych testing to further evaluate your memory was also placed.  They will call you about setting up the appointment.  I have included some information about mild neurocognitive disorders for you to review.
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
## 2023-05-10 ENCOUNTER — Encounter: Payer: Self-pay | Admitting: Family Medicine

## 2023-05-17 ENCOUNTER — Encounter (INDEPENDENT_AMBULATORY_CARE_PROVIDER_SITE_OTHER): Payer: Self-pay

## 2023-05-19 ENCOUNTER — Telehealth: Payer: Self-pay | Admitting: Family Medicine

## 2023-05-19 NOTE — Telephone Encounter (Signed)
Spouse called to request "BD Nano Second Gen 10 needles  4 BD Nano 4 MM x 32 GMM" 100 in a box  Please send as soon as possible to  [id number] - [place], [place] - [address] AT [facility] [address] Phone: [phone]  Fax: [phone]

## 2023-05-22 MED ORDER — BD PEN NEEDLE NANO U/F 32G X 4 MM MISC: 3 refills | Status: AC

## 2023-05-22 NOTE — Telephone Encounter (Signed)
Refill sent to pharmacy.   

## 2023-06-01 ENCOUNTER — Other Ambulatory Visit: Payer: Self-pay | Admitting: Family Medicine

## 2023-06-01 DIAGNOSIS — E1149 Type 2 diabetes mellitus with other diabetic neurological complication: Secondary | ICD-10-CM

## 2023-06-01 NOTE — Telephone Encounter (Signed)
Freestyle Libre monitor and ONETOUCH VERIO test strip    [id number] - , [place] - [address] AT [facility] [address] Phone: [phone]  Fax: [phone]

## 2023-06-01 NOTE — Addendum Note (Signed)
Addended by: Philipp Deputy A on: 06/01/2023 01:58 PM   Modules accepted: Orders

## 2023-06-05 ENCOUNTER — Other Ambulatory Visit: Payer: Self-pay | Admitting: Family Medicine

## 2023-06-05 DIAGNOSIS — E1140 Type 2 diabetes mellitus with diabetic neuropathy, unspecified: Secondary | ICD-10-CM

## 2023-06-05 MED ORDER — FREESTYLE LIBRE 3 SENSOR MISC: 11 refills | Status: DC

## 2023-06-05 MED ORDER — ONETOUCH VERIO VI STRP: ORAL_STRIP | 3 refills | Status: AC

## 2023-06-08 ENCOUNTER — Encounter (INDEPENDENT_AMBULATORY_CARE_PROVIDER_SITE_OTHER): Payer: PPO | Admitting: Family Medicine

## 2023-06-08 NOTE — Progress Notes (Signed)
Per nursing staff, pateint unable to do appt today and requested to reschedule.

## 2023-06-11 ENCOUNTER — Other Ambulatory Visit: Payer: Self-pay | Admitting: Family Medicine

## 2023-06-11 DIAGNOSIS — E1149 Type 2 diabetes mellitus with other diabetic neurological complication: Secondary | ICD-10-CM

## 2023-06-20 DIAGNOSIS — E876 Hypokalemia: Secondary | ICD-10-CM | POA: Diagnosis not present

## 2023-06-20 DIAGNOSIS — E1142 Type 2 diabetes mellitus with diabetic polyneuropathy: Secondary | ICD-10-CM | POA: Diagnosis not present

## 2023-06-20 DIAGNOSIS — E1165 Type 2 diabetes mellitus with hyperglycemia: Secondary | ICD-10-CM | POA: Diagnosis not present

## 2023-06-20 DIAGNOSIS — E039 Hypothyroidism, unspecified: Secondary | ICD-10-CM | POA: Diagnosis not present

## 2023-06-20 DIAGNOSIS — Z794 Long term (current) use of insulin: Secondary | ICD-10-CM | POA: Diagnosis not present

## 2023-06-20 DIAGNOSIS — E1136 Type 2 diabetes mellitus with diabetic cataract: Secondary | ICD-10-CM | POA: Diagnosis not present

## 2023-06-20 DIAGNOSIS — E785 Hyperlipidemia, unspecified: Secondary | ICD-10-CM | POA: Diagnosis not present

## 2023-06-20 DIAGNOSIS — I779 Disorder of arteries and arterioles, unspecified: Secondary | ICD-10-CM | POA: Diagnosis not present

## 2023-06-20 DIAGNOSIS — I700 Atherosclerosis of aorta: Secondary | ICD-10-CM | POA: Diagnosis not present

## 2023-06-20 DIAGNOSIS — E669 Obesity, unspecified: Secondary | ICD-10-CM | POA: Diagnosis not present

## 2023-06-20 DIAGNOSIS — I2510 Atherosclerotic heart disease of native coronary artery without angina pectoris: Secondary | ICD-10-CM | POA: Diagnosis not present

## 2023-06-20 DIAGNOSIS — I10 Essential (primary) hypertension: Secondary | ICD-10-CM | POA: Diagnosis not present

## 2023-07-12 ENCOUNTER — Ambulatory Visit (HOSPITAL_COMMUNITY): Payer: Self-pay | Admitting: Family

## 2023-07-14 ENCOUNTER — Other Ambulatory Visit: Payer: Self-pay | Admitting: Cardiovascular Disease

## 2023-07-14 ENCOUNTER — Other Ambulatory Visit: Payer: Self-pay | Admitting: Family Medicine

## 2023-07-14 DIAGNOSIS — I10 Essential (primary) hypertension: Secondary | ICD-10-CM

## 2023-07-14 DIAGNOSIS — E1149 Type 2 diabetes mellitus with other diabetic neurological complication: Secondary | ICD-10-CM

## 2023-07-18 ENCOUNTER — Ambulatory Visit (INDEPENDENT_AMBULATORY_CARE_PROVIDER_SITE_OTHER): Payer: PPO | Admitting: Family Medicine

## 2023-07-18 ENCOUNTER — Encounter: Payer: Self-pay | Admitting: Family Medicine

## 2023-07-18 VITALS — Ht 70.0 in | Wt 230.0 lb

## 2023-07-18 DIAGNOSIS — Z0000 Encounter for general adult medical examination without abnormal findings: Secondary | ICD-10-CM | POA: Diagnosis not present
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
## 2023-07-18 NOTE — Progress Notes (Signed)
 Per patient no change in vitals since last visit, unable to obtain new vitals due to telehealth visit

## 2023-07-18 NOTE — Patient Instructions (Signed)
I really enjoyed getting to talk with you today! I am available on Tuesdays and Thursdays for virtual visits if you have any questions or concerns, or if I can be of any further assistance.   CHECKLIST FROM ANNUAL WELLNESS VISIT:  -Follow up (please call to schedule if not scheduled after visit):   -yearly for annual wellness visit with primary care office  Here is a list of your preventive care/health maintenance measures and the plan for each if any are due:  PLAN For any measures below that may be due:   Health Maintenance  Topic Date Due   DTaP/Tdap/Td (2 - Td or Tdap) 08/28/2021   FOOT EXAM  02/17/2022   Zoster Vaccines- Shingrix (1 of 2) 10/18/2023 (Originally 09/25/1998)   INFLUENZA VACCINE  01/15/2024 (Originally 05/18/2023)   COVID-19 Vaccine (5 - 2023-24 season) 10/21/2023   LIPID PANEL  10/26/2023   HEMOGLOBIN A1C  11/03/2023   OPHTHALMOLOGY EXAM  04/25/2024   Diabetic kidney evaluation - eGFR measurement  05/02/2024   Diabetic kidney evaluation - Urine ACR  05/02/2024   Medicare Annual Wellness (AWV)  07/17/2024   Colonoscopy  02/23/2026   Pneumonia Vaccine 36+ Years old  Completed   Hepatitis C Screening  Addressed   HPV VACCINES  Aged Out    -See a dentist at least yearly  -Get your eyes checked and then per your eye specialist's recommendations  -Other issues addressed today:   -I have included below further information regarding a healthy whole foods based diet, physical activity guidelines for adults, stress management and opportunities for social connections. I hope you find this information useful.   -----------------------------------------------------------------------------------------------------------------------------------------------------------------------------------------------------------------------------------------------------------  NUTRITION: -eat real food: lots of colorful vegetables (half the plate) and fruits -5-7 servings of  vegetables and fruits per day (fresh or steamed is best), exp. 2 servings of vegetables with lunch and dinner and 2 servings of fruit per day. Berries and greens such as kale and collards are great choices.  -consume on a regular basis: whole grains (make sure first ingredient on label contains the word "whole"), fresh fruits, fish, nuts, seeds, healthy oils (such as olive oil, avocado oil, grape seed oil) -may eat small amounts of dairy and lean meat on occasion, but avoid processed meats such as ham, bacon, lunch meat, etc. -drink water -try to avoid fast food and pre-packaged foods, processed meat -most experts advise limiting sodium to < 2300mg  per day, should limit further is any chronic conditions such as high blood pressure, heart disease, diabetes, etc. The American Heart Association advised that < 1500mg  is is ideal -try to avoid foods that contain any ingredients with names you do not recognize  -try to avoid sugar/sweets (except for the natural sugar that occurs in fresh fruit) -try to avoid sweet drinks -try to avoid white rice, white bread, pasta (unless whole grain), white or yellow potatoes  EXERCISE GUIDELINES FOR ADULTS: -if you wish to increase your physical activity, do so gradually and with the approval of your doctor -STOP and seek medical care immediately if you have any chest pain, chest discomfort or trouble breathing when starting or increasing exercise  -move and stretch your body, legs, feet and arms when sitting for long periods -Physical activity guidelines for optimal health in adults: -least 150 minutes per week of aerobic exercise (can talk, but not sing) once approved by your doctor, 20-30 minutes of sustained activity or two 10 minute episodes of sustained activity every day.  -resistance training at least 2 days  per week if approved by your doctor -balance exercises 3+ days per week:   Stand somewhere where you have something sturdy to hold onto if you lose  balance.    1) lift up on toes, start with 5x per day and work up to 20x   2) stand and lift on leg straight out to the side so that foot is a few inches of the floor, start with 5x each side and work up to 20x each side   3) stand on one foot, start with 5 seconds each side and work up to 20 seconds on each side  If you need ideas or help with getting more active:  -Silver sneakers https://tools.silversneakers.com  -Walk with a Doc: http://www.duncan-williams.com/  -try to include resistance (weight lifting/strength building) and balance exercises twice per week: or the following link for ideas: http://castillo-powell.com/  BuyDucts.dk  STRESS MANAGEMENT: -can try meditating, or just sitting quietly with deep breathing while intentionally relaxing all parts of your body for 5 minutes daily -if you need further help with stress, anxiety or depression please follow up with your primary doctor or contact the wonderful folks at [facility]: [phone]  SOCIAL CONNECTIONS: -options in [place] if you wish to engage in more social and exercise related activities:  -Silver sneakers https://tools.silversneakers.com  -Walk with a Doc: http://www.duncan-williams.com/  -Check out the [facility] Active Adults 50+ section on the [place] [employer] (hiking clubs, book clubs, cards and games, chess, exercise classes, aquatic classes and much more) - see the website for details: https://www.-[place].gov/departments/parks-recreation/active-adults50  -YouTube has lots of exercise videos for different ages and abilities as well  -[facility] (a variety of indoor and outdoor inperson activities for adults). 816-461-2591. [address]  -Virtual Online Classes (a variety of topics): see seniorplanet.org or call [phone]  -consider volunteering at a school, hospice  center, church, senior center or elsewhere         ADVANCED HEALTHCARE DIRECTIVES:  [place] Advanced Directives assistance:   ExpressWeek.com.cy  Everyone should have advanced health care directives in place. This is so that you get the care you want, should you ever be in a situation where you are unable to make your own medical decisions.   From the [place] Advanced Directive Website: "Advance Health Care Directives are legal documents in which you give written instructions about your health care if, in the future, you cannot speak for yourself.   A health care power of attorney allows you to name a person you trust to make your health care decisions if you cannot make them yourself. A declaration of a desire for a natural death (or living will) is document, which states that you desire not to have your life prolonged by extraordinary measures if you have a terminal or incurable illness or if you are in a vegetative state. An advance instruction for mental health treatment makes a declaration of instructions, information and preferences regarding your mental health treatment. It also states that you are aware that the advance instruction authorizes a mental health treatment provider to act according to your wishes. It may also outline your consent or refusal of mental health treatment. A declaration of an anatomical gift allows anyone over the age of 68 to make a gift by will, organ donor card or other document."   Please see the following website or an elder law attorney for forms, FAQs and for completion of advanced directives: [place] TEFL teacher Health Care Directives Advance Health  Care Directives (http://guzman.[email or web address]/)  Or copy and paste the following to your web browser: PoshChat.fi

## 2023-08-02 DIAGNOSIS — E1165 Type 2 diabetes mellitus with hyperglycemia: Secondary | ICD-10-CM | POA: Diagnosis not present

## 2023-08-02 DIAGNOSIS — E7800 Pure hypercholesterolemia, unspecified: Secondary | ICD-10-CM | POA: Diagnosis not present

## 2023-08-03 ENCOUNTER — Ambulatory Visit: Payer: PPO | Admitting: Family Medicine

## 2023-08-03 ENCOUNTER — Encounter: Payer: Self-pay | Admitting: Family Medicine

## 2023-08-03 VITALS — BP 122/74 | HR 74 | Temp 97.9°F | Ht 70.0 in | Wt 229.6 lb

## 2023-08-03 DIAGNOSIS — Z794 Long term (current) use of insulin: Secondary | ICD-10-CM

## 2023-08-03 DIAGNOSIS — H9193 Unspecified hearing loss, bilateral: Secondary | ICD-10-CM

## 2023-08-03 DIAGNOSIS — I10 Essential (primary) hypertension: Secondary | ICD-10-CM

## 2023-08-03 DIAGNOSIS — E1140 Type 2 diabetes mellitus with diabetic neuropathy, unspecified: Secondary | ICD-10-CM
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
## 2023-08-09 DIAGNOSIS — R21 Rash and other nonspecific skin eruption: Secondary | ICD-10-CM | POA: Diagnosis not present

## 2023-08-09 DIAGNOSIS — E7800 Pure hypercholesterolemia, unspecified: Secondary | ICD-10-CM | POA: Diagnosis not present

## 2023-08-09 DIAGNOSIS — E162 Hypoglycemia, unspecified: Secondary | ICD-10-CM | POA: Diagnosis not present

## 2023-08-09 DIAGNOSIS — E1165 Type 2 diabetes mellitus with hyperglycemia: Secondary | ICD-10-CM | POA: Diagnosis not present

## 2023-08-09 DIAGNOSIS — I10 Essential (primary) hypertension: Secondary | ICD-10-CM | POA: Diagnosis not present

## 2023-08-10 ENCOUNTER — Other Ambulatory Visit: Payer: Self-pay | Admitting: Family Medicine

## 2023-08-25 ENCOUNTER — Other Ambulatory Visit: Payer: Self-pay | Admitting: Family Medicine

## 2023-08-25 DIAGNOSIS — E1149 Type 2 diabetes mellitus with other diabetic neurological complication: Secondary | ICD-10-CM

## 2023-09-07 ENCOUNTER — Emergency Department (HOSPITAL_COMMUNITY)
Admission: EM | Admit: 2023-09-07 | Discharge: 2023-09-08 | Disposition: A | Discharge status: Home / Self Care | Payer: PPO | Attending: Emergency Medicine | Admitting: Emergency Medicine

## 2023-09-07 ENCOUNTER — Encounter (HOSPITAL_COMMUNITY): Payer: Self-pay | Admitting: Emergency Medicine

## 2023-09-07 ENCOUNTER — Other Ambulatory Visit: Payer: Self-pay

## 2023-09-07 ENCOUNTER — Emergency Department (HOSPITAL_COMMUNITY): Payer: PPO

## 2023-09-07 DIAGNOSIS — M47812 Spondylosis without myelopathy or radiculopathy, cervical region: Secondary | ICD-10-CM | POA: Diagnosis not present

## 2023-09-07 DIAGNOSIS — I10 Essential (primary) hypertension: Secondary | ICD-10-CM | POA: Diagnosis not present

## 2023-09-07 DIAGNOSIS — Z7984 Long term (current) use of oral hypoglycemic drugs: Secondary | ICD-10-CM | POA: Diagnosis not present

## 2023-09-07 DIAGNOSIS — M50221 Other cervical disc displacement at C4-C5 level: Secondary | ICD-10-CM | POA: Diagnosis not present

## 2023-09-07 DIAGNOSIS — M5412 Radiculopathy, cervical region: Secondary | ICD-10-CM

## 2023-09-07 DIAGNOSIS — I6782 Cerebral ischemia: Secondary | ICD-10-CM | POA: Diagnosis not present

## 2023-09-07 DIAGNOSIS — Z794 Long term (current) use of insulin: Secondary | ICD-10-CM | POA: Diagnosis not present

## 2023-09-07 DIAGNOSIS — R42 Dizziness and giddiness: Secondary | ICD-10-CM | POA: Insufficient documentation

## 2023-09-07 DIAGNOSIS — M5021 Other cervical disc displacement,  high cervical region: Secondary | ICD-10-CM | POA: Diagnosis not present

## 2023-09-07 DIAGNOSIS — R202 Paresthesia of skin: Secondary | ICD-10-CM | POA: Diagnosis not present

## 2023-09-07 DIAGNOSIS — Z7982 Long term (current) use of aspirin: Secondary | ICD-10-CM | POA: Insufficient documentation

## 2023-09-07 DIAGNOSIS — M542 Cervicalgia: Secondary | ICD-10-CM | POA: Diagnosis not present

## 2023-09-07 DIAGNOSIS — M4802 Spinal stenosis, cervical region: Secondary | ICD-10-CM | POA: Diagnosis not present

## 2023-09-07 DIAGNOSIS — Z79899 Other long term (current) drug therapy: Secondary | ICD-10-CM | POA: Diagnosis not present

## 2023-09-07 DIAGNOSIS — E119 Type 2 diabetes mellitus without complications: Secondary | ICD-10-CM | POA: Insufficient documentation

## 2023-09-07 LAB — BASIC METABOLIC PANEL
Anion gap: 11 (ref 5–15)
BUN: 20 mg/dL (ref 8–23)
CO2: 25 mmol/L (ref 22–32)
Calcium: 10.1 mg/dL (ref 8.9–10.3)
Chloride: 101 mmol/L (ref 98–111)
Creatinine, Ser: 1 mg/dL (ref 0.61–1.24)
GFR, Estimated: >60 mL/min (ref >60)
Glucose, Bld: 154 mg/dL — ABNORMAL HIGH (ref 70–99)
Potassium: 3.3 mmol/L — ABNORMAL LOW (ref 3.5–5.1)
Sodium: 137 mmol/L (ref 135–145)

## 2023-09-07 LAB — CBC
HCT: 43.6 % (ref 39.0–52.0)
Hemoglobin: 14.6 g/dL (ref 13.0–17.0)
MCH: 28.7 pg (ref 26.0–34.0)
MCHC: 33.5 g/dL (ref 30.0–36.0)
MCV: 85.8 fL (ref 80.0–100.0)
Platelets: 272 10*3/uL (ref 150–400)
RBC: 5.08 MIL/uL (ref 4.22–5.81)
RDW: 14 % (ref 11.5–15.5)
WBC: 8.4 10*3/uL (ref 4.0–10.5)
nRBC: 0 % (ref 0.0–0.2)

## 2023-09-07 LAB — URINALYSIS, ROUTINE W REFLEX MICROSCOPIC
Bilirubin Urine: NEGATIVE
Glucose, UA: >=500 mg/dL — AB
Hgb urine dipstick: NEGATIVE
Ketones, ur: NEGATIVE mg/dL
Nitrite: NEGATIVE
Protein, ur: NEGATIVE mg/dL
Specific Gravity, Urine: 1.014 (ref 1.005–1.030)
pH: 5 (ref 5.0–8.0)

## 2023-09-07 LAB — TROPONIN I (HIGH SENSITIVITY): Troponin I (High Sensitivity): 3 ng/L (ref <18)

## 2023-09-07 MED ORDER — MECLIZINE HCL 25 MG PO TABS
25.0000 mg | ORAL_TABLET | Freq: Once | ORAL | Status: AC
  Administered 2023-09-07: 25 mg via ORAL
  Filled 2023-09-07: qty 1

## 2023-09-07 MED ORDER — SODIUM CHLORIDE 0.9 % IV BOLUS
1000.0000 mL | Freq: Once | INTRAVENOUS | Status: AC
  Administered 2023-09-07: 1000 mL via INTRAVENOUS

## 2023-09-07 NOTE — ED Provider Notes (Signed)
Blood pressure 122/82, pulse 75, temperature 97.9 F (36.6 C), temperature source Oral, resp. rate 18, height 5\' 10"  (1.778 m), weight 104.3 kg, SpO2 96%.  Assuming care from Dr. Silverio Lay.  In short, John Hobbs is a 77 y.o. male with a chief complaint of Dizziness and Neck Pain .  Refer to the original H&P for additional details.  The current plan of care is to follow up on MRI.  12:27 AM MRI brain and c spine without acute findings. Plan for d/c with PCP follow up.    Maia Plan, MD 09/08/23 [phone]

## 2023-09-07 NOTE — ED Provider Notes (Signed)
[place] EMERGENCY DEPARTMENT AT [facility] Provider Note   CSN: [id number] Arrival date & time: 09/07/23  1945     History  Chief Complaint  Patient presents with   Dizziness   Neck Pain    John Hobbs is a 77 y.o. male history of diabetes, hypertension who presented with dizziness and neck pain.  Patient states that he has dizziness for the last 2 to 3 weeks.  He states that he woke up today and the dizziness got much worse.  He states that he felt that the room was spinning.  Patient states that this afternoon he had some numbness and tingling of the left arm.  Denies any chest pain or shortness of breath.  Patient states that he has a history of vertigo previously but denies any history of stroke.  His last MRI was 2020.  The history is provided by the patient.       Home Medications Prior to Admission medications   Medication Sig Start Date End Date Taking? Authorizing Provider  acetaminophen (TYLENOL) 500 MG tablet Take 1,000 mg by mouth daily.    [provider]  amLODipine (NORVASC) 5 MG tablet TAKE 1 TABLET (5 MG TOTAL) BY MOUTH DAILY. 08/11/23   Deeann Saint, MD  aspirin EC 81 MG tablet Take 81 mg by mouth daily after breakfast.    [provider]  atenolol-chlorthalidone (TENORETIC) 100-25 MG tablet TAKE 1/2 TABLET BY MOUTH EVRYDAY 07/14/23   Deeann Saint, MD  atorvastatin (LIPITOR) 20 MG tablet TAKE 1 TABLET BY MOUTH EVERY DAY 07/14/23   Deeann Saint, MD  Continuous Glucose Sensor (FREESTYLE LIBRE 3 SENSOR) MISC USE TO CHECK GLUCOSE CONTINUOUSLY, CHANGE EVERY 14 DAYS AS DIRECTED 06/05/23   Deeann Saint, MD  fexofenadine (ALLEGRA) 180 MG tablet Take 1 tablet (180 mg total) by mouth daily. 10/08/21   Deeann Saint, MD  glimepiride (AMARYL) 1 MG tablet TAKE 1 TABLET(1 MG) BY MOUTH DAILY WITH SUPPER 08/11/23   Deeann Saint, MD  glimepiride (AMARYL) 2 MG tablet TAKE 1 TABLET BY MOUTH DAILY WITH BREAKFAST 08/30/23    Deeann Saint, MD  glucose blood (ONETOUCH VERIO) test strip Use as instructed 06/05/23   Deeann Saint, MD  glucose blood test strip SMARTSIG:Via Meter As Directed 06/04/23   [provider]  glucose blood test strip  06/04/23   [provider]  Insulin Lispro Prot & Lispro (HUMALOG MIX 75/25 KWIKPEN) (75-25) 100 UNIT/ML Kwikpen Inject 8 Units into the skin daily.    [provider]  Insulin Lispro Prot & Lispro (HUMALOG MIX 75/25 KWIKPEN) (75-25) 100 UNIT/ML Kwikpen 12u Subcutaneous once a day am 08/15/22   [provider]  Insulin Pen Needle (BD PEN NEEDLE NANO U/F) 32G X 4 MM MISC Use with insulin pen up to 2 times daily. 05/22/23   Deeann Saint, MD  isosorbide mononitrate (IMDUR) 30 MG 24 hr tablet TAKE ONE HALF TABLET BY MOUTH DAILY 07/14/23   Kathleene Hazel, MD  Lancets [facility] ULTRASOFT) lancets Use to check blood sugar once daily 01/22/20   Deeann Saint, MD  linagliptin (TRADJENTA) 5 MG TABS tablet Take 5 mg by mouth daily.    [provider]  losartan (COZAAR) 50 MG tablet TAKE 1 TABLET(50 MG) BY MOUTH DAILY 12/23/22   Deeann Saint, MD  metFORMIN (GLUCOPHAGE) 1000 MG tablet TAKE 1 TABLET(1000 MG) BY MOUTH TWICE DAILY WITH A MEAL 05/03/23  Deeann Saint, MD  ONE TOUCH LANCETS MISC Test once day 03/26/14   Terressa Koyanagi, DO  Potassium Chloride ER 20 MEQ TBCR TAKE 1/2 TABLET BY MOUTH DAILY 08/30/23   Deeann Saint, MD  potassium chloride SA (KLOR-CON M) 20 MEQ tablet TAKE 1/2 TABLET( 10 MEQ) BY MOUTH DAILY 07/18/22   Deeann Saint, MD      Allergies    Lisinopril    Review of Systems   Review of Systems  Musculoskeletal:  Positive for neck pain.  Neurological:  Positive for dizziness.  All other systems reviewed and are negative.   Physical Exam Updated Vital Signs BP (!) 129/97   Pulse 74   Temp 97.6 F (36.4 C)   Resp 18   Ht 5\' 10"  (1.778 m)   Wt 104.3 kg   SpO2 98%   BMI 33.00 kg/m  Physical  Exam Vitals and nursing note reviewed.  Constitutional:      Appearance: Normal appearance.  HENT:     Head: Normocephalic.     Nose: Nose normal.     Mouth/Throat:     Mouth: Mucous membranes are moist.  Eyes:     Pupils: Pupils are equal, round, and reactive to light.     Comments: Questionable nystagmus to the left  Cardiovascular:     Rate and Rhythm: Normal rate and regular rhythm.     Pulses: Normal pulses.     Heart sounds: Normal heart sounds.  Pulmonary:     Effort: Pulmonary effort is normal.     Breath sounds: Normal breath sounds.  Abdominal:     General: Abdomen is flat.     Palpations: Abdomen is soft.  Musculoskeletal:        General: Normal range of motion.     Cervical back: Normal range of motion and neck supple.  Skin:    General: Skin is warm.     Capillary Refill: Capillary refill takes less than 2 seconds.  Neurological:     General: No focal deficit present.     Mental Status: He is alert and oriented to person, place, and time.     Comments: Cranial nerve II to XII intact.  Patient has normal finger-to-nose bilaterally.  Slightly wide-based gait   Psychiatric:        Mood and Affect: Mood normal.        Behavior: Behavior normal.     ED Results / Procedures / Treatments   Labs (all labs ordered are listed, but only abnormal results are displayed) Labs Reviewed  BASIC METABOLIC PANEL - Abnormal; Notable for the following components:      Result Value   Potassium 3.3 (*)    Glucose, Bld 154 (*)    All other components within normal limits  CBC  URINALYSIS, ROUTINE W REFLEX MICROSCOPIC  TROPONIN I (HIGH SENSITIVITY)    EKG EKG Interpretation Date/Time:  Thursday September 07 2023 20:00:11 EST Ventricular Rate:  70 PR Interval:  218 QRS Duration:  97 QT Interval:  379 QTC Calculation: 409 R Axis:   -71  Text Interpretation: Sinus rhythm Borderline prolonged PR interval Abnormal R-wave progression, late transition Inferior infarct, old  No significant change since last tracing Confirmed by Richardean Canal [phone]) on 09/07/2023 9:12:13 PM  Radiology No results found.  Procedures Procedures    Medications Ordered in ED Medications  meclizine (ANTIVERT) tablet 25 mg (has no administration in time range)  sodium chloride 0.9 % bolus 1,000 mL (  has no administration in time range)    ED Course/ Medical Decision Making/ A&P                                 Medical Decision Making John Hobbs is a 77 y.o. male here presenting with vertigo.  He has 2 to 3 weeks of dizziness and worsening vertigo today.  Concern for peripheral vertigo versus stroke.  He also had some left arm numbness so I wonder if he has cervical radiculopathy.  Plan to get MRI brain and MR cervical spine.  Will get basic blood work as well.  9:50 PM Reviewed patient's labs and they were unremarkable.  MRI pending.  11:06 PM Signed out to Dr. Jacqulyn Bath in the ED.  Anticipate if MRI is negative patient to be discharged.  Amount and/or Complexity of Data Reviewed Labs: ordered. Radiology: ordered.    Final Clinical Impression(s) / ED Diagnoses Final diagnoses:  None    Rx / DC Orders ED Discharge Orders     None         Charlynne Pander, MD 09/07/23 [id number]

## 2023-09-07 NOTE — ED Triage Notes (Signed)
Pt reports dizziness x 3 weeks. Reports he has also had a sharp pain going up the left side of his neck. Denies chest pain/SHOB.

## 2023-09-08 ENCOUNTER — Other Ambulatory Visit: Payer: Self-pay | Admitting: Family Medicine

## 2023-09-08 DIAGNOSIS — I10 Essential (primary) hypertension: Secondary | ICD-10-CM

## 2023-09-08 MED ORDER — MECLIZINE HCL 25 MG PO TABS: 25.0000 mg | ORAL_TABLET | Freq: Three times a day (TID) | ORAL | 0 refills | Status: DC | PRN

## 2023-09-08 NOTE — Discharge Instructions (Signed)
Please follow closely with your PCP in the coming week. Return with any new or suddenly worsening symptoms.

## 2023-09-11 ENCOUNTER — Encounter: Payer: Self-pay | Admitting: Family Medicine

## 2023-09-11 ENCOUNTER — Ambulatory Visit (INDEPENDENT_AMBULATORY_CARE_PROVIDER_SITE_OTHER): Payer: PPO | Admitting: Family Medicine

## 2023-09-11 ENCOUNTER — Ambulatory Visit: Payer: PPO | Admitting: Audiologist

## 2023-09-11 VITALS — BP 114/72 | HR 78 | Temp 97.8°F | Ht 70.0 in | Wt 228.2 lb

## 2023-09-11 DIAGNOSIS — R42 Dizziness and giddiness: Secondary | ICD-10-CM | POA: Diagnosis not present

## 2023-09-11 NOTE — Progress Notes (Signed)
   Established Patient Office Visit   Subjective  Patient ID: John Hobbs, male    DOB: [birth year]  Age: 77 y.o. MRN: [id number]  Chief Complaint  Patient presents with   Follow-up    Recent ER visit     Patient is a 77 year old male seen for ED follow-up.  Patient seen in ED 09/07/2023 for dizziness.  Workup including MRI negative for stroke.  Given meclizine for dizziness.  Patient notes continued symptoms.  Some improvement with meclizine.  Tried to make appointment with ENT however it is not until 2025.  Patient states blood sugar and blood pressure have been good.  Patient denies any viral illness like symptoms prior to start of dizziness.      ROS Negative unless stated above    Objective:     BP 114/72   Pulse 78   Temp 97.8 F (36.6 C) (Oral)   Ht 5\' 10"  (1.778 m)   Wt 228 lb 3.2 oz (103.5 kg)   SpO2 95%   BMI 32.74 kg/m    Physical Exam Constitutional:      Appearance: Normal appearance.  HENT:     Head: Normocephalic and atraumatic.     Right Ear: Tympanic membrane normal.     Left Ear: Tympanic membrane normal.     Nose: Nose normal.     Mouth/Throat:     Mouth: Mucous membranes are moist.  Eyes:     Extraocular Movements:     Left eye: Nystagmus present.     Conjunctiva/sclera: Conjunctivae normal.     Pupils: Pupils are equal, round, and reactive to light.     Comments: Several beats of nystagmus and left eye with right sided gaze.  Epley maneuver performed.  Continued dizziness with left-sided Epley maneuver.  Cardiovascular:     Rate and Rhythm: Normal rate.     Heart sounds: Murmur heard.  Pulmonary:     Effort: Pulmonary effort is normal.     Breath sounds: Normal breath sounds.  Musculoskeletal:        General: Normal range of motion.     Cervical back: No tenderness.  Lymphadenopathy:     Cervical: No cervical adenopathy.  Skin:    General: Skin is warm and dry.  Neurological:     Mental Status: He is alert and oriented to  person, place, and time. Mental status is at baseline.     No results found for any visits on 09/11/23.    Assessment & Plan:  Vertigo  Continued dizziness due to vertigo.  Okay to continue meclizine as needed.  Epley maneuver performed in clinic.  Continued nystagmus when performed on left.  Patient given instructions on how to perform maneuver at home.  Also consider over-the-counter eardrops that help with vertigo.  Patient given strict precautions especially with driving.  For continued or worsening symptoms follow-up in clinic.  Has appointment with ENT.  If needed can try to have moved up.  Return in about 1 week (around 09/18/2023), or if symptoms worsen or fail to improve.   Deeann Saint, MD

## 2023-09-18 ENCOUNTER — Encounter: Payer: Self-pay | Admitting: Family Medicine

## 2023-09-18 ENCOUNTER — Ambulatory Visit (INDEPENDENT_AMBULATORY_CARE_PROVIDER_SITE_OTHER): Payer: PPO | Admitting: Family Medicine

## 2023-09-18 VITALS — BP 125/72 | HR 74 | Temp 97.8°F | Ht 70.0 in | Wt 231.0 lb

## 2023-09-18 DIAGNOSIS — E1140 Type 2 diabetes mellitus with diabetic neuropathy, unspecified: Secondary | ICD-10-CM

## 2023-09-18 DIAGNOSIS — I10 Essential (primary) hypertension: Secondary | ICD-10-CM | POA: Diagnosis not present

## 2023-09-18 DIAGNOSIS — R42 Dizziness and giddiness: Secondary | ICD-10-CM

## 2023-09-18 DIAGNOSIS — Z794 Long term (current) use of insulin: Secondary | ICD-10-CM | POA: Diagnosis not present

## 2023-09-18 MED ORDER — MECLIZINE HCL 25 MG PO TABS: 25.0000 mg | ORAL_TABLET | Freq: Three times a day (TID) | ORAL | 0 refills | Status: DC | PRN
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
## 2023-09-21 DIAGNOSIS — E7800 Pure hypercholesterolemia, unspecified: Secondary | ICD-10-CM | POA: Diagnosis not present

## 2023-09-21 DIAGNOSIS — E162 Hypoglycemia, unspecified: Secondary | ICD-10-CM | POA: Diagnosis not present

## 2023-09-21 DIAGNOSIS — I10 Essential (primary) hypertension: Secondary | ICD-10-CM | POA: Diagnosis not present

## 2023-09-21 DIAGNOSIS — E1165 Type 2 diabetes mellitus with hyperglycemia: Secondary | ICD-10-CM | POA: Diagnosis not present

## 2023-09-27 DIAGNOSIS — E7800 Pure hypercholesterolemia, unspecified: Secondary | ICD-10-CM | POA: Diagnosis not present

## 2023-09-27 DIAGNOSIS — I10 Essential (primary) hypertension: Secondary | ICD-10-CM | POA: Diagnosis not present

## 2023-09-27 DIAGNOSIS — E1165 Type 2 diabetes mellitus with hyperglycemia: Secondary | ICD-10-CM | POA: Diagnosis not present

## 2023-10-08 ENCOUNTER — Other Ambulatory Visit: Payer: Self-pay | Admitting: Family Medicine

## 2023-10-08 DIAGNOSIS — E1149 Type 2 diabetes mellitus with other diabetic neurological complication: Secondary | ICD-10-CM

## 2023-10-24 ENCOUNTER — Other Ambulatory Visit: Payer: Self-pay | Admitting: Family Medicine

## 2023-11-08 ENCOUNTER — Other Ambulatory Visit: Payer: Self-pay

## 2023-11-08 ENCOUNTER — Encounter (HOSPITAL_COMMUNITY): Payer: Self-pay

## 2023-11-08 ENCOUNTER — Emergency Department (HOSPITAL_COMMUNITY)
Admission: EM | Admit: 2023-11-08 | Discharge: 2023-11-08 | Disposition: A | Discharge status: Home / Self Care | Payer: PPO | Attending: Emergency Medicine | Admitting: Emergency Medicine

## 2023-11-08 ENCOUNTER — Emergency Department (HOSPITAL_COMMUNITY): Payer: PPO

## 2023-11-08 DIAGNOSIS — R509 Fever, unspecified: Secondary | ICD-10-CM | POA: Diagnosis present

## 2023-11-08 DIAGNOSIS — R55 Syncope and collapse: Secondary | ICD-10-CM | POA: Diagnosis not present

## 2023-11-08 DIAGNOSIS — R051 Acute cough: Secondary | ICD-10-CM | POA: Insufficient documentation

## 2023-11-08 DIAGNOSIS — Z7982 Long term (current) use of aspirin: Secondary | ICD-10-CM | POA: Diagnosis not present

## 2023-11-08 DIAGNOSIS — R531 Weakness: Secondary | ICD-10-CM | POA: Diagnosis not present

## 2023-11-08 DIAGNOSIS — U071 COVID-19: Secondary | ICD-10-CM | POA: Insufficient documentation

## 2023-11-08 DIAGNOSIS — R42 Dizziness and giddiness: Secondary | ICD-10-CM | POA: Diagnosis not present

## 2023-11-08 DIAGNOSIS — R059 Cough, unspecified: Secondary | ICD-10-CM | POA: Diagnosis not present

## 2023-11-08 DIAGNOSIS — E860 Dehydration: Secondary | ICD-10-CM | POA: Diagnosis not present

## 2023-11-08 DIAGNOSIS — R001 Bradycardia, unspecified: Secondary | ICD-10-CM | POA: Diagnosis not present

## 2023-11-08 DIAGNOSIS — I771 Stricture of artery: Secondary | ICD-10-CM | POA: Diagnosis not present

## 2023-11-08 DIAGNOSIS — I252 Old myocardial infarction: Secondary | ICD-10-CM | POA: Insufficient documentation

## 2023-11-08 LAB — CBC
HCT: 44 % (ref 39.0–52.0)
Hemoglobin: 14.7 g/dL (ref 13.0–17.0)
MCH: 28.7 pg (ref 26.0–34.0)
MCHC: 33.4 g/dL (ref 30.0–36.0)
MCV: 85.8 fL (ref 80.0–100.0)
Platelets: 218 10*3/uL (ref 150–400)
RBC: 5.13 MIL/uL (ref 4.22–5.81)
RDW: 15.1 % (ref 11.5–15.5)
WBC: 9.6 10*3/uL (ref 4.0–10.5)
nRBC: 0 % (ref 0.0–0.2)

## 2023-11-08 LAB — URINALYSIS, ROUTINE W REFLEX MICROSCOPIC
Bilirubin Urine: NEGATIVE
Glucose, UA: >=500 mg/dL — AB
Hgb urine dipstick: NEGATIVE
Ketones, ur: 20 mg/dL — AB
Leukocytes,Ua: NEGATIVE
Nitrite: NEGATIVE
Protein, ur: NEGATIVE mg/dL
Specific Gravity, Urine: 1.022 (ref 1.005–1.030)
pH: 5 (ref 5.0–8.0)

## 2023-11-08 LAB — RESPIRATORY PANEL BY PCR

## 2023-11-08 LAB — BASIC METABOLIC PANEL
Anion gap: 17 — ABNORMAL HIGH (ref 5–15)
BUN: 20 mg/dL (ref 8–23)
CO2: 18 mmol/L — ABNORMAL LOW (ref 22–32)
Calcium: 9 mg/dL (ref 8.9–10.3)
Chloride: 100 mmol/L (ref 98–111)
Creatinine, Ser: 1.36 mg/dL — ABNORMAL HIGH (ref 0.61–1.24)
GFR, Estimated: 54 mL/min — ABNORMAL LOW (ref >60)
Glucose, Bld: 200 mg/dL — ABNORMAL HIGH (ref 70–99)
Potassium: 3.1 mmol/L — ABNORMAL LOW (ref 3.5–5.1)
Sodium: 135 mmol/L (ref 135–145)

## 2023-11-08 LAB — RESP PANEL BY RT-PCR (RSV, FLU A&B, COVID)  RVPGX2
Influenza A by PCR: NEGATIVE
Influenza B by PCR: NEGATIVE
Resp Syncytial Virus by PCR: NEGATIVE
SARS Coronavirus 2 by RT PCR: POSITIVE — AB

## 2023-11-08 LAB — I-STAT CG4 LACTIC ACID, ED: Lactic Acid, Venous: 1.5 mmol/L (ref 0.5–1.9)

## 2023-11-08 MED ORDER — POTASSIUM CHLORIDE CRYS ER 20 MEQ PO TBCR
40.0000 meq | EXTENDED_RELEASE_TABLET | Freq: Once | ORAL | Status: AC
  Administered 2023-11-08: 40 meq via ORAL
  Filled 2023-11-08: qty 2

## 2023-11-08 MED ORDER — BENZONATATE 100 MG PO CAPS: 100.0000 mg | ORAL_CAPSULE | Freq: Three times a day (TID) | ORAL | 0 refills | Status: DC

## 2023-11-08 MED ORDER — SODIUM CHLORIDE 0.9 % IV BOLUS
1000.0000 mL | Freq: Once | INTRAVENOUS | Status: AC
  Administered 2023-11-08: 1000 mL via INTRAVENOUS

## 2023-11-08 MED ORDER — PAXLOVID (150/100) 10 X 150 MG & 10 X 100MG PO TBPK: 2.0000 | ORAL_TABLET | Freq: Two times a day (BID) | ORAL | 0 refills | Status: AC

## 2023-11-08 NOTE — Discharge Instructions (Addendum)
You were seen in the emergency department for cough and dizziness.  You tested positive for COVID.  We are prescribing you Paxlovid.  Please do not take your atorvastatin (cholesterol medication) while you take this medicine.  You should also half your dose of amlodipine (blood pressure medicine) while you are taking the Paxlovid.  Drink plenty of fluids and rest.  Follow-up with your regular doctor.  Return to the emergency department if any worsening or concerning symptoms

## 2023-11-08 NOTE — ED Notes (Signed)
Lab called regarding adding on covid swab to respiratory panel ; states will run it

## 2023-11-08 NOTE — ED Provider Notes (Signed)
[place] EMERGENCY DEPARTMENT AT [facility] Provider Note   CSN: [id number] Arrival date & time: 11/08/23  1527     History {Add pertinent medical, surgical, social history, OB history to HPI:1} Chief Complaint  Patient presents with   Near Syncope    John Hobbs is a 78 y.o. male.  He is brought in by his family for evaluation of feeling dizzy lightheaded like he might pass out.  He said symptoms started today.  He has had a cough nonproductive for few days.  He said he has been eating and drinking well, family report that he has had loss of appetite fatigue low-grade fevers.  He denies any chest pain shortness of breath abdominal pain vomiting diarrhea or urinary symptoms.  Lives at home with his wife.  The history is provided by the patient.  Near Syncope This is a new problem. The problem has been resolved. Pertinent negatives include no chest pain, no abdominal pain, no headaches and no shortness of breath. The symptoms are aggravated by walking. The symptoms are relieved by rest. He has tried rest for the symptoms. The treatment provided moderate relief.       Home Medications Prior to Admission medications   Medication Sig Start Date End Date Taking? Authorizing Provider  acetaminophen (TYLENOL) 500 MG tablet Take 1,000 mg by mouth daily.    [provider]  amLODipine (NORVASC) 5 MG tablet TAKE 1 TABLET (5 MG TOTAL) BY MOUTH DAILY. 08/11/23   Deeann Saint, MD  aspirin EC 81 MG tablet Take 81 mg by mouth daily after breakfast.    [provider]  atenolol-chlorthalidone (TENORETIC) 100-25 MG tablet TAKE 1/2 TABLET BY MOUTH EVRYDAY 07/14/23   Deeann Saint, MD  atorvastatin (LIPITOR) 20 MG tablet TAKE 1 TABLET BY MOUTH EVERY DAY 07/14/23   Deeann Saint, MD  Continuous Glucose Sensor (FREESTYLE LIBRE 3 SENSOR) MISC USE TO CHECK GLUCOSE CONTINUOUSLY, CHANGE EVERY 14 DAYS AS DIRECTED 06/05/23   Deeann Saint, MD  fexofenadine (ALLEGRA)  180 MG tablet Take 1 tablet (180 mg total) by mouth daily. 10/08/21   Deeann Saint, MD  glimepiride (AMARYL) 1 MG tablet TAKE 1 TABLET(1 MG) BY MOUTH DAILY WITH SUPPER 10/27/23   Deeann Saint, MD  glimepiride (AMARYL) 2 MG tablet TAKE 1 TABLET BY MOUTH DAILY WITH BREAKFAST 08/30/23   Deeann Saint, MD  glucose blood (ONETOUCH VERIO) test strip Use as instructed 06/05/23   Deeann Saint, MD  glucose blood test strip SMARTSIG:Via Meter As Directed 06/04/23   [provider]  glucose blood test strip  06/04/23   [provider]  Insulin Lispro Prot & Lispro (HUMALOG MIX 75/25 KWIKPEN) (75-25) 100 UNIT/ML Kwikpen Inject 8 Units into the skin daily.    [provider]  Insulin Lispro Prot & Lispro (HUMALOG MIX 75/25 KWIKPEN) (75-25) 100 UNIT/ML Kwikpen 12u Subcutaneous once a day am 08/15/22   [provider]  Insulin Pen Needle (BD PEN NEEDLE NANO U/F) 32G X 4 MM MISC Use with insulin pen up to 2 times daily. 05/22/23   Deeann Saint, MD  isosorbide mononitrate (IMDUR) 30 MG 24 hr tablet TAKE ONE HALF TABLET BY MOUTH DAILY 07/14/23   Kathleene Hazel, MD  Lancets [facility] ULTRASOFT) lancets Use to check blood sugar once daily 01/22/20   Deeann Saint, MD  linagliptin (TRADJENTA) 5 MG TABS tablet Take 5 mg by mouth daily.    [provider]  losartan (COZAAR) 50 MG tablet TAKE 1 TAB BY MOUTH DAILY 09/12/23   Deeann Saint, MD  meclizine (ANTIVERT) 25 MG tablet Take 1 tablet (25 mg total) by mouth 3 (three) times daily as needed for dizziness. 09/18/23   Deeann Saint, MD  metFORMIN (GLUCOPHAGE) 1000 MG tablet TAKE 1 TABLET(1000 MG) BY MOUTH TWICE DAILY WITH A MEAL 10/12/23   Deeann Saint, MD  ONE TOUCH LANCETS MISC Test once day 03/26/14   Terressa Koyanagi, DO  Potassium Chloride ER 20 MEQ TBCR TAKE 1/2 TABLET BY MOUTH DAILY 08/30/23   Deeann Saint, MD  potassium chloride SA (KLOR-CON M) 20 MEQ tablet TAKE 1/2 TABLET( 10 MEQ) BY  MOUTH DAILY 07/18/22   Deeann Saint, MD      Allergies    Lisinopril    Review of Systems   Review of Systems  Constitutional:  Positive for fever.  Eyes:  Negative for visual disturbance.  Respiratory:  Positive for cough. Negative for shortness of breath.   Cardiovascular:  Positive for near-syncope. Negative for chest pain.  Gastrointestinal:  Negative for abdominal pain, nausea and vomiting.  Genitourinary:  Negative for dysuria.  Neurological:  Negative for headaches.    Physical Exam Updated Vital Signs BP 91/73 (BP Location: Right Arm)   Pulse 83   Temp 99.1 F (37.3 C)   Resp 18   SpO2 97%  Physical Exam Vitals and nursing note reviewed.  Constitutional:      General: He is not in acute distress.    Appearance: Normal appearance. He is well-developed.  HENT:     Head: Normocephalic and atraumatic.  Eyes:     Conjunctiva/sclera: Conjunctivae normal.  Cardiovascular:     Rate and Rhythm: Normal rate and regular rhythm.     Heart sounds: No murmur heard. Pulmonary:     Effort: Pulmonary effort is normal. No respiratory distress.     Breath sounds: Normal breath sounds.  Abdominal:     Palpations: Abdomen is soft.     Tenderness: There is no abdominal tenderness. There is no guarding or rebound.  Musculoskeletal:        General: Normal range of motion.     Cervical back: Neck supple.     Right lower leg: No edema.     Left lower leg: No edema.  Skin:    General: Skin is warm and dry.     Capillary Refill: Capillary refill takes less than 2 seconds.  Neurological:     General: No focal deficit present.     Mental Status: He is alert and oriented to person, place, and time.     Cranial Nerves: No cranial nerve deficit.     Sensory: No sensory deficit.     Motor: No weakness.     ED Results / Procedures / Treatments   Labs (all labs ordered are listed, but only abnormal results are displayed) Labs Reviewed  RESPIRATORY PANEL BY PCR  RESP PANEL BY  RT-PCR (RSV, FLU A&B, COVID)  [id number]  CBC  BASIC METABOLIC PANEL  URINALYSIS, ROUTINE W REFLEX MICROSCOPIC  CBG MONITORING, ED  I-STAT CG4 LACTIC ACID, ED    EKG EKG Interpretation Date/Time:  Wednesday November 08 2023 15:48:04 EST Ventricular Rate:  88 PR Interval:  201 QRS Duration:  98 QT Interval:  369 QTC Calculation: 447 R Axis:   235  Text Interpretation: Sinus rhythm Inferior infarct, old Abnormal lateral Q waves Anterior infarct, old No  significant change since prior 11/24 Confirmed by Meridee Score [phone]) on 11/08/2023 3:51:43 PM  Radiology No results found.  Procedures Procedures  {Document cardiac monitor, telemetry assessment procedure when appropriate:1}  Medications Ordered in ED Medications  sodium chloride 0.9 % bolus 1,000 mL (has no administration in time range)    ED Course/ Medical Decision Making/ A&P   {   Click here for ABCD2, HEART and other calculatorsREFRESH Note before signing :1}                              Medical Decision Making Amount and/or Complexity of Data Reviewed Labs: ordered. Radiology: ordered.   This patient complains of ***; this involves an extensive number of treatment Options and is a complaint that carries with it a high risk of complications and morbidity. The differential includes ***  I ordered, reviewed and interpreted labs, which included *** I ordered medication *** and reviewed PMP when indicated. I ordered imaging studies which included *** and I independently    visualized and interpreted imaging which showed *** Additional history obtained from *** Previous records obtained and reviewed *** I consulted *** and discussed lab and imaging findings and discussed disposition.  Cardiac monitoring reviewed, *** Social determinants considered, *** Critical Interventions: ***  After the interventions stated above, I reevaluated the patient and found *** Admission and further testing considered,  ***   {Document critical care time when appropriate:1} {Document review of labs and clinical decision tools ie heart score, Chads2Vasc2 etc:1}  {Document your independent review of radiology images, and any outside records:1} {Document your discussion with family members, caretakers, and with consultants:1} {Document social determinants of health affecting pt's care:1} {Document your decision making why or why not admission, treatments were needed:1} Final Clinical Impression(s) / ED Diagnoses Final diagnoses:  None    Rx / DC Orders ED Discharge Orders     None

## 2023-11-08 NOTE — ED Triage Notes (Signed)
[facility] reports pt coming from home. For the past 2-3 days pt has been light headed, weak, near syncope. Loss of appetite, fatigue and low grade fever and cough, dark colored urine.

## 2023-11-08 NOTE — ED Notes (Signed)
Orthostatic vitals: Laying 109/75; HR 74 Sitting 124/82; HR 81 Standing 108/80; HR 81  Pulse ox while ambulating - remaining 98-99% on room air

## 2023-11-08 NOTE — ED Notes (Signed)
Patient given water and cracker for fluid/po challenge

## 2023-11-16 ENCOUNTER — Telehealth: Payer: Self-pay

## 2023-11-16 NOTE — Progress Notes (Signed)
Transition Care Management Unsuccessful Follow-up Telephone Call  Date of discharge and from where:  [facility]bs 1/22  Attempts:  1st Attempt  Reason for unsuccessful TCM follow-up call:  No answer/busy

## 2023-11-17 ENCOUNTER — Telehealth: Payer: Self-pay

## 2023-11-17 NOTE — Progress Notes (Signed)
Transition Care Management Follow-up Telephone Call Date of discharge and from where: [facility]bs 1/22 How have you been since you were released from the hospital? Patient is doing well and following up with providers Any questions or concerns? No  Items Reviewed: Did the pt receive and understand the discharge instructions provided? Yes  Medications obtained and verified? Yes  Other? No  Any new allergies since your discharge? No  Dietary orders reviewed? No Do you have support at home? Yes     Follow up appointments reviewed:  PCP Hospital f/u appt confirmed? Yes  Scheduled to see PCP on 2/5 @ . Specialist Hospital f/u appt confirmed? No  Scheduled to see  on  @ . Are transportation arrangements needed? No  If their condition worsens, is the pt aware to call PCP or go to the Emergency Dept.? Yes Was the patient provided with contact information for the PCP's office or ED? Yes Was to pt encouraged to call back with questions or concerns? Yes

## 2023-11-20 ENCOUNTER — Ambulatory Visit (INDEPENDENT_AMBULATORY_CARE_PROVIDER_SITE_OTHER): Payer: PPO | Admitting: Family Medicine

## 2023-11-20 ENCOUNTER — Encounter: Payer: Self-pay | Admitting: Family Medicine

## 2023-11-20 VITALS — BP 112/78 | HR 77 | Temp 98.7°F | Ht 70.0 in | Wt 226.0 lb

## 2023-11-20 DIAGNOSIS — R2230 Localized swelling, mass and lump, unspecified upper limb: Secondary | ICD-10-CM | POA: Diagnosis not present

## 2023-11-20 DIAGNOSIS — N179 Acute kidney failure, unspecified: Secondary | ICD-10-CM | POA: Diagnosis not present

## 2023-11-20 DIAGNOSIS — S61401A Unspecified open wound of right hand, initial encounter: Secondary | ICD-10-CM

## 2023-11-20 DIAGNOSIS — Z794 Long term (current) use of insulin: Secondary | ICD-10-CM

## 2023-11-20 DIAGNOSIS — E876 Hypokalemia: Secondary | ICD-10-CM | POA: Diagnosis not present

## 2023-11-20 DIAGNOSIS — E1140 Type 2 diabetes mellitus with diabetic neuropathy, unspecified: Secondary | ICD-10-CM | POA: Diagnosis not present

## 2023-11-20 DIAGNOSIS — Z23 Encounter for immunization: Secondary | ICD-10-CM | POA: Diagnosis not present

## 2023-11-20 DIAGNOSIS — U071 COVID-19: Secondary | ICD-10-CM | POA: Diagnosis not present

## 2023-11-20 LAB — CBC WITH DIFFERENTIAL/PLATELET
Basophils Absolute: 0 10*3/uL (ref 0.0–0.1)
Basophils Relative: 0.5 % (ref 0.0–3.0)
Eosinophils Absolute: 0.2 10*3/uL (ref 0.0–0.7)
Eosinophils Relative: 2.6 % (ref 0.0–5.0)
HCT: 44 % (ref 39.0–52.0)
Hemoglobin: 14.1 g/dL (ref 13.0–17.0)
Lymphocytes Relative: 30.2 % (ref 12.0–46.0)
Lymphs Abs: 2.2 10*3/uL (ref 0.7–4.0)
MCHC: 32 g/dL (ref 30.0–36.0)
MCV: 88 fL (ref 78.0–100.0)
Monocytes Absolute: 0.6 10*3/uL (ref 0.1–1.0)
Monocytes Relative: 7.7 % (ref 3.0–12.0)
Neutro Abs: 4.3 10*3/uL (ref 1.4–7.7)
Neutrophils Relative %: 59 % (ref 43.0–77.0)
Platelets: 282 10*3/uL (ref 150.0–400.0)
RBC: 5 Mil/uL (ref 4.22–5.81)
RDW: 14.3 % (ref 11.5–15.5)
WBC: 7.3 10*3/uL (ref 4.0–10.5)

## 2023-11-20 LAB — BASIC METABOLIC PANEL
BUN: 16 mg/dL (ref 6–23)
CO2: 28 meq/L (ref 19–32)
Calcium: 9.3 mg/dL (ref 8.4–10.5)
Chloride: 103 meq/L (ref 96–112)
Creatinine, Ser: 1.17 mg/dL (ref 0.40–1.50)
GFR: 61.13 mL/min (ref >60.00)
Glucose, Bld: 191 mg/dL — ABNORMAL HIGH (ref 70–99)
Potassium: 3.3 meq/L — ABNORMAL LOW (ref 3.5–5.1)
Sodium: 139 meq/L (ref 135–145)

## 2023-11-20 LAB — MAGNESIUM: Magnesium: 2.1 mg/dL (ref 1.5–2.5)

## 2023-11-20 LAB — POCT GLYCOSYLATED HEMOGLOBIN (HGB A1C): Hemoglobin A1C: 8 % — AB (ref 4.0–5.6)

## 2023-11-20 MED ORDER — POTASSIUM CHLORIDE ER 20 MEQ PO TBCR: 1.0000 | EXTENDED_RELEASE_TABLET | Freq: Every day | ORAL | 1 refills | Status: DC
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
## 2023-11-20 NOTE — Patient Instructions (Signed)
It appears she last saw the endocrinologist, Dr. Cleon Gustin on 09/21/2023.  At that time you were to be taking Synjardy XR 25-1000 mg daily with breakfast, glimepiride 1 mg daily with breakfast, and Tradjenta 5 mg daily.  Your hemoglobin A1c was 8.0% today.  It was 7.8% on 08/02/2023.  Is important that you make changes to diet and increase your physical activity to help improve this number.  It is also important that we know which medications you are actually taking.  A referral to the hand surgeon was placed.  They will contact you about setting up an appointment.  I have placed orders for labs to recheck your potassium.  As it was low at 3.1 on 11/08/2023 during your ED visit.  We will have you take a whole tab of potassium daily instead of the half a tab you have been taking.  For the next 3 days take 2 whole tabs (40 mEq) of your potassium tablets..  Then begin taking 1 tab daily (20 mEq)

## 2023-11-21 ENCOUNTER — Encounter: Payer: Self-pay | Admitting: Family Medicine

## 2023-12-07 DIAGNOSIS — R2230 Localized swelling, mass and lump, unspecified upper limb: Secondary | ICD-10-CM | POA: Diagnosis not present

## 2023-12-15 ENCOUNTER — Other Ambulatory Visit: Payer: Self-pay | Admitting: Family Medicine

## 2024-01-01 DIAGNOSIS — E1165 Type 2 diabetes mellitus with hyperglycemia: Secondary | ICD-10-CM | POA: Diagnosis not present

## 2024-01-01 DIAGNOSIS — E7800 Pure hypercholesterolemia, unspecified: Secondary | ICD-10-CM | POA: Diagnosis not present

## 2024-01-01 LAB — HEMOGLOBIN A1C: Hemoglobin A1C: 8.3

## 2024-01-02 DIAGNOSIS — E7800 Pure hypercholesterolemia, unspecified: Secondary | ICD-10-CM | POA: Diagnosis not present

## 2024-01-02 DIAGNOSIS — E162 Hypoglycemia, unspecified: Secondary | ICD-10-CM | POA: Diagnosis not present

## 2024-01-02 DIAGNOSIS — I10 Essential (primary) hypertension: Secondary | ICD-10-CM | POA: Diagnosis not present

## 2024-01-02 DIAGNOSIS — E1165 Type 2 diabetes mellitus with hyperglycemia: Secondary | ICD-10-CM | POA: Diagnosis not present

## 2024-01-17 ENCOUNTER — Other Ambulatory Visit (INDEPENDENT_AMBULATORY_CARE_PROVIDER_SITE_OTHER)

## 2024-01-17 ENCOUNTER — Ambulatory Visit: Payer: PPO | Admitting: Family Medicine

## 2024-01-17 DIAGNOSIS — E1140 Type 2 diabetes mellitus with diabetic neuropathy, unspecified: Secondary | ICD-10-CM

## 2024-01-17 DIAGNOSIS — Z794 Long term (current) use of insulin: Secondary | ICD-10-CM

## 2024-01-17 NOTE — Progress Notes (Signed)
   01/17/2024  Patient ID: John Hobbs, male   DOB: 08/03/1946, 78 y.o.   MRN: [id number]  Patient presented in office for medication review. Brought in all medications as expected.  Patient stopped wearing his sensors because he reported the app stopped working. Was able to reboot app in office today and applied a freestyle libre 3 plus sensor sample in office.  Patient will follow up in office in 2 weeks to assess if sensor worked well and to evaluate how his current medications are helping with BG.  Sherrill Raring, PharmD Clinical Pharmacist [phone]

## 2024-01-19 ENCOUNTER — Encounter: Payer: Self-pay | Admitting: Family Medicine

## 2024-01-19 ENCOUNTER — Ambulatory Visit (INDEPENDENT_AMBULATORY_CARE_PROVIDER_SITE_OTHER): Admitting: Family Medicine

## 2024-01-19 VITALS — BP 110/74 | HR 90 | Temp 98.0°F | Ht 70.0 in | Wt 226.4 lb

## 2024-01-19 DIAGNOSIS — I10 Essential (primary) hypertension: Secondary | ICD-10-CM | POA: Diagnosis not present

## 2024-01-19 DIAGNOSIS — Z794 Long term (current) use of insulin: Secondary | ICD-10-CM

## 2024-01-19 DIAGNOSIS — E1140 Type 2 diabetes mellitus with diabetic neuropathy, unspecified: Secondary | ICD-10-CM

## 2024-01-19 DIAGNOSIS — S61431D Puncture wound without foreign body of right hand, subsequent encounter: Secondary | ICD-10-CM | POA: Diagnosis not present

## 2024-01-19 DIAGNOSIS — R2230 Localized swelling, mass and lump, unspecified upper limb: Secondary | ICD-10-CM | POA: Diagnosis not present

## 2024-01-19 DIAGNOSIS — H9191 Unspecified hearing loss, right ear: Secondary | ICD-10-CM | POA: Diagnosis not present
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
## 2024-01-31 ENCOUNTER — Other Ambulatory Visit

## 2024-01-31 ENCOUNTER — Ambulatory Visit

## 2024-02-07 ENCOUNTER — Ambulatory Visit (INDEPENDENT_AMBULATORY_CARE_PROVIDER_SITE_OTHER)

## 2024-02-07 DIAGNOSIS — E1140 Type 2 diabetes mellitus with diabetic neuropathy, unspecified: Secondary | ICD-10-CM

## 2024-02-07 DIAGNOSIS — Z794 Long term (current) use of insulin: Secondary | ICD-10-CM

## 2024-02-07 NOTE — Progress Notes (Addendum)
   02/07/2024  Patient ID: John Hobbs, male   DOB: 1945/11/07, 78 y.o.   MRN: [id number]  Patient presented in office for review of CGM use.  Patient states he did not feel the freestyle libre 3 plus was accurate. He would prick his finger whenever the sensor alerted, and his BG would be 20-30 higher than what the sensor showed.  Wants to try dexcom instead. Applied 1 dexcom g7 sensor in office and setup. Patient will return for follow up in around 10 days to see if he wants to use or stick with finger prick testing.  Follow Up: 02/19/24  Carnell Christian, PharmD Clinical Pharmacist [phone]

## 2024-02-07 NOTE — Progress Notes (Deleted)
   02/07/2024  Patient ID: John Hobbs, male   DOB: 1946/03/18, 78 y.o.   MRN: [id number]  Patient presented in office for medication review. Brought in all medications as expected.  Patient stopped wearing his sensors because he reported the app stopped working. Was able to reboot app in office today and applied a freestyle libre 3 plus sensor sample in office.  Patient will follow up in office in 2 weeks to assess if sensor worked well and to evaluate how his current medications are helping with BG.  Carnell Christian, PharmD Clinical Pharmacist [phone]

## 2024-02-19 ENCOUNTER — Ambulatory Visit (INDEPENDENT_AMBULATORY_CARE_PROVIDER_SITE_OTHER)

## 2024-02-19 DIAGNOSIS — Z794 Long term (current) use of insulin: Secondary | ICD-10-CM

## 2024-02-19 DIAGNOSIS — E1140 Type 2 diabetes mellitus with diabetic neuropathy, unspecified: Secondary | ICD-10-CM

## 2024-02-19 NOTE — Progress Notes (Signed)
 02/19/2024 Name: John Hobbs MRN: [id number] DOB: Jun 12, 1946  Chief Complaint  Patient presents with   Diabetes   Medication Management    John Hobbs is a 78 y.o. year old male who presented for a telephone visit.   They were referred to the pharmacist by a quality report for assistance in managing diabetes (TNM).    Subjective:  Care Team: Primary Care Provider: Viola Greulich, MD ; Next Scheduled Visit: 04/22/24 Endocrinologist Dr. Ronelle Coffee; Next Scheduled Visit: 05/14/24  Medication Access/Adherence  Current Pharmacy:  [id number] - [place], [place] - [address] AT [facility] [address] [place] [address] Phone: [phone] Fax: [phone]   Patient reports affordability concerns with their medications: No  Patient reports access/transportation concerns to their pharmacy: No  Patient reports adherence concerns with their medications:  No     Diabetes:  Current medications: Glimepiride  2mg  twice daily, Synjardy XR 25-1000mg  once daily, Tradjenta 5mg  daily Medications tried in the past: Invokana , Novolin 70/30, Glipizide , Actos , Prandin   Current glucose readings:  Used Dexcom sample provided at last office appoint. Not enough data to generate full report. No low BG but overall sugars running above goal 38% of the time and in range 62%. Excursions occurring after meals but staying elevated >2 hour post-meal  Observed patterns:  Patient denies hypoglycemic s/sx including dizziness, shakiness, sweating. Patient denies hyperglycemic symptoms including polyuria, polydipsia, polyphagia, nocturia, neuropathy, blurred vision.   Objective:  Lab Results  Component Value Date   HGBA1C 8.0 (A) 11/20/2023    Lab Results  Component Value Date   CREATININE 1.17 11/20/2023   BUN 16 11/20/2023   NA 139 11/20/2023   K 3.3 (L) 11/20/2023   CL 103 11/20/2023   CO2 28 11/20/2023    Lab Results  Component Value  Date   CHOL 106 10/25/2022   HDL 44 10/25/2022   LDLCALC 49 10/25/2022   TRIG 60 10/25/2022   CHOLHDL 2.4 10/25/2022    Medications Reviewed Today     Reviewed by Carnell Christian, RPH (Pharmacist) on 02/19/24 at 1527  Med List Status: <None>   Medication Order Taking? Sig Documenting Provider Last Dose Status Informant  acetaminophen  (TYLENOL ) 500 MG tablet [id number] No Take 1,000 mg by mouth daily. [provider] Taking Active Self  amLODipine  (NORVASC ) 5 MG tablet [id number] No TAKE 1 TABLET (5 MG TOTAL) BY MOUTH DAILY. Viola Greulich, MD Taking Active   aspirin  EC 81 MG tablet [id number] No Take 81 mg by mouth daily after breakfast. [provider] Taking Active Self  atenolol -chlorthalidone  (TENORETIC ) 100-25 MG tablet [id number] No TAKE 1/2 TABLET BY MOUTH EVRYDAY Viola Greulich, MD Taking Active   atorvastatin  (LIPITOR) 20 MG tablet [id number] No TAKE 1 TABLET BY MOUTH EVERY DAY Viola Greulich, MD Taking Active   Continuous Glucose Sensor (FREESTYLE LIBRE 3 SENSOR) [place] [id number] No USE TO CHECK GLUCOSE CONTINUOUSLY, CHANGE EVERY 14 DAYS AS DIRECTED Viola Greulich, MD Taking Active   fexofenadine  (ALLEGRA ) 180 MG tablet [id number] No Take 1 tablet (180 mg total) by mouth daily. Viola Greulich, MD Taking Active Self  glimepiride  (AMARYL ) 2 MG tablet [id number] No TAKE 1 TABLET BY MOUTH DAILY WITH BREAKFAST Viola Greulich, MD Taking Active            Med Note Joaquin Mulberry Feb 19, 2024  3:27 PM) Taking 2mg  BID  glucose blood (ONETOUCH VERIO) test strip  [id number] No Use as instructed Viola Greulich, MD Taking Active   glucose blood test strip [id number] No SMARTSIG:Via Meter As Directed [provider] Taking Active   glucose blood test strip [id number] No  [provider] Taking Active   Insulin  Pen Needle (BD PEN NEEDLE NANO U/F) 32G X 4 MM MISC [id number] No Use with insulin  pen up to 2 times daily. Viola Greulich, MD Taking  Active   isosorbide  mononitrate (IMDUR ) 30 MG 24 hr tablet [id number] No TAKE ONE HALF TABLET BY MOUTH DAILY Odie Benne, MD Taking Active   Lancets [facility] ULTRASOFT) lancets [id number] No Use to check blood sugar once daily Viola Greulich, MD Taking Active Self  linagliptin (TRADJENTA) 5 MG TABS tablet [id number] No Take 5 mg by mouth daily. [provider] Taking Active Self  losartan  (COZAAR ) 50 MG tablet [id number] No TAKE 1 TAB BY MOUTH DAILY Viola Greulich, MD Taking Active   meclizine  (ANTIVERT ) 25 MG tablet [id number] No Take 1 tablet (25 mg total) by mouth 3 (three) times daily as needed for dizziness. Viola Greulich, MD Taking Active   ONE [facility] MISC [id number] No Test once day Maurie Southern, DO Taking Active Self  Potassium Chloride  ER 20 MEQ TBCR [id number] No Take 1 tablet (20 mEq total) by mouth daily. Viola Greulich, MD Taking Active   SYNJARDY XR 25-1000 MG TB24 [id number] No Take 1 tablet by mouth every morning. [provider] Taking Active               Assessment/Plan:   Diabetes: - Currently uncontrolled - Reviewed long term cardiovascular and renal outcomes of uncontrolled blood sugar - Reviewed goal A1c, goal fasting, and goal 2 hour post prandial glucose - Reviewed dietary modifications including low carb diet - Reviewed lifestyle modifications including: exercise, patient started back walking today, encouraged to continue to do so - Recommend to continue current medication therapy but reach out to endo office to notify of continued elevated readings to discuss potential med changes  - Patient denies personal or family history of multiple endocrine neoplasia type 2, medullary thyroid  cancer; personal history of pancreatitis or gallbladder disease. - Recommend to check glucose twice daily, submitted order via parachute for dexcom sensors, documenting that patient has history of severe blood glucose low requiring intervention  (most recent 01/21/24, low of 53, wife had to get patient food to raise sugar).    Follow Up Plan: 2 months  Carnell Christian, PharmD Clinical Pharmacist [phone]

## 2024-02-20 ENCOUNTER — Other Ambulatory Visit: Payer: Self-pay | Admitting: Family Medicine

## 2024-02-20 DIAGNOSIS — E1149 Type 2 diabetes mellitus with other diabetic neurological complication: Secondary | ICD-10-CM

## 2024-02-20 DIAGNOSIS — I10 Essential (primary) hypertension: Secondary | ICD-10-CM

## 2024-03-08 ENCOUNTER — Encounter: Payer: Self-pay | Admitting: Adult Health

## 2024-03-08 ENCOUNTER — Ambulatory Visit: Payer: Self-pay

## 2024-03-08 ENCOUNTER — Ambulatory Visit (INDEPENDENT_AMBULATORY_CARE_PROVIDER_SITE_OTHER): Admitting: Adult Health

## 2024-03-08 VITALS — BP 110/64 | HR 64 | Temp 97.6°F | Ht 70.0 in | Wt 221.0 lb

## 2024-03-08 DIAGNOSIS — G8929 Other chronic pain: Secondary | ICD-10-CM

## 2024-03-08 DIAGNOSIS — R300 Dysuria: Secondary | ICD-10-CM

## 2024-03-08 DIAGNOSIS — R42 Dizziness and giddiness: Secondary | ICD-10-CM

## 2024-03-08 DIAGNOSIS — M545 Low back pain, unspecified: Secondary | ICD-10-CM | POA: Diagnosis not present

## 2024-03-08 LAB — BASIC METABOLIC PANEL WITH GFR
BUN: 22 mg/dL (ref 6–23)
CO2: 31 meq/L (ref 19–32)
Calcium: 9.6 mg/dL (ref 8.4–10.5)
Chloride: 98 meq/L (ref 96–112)
Creatinine, Ser: 1.29 mg/dL (ref 0.40–1.50)
GFR: 54.26 mL/min — ABNORMAL LOW (ref >60.00)
Glucose, Bld: 220 mg/dL — ABNORMAL HIGH (ref 70–99)
Potassium: 3.5 meq/L (ref 3.5–5.1)
Sodium: 138 meq/L (ref 135–145)

## 2024-03-08 LAB — URINALYSIS
Bilirubin Urine: NEGATIVE
Hgb urine dipstick: NEGATIVE
Ketones, ur: NEGATIVE
Leukocytes,Ua: NEGATIVE
Nitrite: NEGATIVE
Specific Gravity, Urine: 1.01 (ref 1.000–1.030)
Total Protein, Urine: NEGATIVE
Urine Glucose: >=1000 — AB
Urobilinogen, UA: 0.2 (ref 0.0–1.0)
pH: 6 (ref 5.0–8.0)

## 2024-03-08 LAB — CBC
HCT: 46.1 % (ref 39.0–52.0)
Hemoglobin: 15.1 g/dL (ref 13.0–17.0)
MCHC: 32.7 g/dL (ref 30.0–36.0)
MCV: 87 fl (ref 78.0–100.0)
Platelets: 268 10*3/uL (ref 150.0–400.0)
RBC: 5.3 Mil/uL (ref 4.22–5.81)
RDW: 13.9 % (ref 11.5–15.5)
WBC: 6.4 10*3/uL (ref 4.0–10.5)

## 2024-03-08 MED ORDER — MECLIZINE HCL 25 MG PO TABS: 25.0000 mg | ORAL_TABLET | Freq: Three times a day (TID) | ORAL | 0 refills | Status: DC | PRN

## 2024-03-08 NOTE — Telephone Encounter (Signed)
 Patient has an appt 5/23

## 2024-03-08 NOTE — Telephone Encounter (Signed)
  Chief Complaint: dizziness Symptoms: head swimmy and feels off Frequency: all week  Pertinent Negatives: NA Disposition: [] ED /[] Urgent Care (no appt availability in office) / [x] Appointment(In office/virtual)/ []  [facility]/ [] Home Care/ [] Refused Recommended Disposition /[] [facility]/ []  Follow-up with PCP Additional Notes: pt states he has been dealing with dizziness. Not position changes related at times. Last episode today when present with daughter. Scheduled appt today at 1130.   Copied from CRM [id number]. Topic: Clinical - Red Word Triage >> Mar 08, 2024 10:08 AM Kita Perish H wrote: [facility] that prompted transfer to Nurse Triage: Dizziness feeling like head is swimming Reason for Disposition  [1] MODERATE dizziness (e.g., vertigo; feels very unsteady, interferes with normal activities) AND [2] has NOT been evaluated by doctor (or NP/PA) for this  Answer Assessment - Initial Assessment Questions 1. DESCRIPTION: "Describe your dizziness."     Head swimming  2. VERTIGO: "Do you feel like either you or the room is spinning or tilting?"      yes 4. SEVERITY: "How bad is it?"  "Can you walk?"   - MILD: Feels slightly dizzy and unsteady, but is walking normally.   - MODERATE: Feels unsteady when walking, but not falling; interferes with normal activities (e.g., school, work).   - SEVERE: Unable to walk without falling, or requires assistance to walk without falling.     Mild  5. ONSET:  "When did the dizziness begin?"     All week  6. AGGRAVATING FACTORS: "Does anything make it worse?" (e.g., standing, change in head position)     no 9. OTHER SYMPTOMS: "Do you have any other symptoms?" (e.g., headache, weakness, numbness, vomiting, earache)     no  Protocols used: Dizziness - Vertigo-A-AH
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
## 2024-03-12 ENCOUNTER — Ambulatory Visit: Payer: Self-pay | Admitting: Adult Health

## 2024-03-18 ENCOUNTER — Other Ambulatory Visit (INDEPENDENT_AMBULATORY_CARE_PROVIDER_SITE_OTHER)

## 2024-03-18 DIAGNOSIS — E1140 Type 2 diabetes mellitus with diabetic neuropathy, unspecified: Secondary | ICD-10-CM

## 2024-03-18 DIAGNOSIS — Z794 Long term (current) use of insulin: Secondary | ICD-10-CM

## 2024-03-18 NOTE — Progress Notes (Signed)
 03/18/2024 Name: THESEUS Hobbs MRN: [id number] DOB: 04-14-1946  Chief Complaint  Patient presents with   Diabetes   Medication Management    John Hobbs is a 78 y.o. year old male who presented for a telephone visit.   They were referred to the pharmacist by a quality report for assistance in managing diabetes (TNM).    Subjective:  Care Team: Primary Care Provider: Viola Greulich, MD ; Next Scheduled Visit: 04/22/24 Endocrinologist Dr. Ronelle Coffee; Next Scheduled Visit: 05/14/24  Medication Access/Adherence  Current Pharmacy:  [id number] - [place], [place] - [address] AT [address] [place] [address] Phone: [phone] Fax: [phone]   Patient reports affordability concerns with their medications: No  Patient reports access/transportation concerns to their pharmacy: No  Patient reports adherence concerns with their medications:  No     Diabetes:  Current medications: Glimepiride  2mg  twice daily, Synjardy XR 25-1000mg  once daily, Tradjenta 5mg  daily Medications tried in the past: Invokana , Novolin 70/30, Glipizide , Actos , Prandin   Current glucose readings:  Reports he has not checked yet today, tries to check once daily at varying times. Reports he missed yesterday though. Most recent sugar was 85 fasting per patient. Denies seeing any fasting sugars above 140. Does report some excursions into the upper 200s post-prandial if he eats poorly  Observed patterns:  Patient denies hypoglycemic s/sx including dizziness, shakiness, sweating. Patient denies hyperglycemic symptoms including polyuria, polydipsia, polyphagia, nocturia, neuropathy, blurred vision.  Reports sensors were too $$$ and prefers to prick finger for BG monitoring at this time   Objective:  Lab Results  Component Value Date   HGBA1C 8.0 (A) 11/20/2023    Lab Results  Component Value Date   CREATININE 1.29 03/08/2024   BUN 22  03/08/2024   NA 138 03/08/2024   K 3.5 03/08/2024   CL 98 03/08/2024   CO2 31 03/08/2024    Lab Results  Component Value Date   CHOL 106 10/25/2022   HDL 44 10/25/2022   LDLCALC 49 10/25/2022   TRIG 60 10/25/2022   CHOLHDL 2.4 10/25/2022    Medications Reviewed Today     Reviewed by Carnell Christian, RPH (Pharmacist) on 03/18/24 at 1429  Med List Status: <None>   Medication Order Taking? Sig Documenting Provider Last Dose Status Informant  acetaminophen  (TYLENOL ) 500 MG tablet [id number] No Take 1,000 mg by mouth daily. [provider] Taking Active Self  amLODipine  (NORVASC ) 5 MG tablet [id number] No TAKE 1 TABLET (5 MG TOTAL) BY MOUTH DAILY. Viola Greulich, MD Taking Active   aspirin  EC 81 MG tablet [id number] No Take 81 mg by mouth daily after breakfast. [provider] Taking Active Self  atenolol -chlorthalidone  (TENORETIC ) 100-25 MG tablet [id number] No TAKE 1/2 TABLET BY MOUTH EVRYDAY Viola Greulich, MD Taking Active   atorvastatin  (LIPITOR) 20 MG tablet [id number] No TAKE 1 TABLET BY MOUTH EVERY DAY Viola Greulich, MD Taking Active   Continuous Glucose Sensor (FREESTYLE LIBRE 3 SENSOR) [place] [id number] No USE TO CHECK GLUCOSE CONTINUOUSLY, CHANGE EVERY 14 DAYS AS DIRECTED Viola Greulich, MD Taking Active   fexofenadine  (ALLEGRA ) 180 MG tablet [id number] No Take 1 tablet (180 mg total) by mouth daily. Viola Greulich, MD Taking Active Self  glimepiride  (AMARYL ) 2 MG tablet [id number] No TAKE 1 TABLET BY MOUTH DAILY WITH BREAKFAST Viola Greulich, MD Taking Active            Med Note (  Carnell Christian   Mon Mar 18, 2024  2:29 PM) Taking 1 tab BID for total of 4mg   glucose blood (ONETOUCH VERIO) test strip [id number] No Use as instructed Viola Greulich, MD Taking Active   glucose blood test strip [id number] No SMARTSIG:Via Meter As Directed [provider] Taking Active   glucose blood test strip [id number] No  [provider] Taking Active    Insulin  Pen Needle (BD PEN NEEDLE NANO U/F) 32G X 4 MM MISC [id number] No Use with insulin  pen up to 2 times daily. Viola Greulich, MD Taking Active   isosorbide  mononitrate (IMDUR ) 30 MG 24 hr tablet [id number] No TAKE ONE HALF TABLET BY MOUTH DAILY Odie Benne, MD Taking Active   Lancets [facility] ULTRASOFT) lancets [id number] No Use to check blood sugar once daily Viola Greulich, MD Taking Active Self  linagliptin (TRADJENTA) 5 MG TABS tablet [id number] No Take 5 mg by mouth daily. [provider] Taking Active Self  losartan  (COZAAR ) 50 MG tablet [id number] No TAKE 1 TAB BY MOUTH DAILY Viola Greulich, MD Taking Active   meclizine  (ANTIVERT ) 25 MG tablet [id number]  Take 1 tablet (25 mg total) by mouth 3 (three) times daily as needed for dizziness. Alto Atta, NP  Active   ONE TOUCH LANCETS MISC [id number] No Test once day Maurie Southern, DO Taking Active Self  Potassium Chloride  ER 20 MEQ TBCR [id number] No Take 1 tablet (20 mEq total) by mouth daily. Viola Greulich, MD Taking Active   potassium chloride  SA (KLOR-CON  M) 20 MEQ tablet [id number] No TAKE 1/2 TABLET BY MOUTH DAILY Viola Greulich, MD Taking Active   SYNJARDY XR 25-1000 MG TB24 [id number] No Take 1 tablet by mouth every morning. [provider] Taking Active               Assessment/Plan:   Diabetes: - Currently uncontrolled - Reviewed long term cardiovascular and renal outcomes of uncontrolled blood sugar - Reviewed goal A1c, goal fasting, and goal 2 hour post prandial glucose - Reviewed dietary modifications including low carb diet - Reviewed lifestyle modifications including: exercise, patient started back walking today, encouraged to continue to do so - Recommend to continue current medication therapy but reach out to endo office to notify of continued elevated readings to discuss potential med changes  - Patient denies personal or family history of multiple endocrine neoplasia type  2, medullary thyroid  cancer; personal history of pancreatitis or gallbladder disease.    Follow Up Plan: 2 months  Carnell Christian, PharmD Clinical Pharmacist [phone]

## 2024-03-25 ENCOUNTER — Telehealth: Payer: Self-pay

## 2024-03-25 NOTE — Telephone Encounter (Signed)
 Okay for cleaning.

## 2024-03-25 NOTE — Telephone Encounter (Signed)
 Done

## 2024-03-25 NOTE — Telephone Encounter (Signed)
 Copied from CRM [id number]. Topic: General - Other >> Mar 25, 2024 10:57 AM [place] E wrote: Reason for CRM: Patient's wife, Orelia Binet, called in stating that the patient has a deep cleaning as his dental appointment tomorrow and dentist needs approval from PCP for this. Wife questioning if PCP can call dental ([facility]) office at [phone] to give approval.

## 2024-04-10 ENCOUNTER — Other Ambulatory Visit: Payer: Self-pay | Admitting: Family Medicine

## 2024-04-10 DIAGNOSIS — E1149 Type 2 diabetes mellitus with other diabetic neurological complication: Secondary | ICD-10-CM

## 2024-04-18 ENCOUNTER — Ambulatory Visit (INDEPENDENT_AMBULATORY_CARE_PROVIDER_SITE_OTHER): Admitting: Family Medicine

## 2024-04-18 ENCOUNTER — Encounter: Payer: Self-pay | Admitting: Family Medicine

## 2024-04-18 VITALS — BP 130/80 | HR 61 | Temp 98.1°F | Wt 226.0 lb

## 2024-04-18 DIAGNOSIS — B009 Herpesviral infection, unspecified: Secondary | ICD-10-CM

## 2024-04-18 MED ORDER — VALACYCLOVIR HCL 1 G PO TABS: 1000.0000 mg | ORAL_TABLET | Freq: Two times a day (BID) | ORAL | 0 refills | Status: AC

## 2024-04-18 NOTE — Progress Notes (Signed)
   Subjective:    Patient ID: John Hobbs, male    DOB: 24-May-1946, 78 y.o.   MRN: [id number]  HPI Here for 5 days of a painful rash on the bottom of his right foot. He had a similar rash in the same location about 5 years ago, and it went away on its own over a 2 week period.    Review of Systems  Constitutional: Negative.   Respiratory: Negative.    Cardiovascular: Negative.   Skin:  Positive for rash.       Objective:   Physical Exam Constitutional:      Appearance: Normal appearance.  Cardiovascular:     Rate and Rhythm: Normal rate and regular rhythm.     Pulses: Normal pulses.     Heart sounds: Normal heart sounds.  Pulmonary:     Effort: Pulmonary effort is normal.     Breath sounds: Normal breath sounds.  Skin:    Comments: The sole of his right foot is tender and has 2 vesicles present. No erythema or warmth.   Neurological:     Mental Status: He is alert.           Assessment & Plan:  Herpes simplex. Treat with 10 days of Valtrex . Follow up as needed.  Garnette Olmsted, MD

## 2024-04-22 ENCOUNTER — Other Ambulatory Visit: Payer: Self-pay | Admitting: Family Medicine

## 2024-04-22 ENCOUNTER — Ambulatory Visit: Admitting: Family Medicine

## 2024-04-22 DIAGNOSIS — E876 Hypokalemia: Secondary | ICD-10-CM

## 2024-04-29 ENCOUNTER — Encounter: Payer: Self-pay | Admitting: Family Medicine

## 2024-04-29 ENCOUNTER — Ambulatory Visit (INDEPENDENT_AMBULATORY_CARE_PROVIDER_SITE_OTHER): Admitting: Family Medicine

## 2024-04-29 VITALS — BP 126/80 | HR 81 | Temp 98.2°F | Ht 70.0 in | Wt 232.8 lb

## 2024-04-29 DIAGNOSIS — I10 Essential (primary) hypertension: Secondary | ICD-10-CM | POA: Diagnosis not present

## 2024-04-29 DIAGNOSIS — E1140 Type 2 diabetes mellitus with diabetic neuropathy, unspecified: Secondary | ICD-10-CM | POA: Diagnosis not present

## 2024-04-29 DIAGNOSIS — Z794 Long term (current) use of insulin: Secondary | ICD-10-CM

## 2024-04-29 DIAGNOSIS — E782 Mixed hyperlipidemia: Secondary | ICD-10-CM | POA: Diagnosis not present

## 2024-04-29 LAB — GLUCOSE, POCT (MANUAL RESULT ENTRY): POC Glucose: 166 mg/dL — AB (ref 70–99)

## 2024-04-29 NOTE — Patient Instructions (Signed)
 Your blood sugar reading this visit was 166.  Is important that you start making changes to your diet and increase your physical activity to lose weight.    Do not forget to schedule an eye exam to screen for diabetic retinopathy.
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
## 2024-04-30 ENCOUNTER — Other Ambulatory Visit: Payer: Self-pay | Admitting: Family Medicine

## 2024-04-30 DIAGNOSIS — E1149 Type 2 diabetes mellitus with other diabetic neurological complication: Secondary | ICD-10-CM

## 2024-05-07 DIAGNOSIS — E1165 Type 2 diabetes mellitus with hyperglycemia: Secondary | ICD-10-CM | POA: Diagnosis not present

## 2024-05-07 LAB — HEMOGLOBIN A1C: Hemoglobin A1C: 9.1

## 2024-05-13 ENCOUNTER — Encounter: Payer: Self-pay | Admitting: Family Medicine

## 2024-05-13 ENCOUNTER — Telehealth: Payer: Self-pay | Admitting: *Deleted

## 2024-05-13 NOTE — Telephone Encounter (Signed)
 Patient was identified as falling into the [place] Measure - Diabetes.   Patient was: Appointment already scheduled for:  Dr Tommas 05/14/24.

## 2024-05-14 DIAGNOSIS — I10 Essential (primary) hypertension: Secondary | ICD-10-CM | POA: Diagnosis not present

## 2024-05-14 DIAGNOSIS — E7800 Pure hypercholesterolemia, unspecified: Secondary | ICD-10-CM | POA: Diagnosis not present

## 2024-05-14 DIAGNOSIS — E162 Hypoglycemia, unspecified: Secondary | ICD-10-CM | POA: Diagnosis not present

## 2024-05-14 DIAGNOSIS — E1165 Type 2 diabetes mellitus with hyperglycemia: Secondary | ICD-10-CM | POA: Diagnosis not present

## 2024-05-30 ENCOUNTER — Other Ambulatory Visit: Payer: Self-pay | Admitting: Family Medicine

## 2024-05-30 DIAGNOSIS — E1149 Type 2 diabetes mellitus with other diabetic neurological complication: Secondary | ICD-10-CM

## 2024-06-11 DIAGNOSIS — I10 Essential (primary) hypertension: Secondary | ICD-10-CM | POA: Diagnosis not present

## 2024-06-11 DIAGNOSIS — E162 Hypoglycemia, unspecified: Secondary | ICD-10-CM | POA: Diagnosis not present

## 2024-06-11 DIAGNOSIS — E7800 Pure hypercholesterolemia, unspecified: Secondary | ICD-10-CM | POA: Diagnosis not present

## 2024-06-11 DIAGNOSIS — E1165 Type 2 diabetes mellitus with hyperglycemia: Secondary | ICD-10-CM | POA: Diagnosis not present

## 2024-06-12 ENCOUNTER — Other Ambulatory Visit

## 2024-06-14 ENCOUNTER — Other Ambulatory Visit (INDEPENDENT_AMBULATORY_CARE_PROVIDER_SITE_OTHER)

## 2024-06-14 DIAGNOSIS — E1149 Type 2 diabetes mellitus with other diabetic neurological complication: Secondary | ICD-10-CM

## 2024-06-14 NOTE — Progress Notes (Signed)
 06/14/2024 Name: John Hobbs MRN: [id number] DOB: 1946-07-02  Chief Complaint  Patient presents with   Medication Management   Diabetes    John Hobbs is a 78 y.o. year old male who presented for a telephone visit.   They were referred to the pharmacist by a quality report for assistance in managing diabetes (TNM).    Subjective:  Care Team: Primary Care Provider: [name], MD ;   Medication Access/Adherence  Current Pharmacy[facility]  [facility] [phone] - [place], [place] - [address] AT [facility] [address]  [place] [address] Phone: [phone] Fax: [phone]   Patient reports affordability concerns with their medications: No  Patient reports access/transportation concerns to their pharmacy: No  Patient reports adherence concerns with their medications:  No     Diabetes:  Current medications: Glimepiride  2mg  once daily, Synjardy XR 25-1000mg  once daily, Tradjenta 5mg  daily, Tresiba 10 units daily Medications tried in the past: Invokana , Novolin 70/30, Glipizide , Actos , Prandin   Current glucose readings:  Back to using freestyle sensor as of last week. Reports mostly in range readings. Did get one alarm in early evening last week where sugars were in 70s and he felt shakey. Ate and it went up.  Observed patterns:  Patient denies hypoglycemic s/sx including dizziness, shakiness, sweating. Patient denies hyperglycemic symptoms including polyuria, polydipsia, polyphagia, nocturia, neuropathy, blurred vision.  Objective:  Lab Results  Component Value Date   HGBA1C 8.3 01/01/2024    Lab Results  Component Value Date   CREATININE 1.29 03/08/2024   BUN 22 03/08/2024   NA 138 03/08/2024   K 3.5 03/08/2024   CL 98 03/08/2024   CO2 31 03/08/2024    Lab Results  Component Value Date   CHOL 106 10/25/2022   HDL 44 10/25/2022   LDLCALC 49 10/25/2022   TRIG 60 10/25/2022   CHOLHDL 2.4 10/25/2022     Medications Reviewed Today     Reviewed by [name], RPH (Pharmacist) on 06/14/24 at 0849  Med List Status: <None>   Medication Order Taking? Sig Documenting Provider Last Dose Status Informant  acetaminophen  (TYLENOL ) 500 MG tablet [id number] Yes Take 1,000 mg by mouth daily. [provider]  Active Self  amLODipine  (NORVASC ) 5 MG tablet [id number] Yes TAKE 1 TABLET (5 MG TOTAL) BY MOUTH DAILY. [name], MD  Active   aspirin  EC 81 MG tablet [id number] Yes Take 81 mg by mouth daily after breakfast. [provider]  Active Self  atenolol -chlorthalidone  (TENORETIC ) 100-25 MG tablet [id number] Yes TAKE 1/2 TABLET BY MOUTH EVRYDAY [name], MD  Active   atorvastatin  (LIPITOR) 20 MG tablet [id number] Yes TAKE 1 TABLET BY MOUTH EVERY DAY [name], MD  Active   Continuous Glucose Sensor (FREESTYLE LIBRE 3 SENSOR) [place] [id number]  USE TO CHECK GLUCOSE CONTINUOUSLY, CHANGE EVERY 14 DAYS AS DIRECTED [name], MD  Active   fexofenadine  (ALLEGRA ) 180 MG tablet [id number] Yes Take 1 tablet (180 mg total) by mouth daily. [name], MD  Active Self  glimepiride  (AMARYL ) 2 MG tablet [id number] Yes TAKE 1 TABLET BY MOUTH DAILY WITH BREAKFAST [name], MD  Active            Med Note [name]   Fri Jun 14, 2024  8:32 AM)    glucose blood [facility] VERIO) test strip [id number]  Use as instructed [name], MD  Active   glucose blood  test strip [id number]  SMARTSIG:Via Meter As Directed [provider]  Active   glucose blood test strip [id number]   [provider]  Active   Insulin  Pen Needle (BD PEN NEEDLE NANO U/F) 32G X 4 MM MISC [id number]  Use with insulin  pen up to 2 times daily. [name], MD  Active   isosorbide  mononitrate (IMDUR ) 30 MG 24 hr tablet [id number] Yes TAKE ONE HALF TABLET BY MOUTH DAILY Verlin Lonni BIRCH, MD  Active   Lancets [facility] ULTRASOFT) lancets [id number]  Use to check  blood sugar once daily [name], MD  Active Self  linagliptin (TRADJENTA) 5 MG TABS tablet [id number] Yes Take 5 mg by mouth daily. [provider]  Active Self  losartan  (COZAAR ) 50 MG tablet [id number] Yes TAKE 1 TAB BY MOUTH DAILY [name], MD  Active   meclizine  (ANTIVERT ) 25 MG tablet [id number] Yes Take 1 tablet (25 mg total) by mouth 3 (three) times daily as needed for dizziness. Merna Huxley, NP  Active   ONE TOUCH LANCETS MISC [id number]  Test once day [name]  Active Self  Potassium Chloride  ER 20 MEQ TBCR [id number]  TAKE 1 TABLET BY MOUTH EVERY DAY  Patient not taking: Reported on 06/14/2024   [name], MD  Active   potassium chloride  SA (KLOR-CON  M) 20 MEQ tablet [id number] Yes TAKE 1/2 TABLET BY MOUTH DAILY [name], MD  Active   SYNJARDY XR 25-1000 MG TB24 [id number] Yes Take 1 tablet by mouth every morning. [provider]  Active   TRESIBA FLEXTOUCH 100 UNIT/ML FlexTouch Pen [id number] Yes Inject 10 Units into the skin daily. [provider]  Active               Assessment/Plan:   Diabetes: - Currently uncontrolled - Reviewed long term cardiovascular and renal outcomes of uncontrolled blood sugar - Reviewed goal A1c, goal fasting, and goal 2 hour post prandial glucose - Reviewed dietary modifications including low carb diet - Recommend to continue current medication therapy.  - Patient denies personal or family history of multiple endocrine neoplasia type 2, medullary thyroid  cancer; personal history of pancreatitis or gallbladder disease.    Follow Up Plan: 1 month  [name], PharmD Clinical Pharmacist [phone]

## 2024-06-19 ENCOUNTER — Emergency Department (HOSPITAL_COMMUNITY)
Admission: EM | Admit: 2024-06-19 | Discharge: 2024-06-19 | Disposition: A | Discharge status: Home / Self Care | Attending: Emergency Medicine | Admitting: Emergency Medicine

## 2024-06-19 ENCOUNTER — Other Ambulatory Visit: Payer: Self-pay

## 2024-06-19 DIAGNOSIS — E119 Type 2 diabetes mellitus without complications: Secondary | ICD-10-CM | POA: Insufficient documentation

## 2024-06-19 DIAGNOSIS — Z7982 Long term (current) use of aspirin: Secondary | ICD-10-CM | POA: Insufficient documentation

## 2024-06-19 DIAGNOSIS — Z7984 Long term (current) use of oral hypoglycemic drugs: Secondary | ICD-10-CM | POA: Diagnosis not present

## 2024-06-19 DIAGNOSIS — I2510 Atherosclerotic heart disease of native coronary artery without angina pectoris: Secondary | ICD-10-CM | POA: Diagnosis not present

## 2024-06-19 DIAGNOSIS — Z79899 Other long term (current) drug therapy: Secondary | ICD-10-CM | POA: Insufficient documentation

## 2024-06-19 DIAGNOSIS — R21 Rash and other nonspecific skin eruption: Secondary | ICD-10-CM | POA: Diagnosis not present

## 2024-06-19 DIAGNOSIS — Z794 Long term (current) use of insulin: Secondary | ICD-10-CM | POA: Insufficient documentation

## 2024-06-19 DIAGNOSIS — I10 Essential (primary) hypertension: Secondary | ICD-10-CM | POA: Insufficient documentation

## 2024-06-19 LAB — CBG MONITORING, ED: Glucose-Capillary: 201 mg/dL — ABNORMAL HIGH (ref 70–99)

## 2024-06-19 MED ORDER — HYDROCORTISONE 2.5 % EX LOTN: TOPICAL_LOTION | Freq: Two times a day (BID) | CUTANEOUS | 0 refills | Status: AC

## 2024-06-19 MED ORDER — CETIRIZINE HCL 10 MG PO TABS: 10.0000 mg | ORAL_TABLET | Freq: Every day | ORAL | 0 refills | Status: AC
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
## 2024-06-19 NOTE — Discharge Instructions (Addendum)
 Thank you for allowing us  to care for you today. You were evaluated for an acute itchy rash (hives), most consistent with an allergic reaction likely related to food. You were treated in the emergency department and are now stable for discharge.  Medications:  -Take cetirizine  (Zyrtec ) 10 mg once daily for itch and rash. - Apply hydrocortisone  2.5% cream in a thin layer to the affected areas up to 2-3 times daily for itching. Avoid broken skin. - Continue your regular home medications (insulin , amlodipine , atenolol , atorvastatin ) as prescribed.  What to Avoid:  Avoid the suspected food trigger (fish and potatoes with spice) until you follow up with your doctor or allergist. Avoid scratching to prevent irritation or infection.  Monitoring:  Seek immediate medical attention if you develop: Swelling of lips, tongue, or throat Difficulty breathing or swallowing Severe abdominal pain, vomiting, or fainting  Follow-up:  Schedule follow-up with your primary care provider within the week. Consider referral to an allergist for further evaluation and testing.  It was a pleasure caring for you today. Please return promptly if your symptoms worsen or new concerns arise.

## 2024-06-19 NOTE — ED Notes (Signed)
 Pt c/o rash to upper trunk and arms. Pt states that  it is very itchy and hurts a little bit. Small red raised bumps noted upon inspection.

## 2024-06-19 NOTE — ED Triage Notes (Signed)
 Pt reports a rash all over his body that started last night after dinner. Pt states PCP started him on insuline 3 weeks ago, but other than that he is not using any new medication, no new detergent, soap or food. Pt describe the rash as itchy and burning sensation.

## 2024-06-27 ENCOUNTER — Other Ambulatory Visit: Payer: Self-pay | Admitting: Family Medicine

## 2024-06-27 DIAGNOSIS — E1149 Type 2 diabetes mellitus with other diabetic neurological complication: Secondary | ICD-10-CM

## 2024-06-28 ENCOUNTER — Other Ambulatory Visit: Payer: Self-pay

## 2024-06-28 MED ORDER — ISOSORBIDE MONONITRATE ER 30 MG PO TB24: 15.0000 mg | ORAL_TABLET | Freq: Every day | ORAL | 3 refills | Status: AC

## 2024-07-01 ENCOUNTER — Telehealth: Payer: Self-pay | Admitting: Pharmacy Technician

## 2024-07-01 ENCOUNTER — Ambulatory Visit: Payer: Self-pay

## 2024-07-01 NOTE — Telephone Encounter (Signed)
 Patient has an appt 9/18

## 2024-07-01 NOTE — Telephone Encounter (Signed)
 FYI Only or Action Required?: Action required by provider: request for appointment.  Patient was last seen in primary care on 04/29/2024 by [name], MD.  Called Nurse Triage reporting Back Pain.  Symptoms began several months ago.  Interventions attempted: Other: not sure.  Symptoms are: unchanged.  Triage Disposition: See PCP When Office is Open (Within 3 Days)  Patient/caregiver understands and will follow disposition?: YesCopied from CRM [id number]. Topic: Clinical - Red Word Triage >> Jul 01, 2024 11:25 AM Dedra NOVAK wrote: [facility] that prompted transfer to Nurse Triage: Pt granddaughter, [name], calling for pt. Pt is having back pain and rates it as a 8/10. Warm transfer to nurse triage. Reason for Disposition  [1] MODERATE back pain (e.g., interferes with normal activities) AND [2] present > 3 days  Answer Assessment - Initial Assessment Questions Granddaughter rosina called in regards to ongoing back pain. He's not with me. I don't know much about this. I was just calling to make him appt. He has lower back pain that's been getting worse. [name] only wanted to schedule with PCP for thursday.  Protocols used: Back Pain-A-AH

## 2024-07-01 NOTE — Progress Notes (Signed)
   07/01/2024  Patient ID: John Hobbs, male   DOB: 10-Sep-1946, 78 y.o.   MRN: [id number]  Patient engaged with clinical pharmacist for management of diabetes on 06/14/2024. Outreach by [employer] technician was requested.   Outreached patient to discuss diabetes medication management. Patient was not home at the time of the call. Spoke to patient's wife Elveria and granddaughter [name] and left name and number for patient to return my call at their convenience.   Atara Paterson, CPhT [facility] Office: [phone] Email: [email or web address]

## 2024-07-04 ENCOUNTER — Encounter: Payer: Self-pay | Admitting: Family Medicine

## 2024-07-04 ENCOUNTER — Telehealth: Payer: Self-pay | Admitting: Pharmacy Technician

## 2024-07-04 ENCOUNTER — Ambulatory Visit (INDEPENDENT_AMBULATORY_CARE_PROVIDER_SITE_OTHER): Admitting: Family Medicine

## 2024-07-04 VITALS — BP 110/72 | HR 76 | Temp 98.3°F | Ht 70.0 in | Wt 233.4 lb

## 2024-07-04 DIAGNOSIS — M25561 Pain in right knee: Secondary | ICD-10-CM | POA: Diagnosis not present

## 2024-07-04 DIAGNOSIS — M25562 Pain in left knee: Secondary | ICD-10-CM | POA: Diagnosis not present

## 2024-07-04 DIAGNOSIS — E1140 Type 2 diabetes mellitus with diabetic neuropathy, unspecified: Secondary | ICD-10-CM | POA: Diagnosis not present

## 2024-07-04 DIAGNOSIS — M545 Low back pain, unspecified: Secondary | ICD-10-CM

## 2024-07-04 DIAGNOSIS — Z794 Long term (current) use of insulin: Secondary | ICD-10-CM | POA: Diagnosis not present

## 2024-07-04 DIAGNOSIS — H9193 Unspecified hearing loss, bilateral: Secondary | ICD-10-CM | POA: Diagnosis not present

## 2024-07-04 DIAGNOSIS — G8929 Other chronic pain: Secondary | ICD-10-CM | POA: Diagnosis not present

## 2024-07-04 LAB — POCT URINALYSIS DIPSTICK
Bilirubin, UA: NEGATIVE
Blood, UA: NEGATIVE
Glucose, UA: POSITIVE — AB
Ketones, UA: NEGATIVE
Leukocytes, UA: NEGATIVE
Nitrite, UA: NEGATIVE
Protein, UA: NEGATIVE
Spec Grav, UA: 1.015 (ref 1.010–1.025)
Urobilinogen, UA: 0.2 U/dL
pH, UA: 6 (ref 5.0–8.0)
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
## 2024-07-04 NOTE — Patient Instructions (Addendum)
 You urine was negative for infection.  We will plan to get the xray of your lower back on Next Friday 07/12/2024 here in clinic.  If you decide you would like to have it done another day we can always place the order for different clinic.  Our x-ray tech is currently only here on Fridays.  Based on results of imaging we can consider sending you to physical therapy to help with your back.  You can take 2 Tylenol  up to 3 times a day if needed.  You can also use heat, massage, stretching to help with your symptoms.  No TTP of cervical, thoracic, lumbar, or paraspinal muscles.  No TTP no  Do not forget you can find Voltaren  gel over-the-counter at your local pharmacy.  It comes with the dosing card that should be used when applying it to your knees.  It is for arthritis and joints such as knees, hips, shoulders.  Work on diet changes to help lower your A1c as it was recently 9.1% at your endocrinology visit.

## 2024-07-04 NOTE — Progress Notes (Signed)
   07/04/2024  Patient ID: John Hobbs Fabry, male   DOB: 23-May-1946, 78 y.o.   MRN: [id number]  Patient engaged with clinical pharmacist for management of diabetes on 06/14/2024. Outreach by [employer] technician was requested.   Outreached patient to discuss diabetes medication management. Spoke to patient's wife  and left message for patient to return my call at their convenience.   Patient has PCP appointment today and PharmD appointment on 9/24.  Meenakshi Sazama, CPhT [facility] Office: [phone] Email: [email or web address]

## 2024-07-10 ENCOUNTER — Ambulatory Visit

## 2024-07-12 ENCOUNTER — Ambulatory Visit (INDEPENDENT_AMBULATORY_CARE_PROVIDER_SITE_OTHER)

## 2024-07-12 ENCOUNTER — Other Ambulatory Visit

## 2024-07-12 ENCOUNTER — Ambulatory Visit: Payer: Self-pay | Admitting: Family Medicine

## 2024-07-12 DIAGNOSIS — M545 Low back pain, unspecified: Secondary | ICD-10-CM | POA: Diagnosis not present

## 2024-07-12 DIAGNOSIS — M47816 Spondylosis without myelopathy or radiculopathy, lumbar region: Secondary | ICD-10-CM | POA: Diagnosis not present

## 2024-07-12 DIAGNOSIS — G8929 Other chronic pain: Secondary | ICD-10-CM

## 2024-07-17 ENCOUNTER — Ambulatory Visit

## 2024-07-17 DIAGNOSIS — E1140 Type 2 diabetes mellitus with diabetic neuropathy, unspecified: Secondary | ICD-10-CM

## 2024-07-17 NOTE — Progress Notes (Signed)
 07/17/2024 Name: John Hobbs MRN: [id number] DOB: [birth year]  Chief Complaint  Patient presents with   Medication Management   Diabetes    John Hobbs is a 78 y.o. year old male who presented for an office visit   They were referred to the pharmacist by a quality report for assistance in managing diabetes (TNM).    Subjective:  Care Team: Primary Care Provider: [name], MD ;   Medication Access/Adherence  Current Pharmacy:  [facility] [phone] - [place],  - [address] AT [facility] [address] [place] [address] Phone: [phone] Fax: [phone]   Patient reports affordability concerns with their medications: No  Patient reports access/transportation concerns to their pharmacy: No  Patient reports adherence concerns with their medications:  No     Diabetes:  Current medications: Glimepiride  2mg  once daily, Synjardy XR 25-1000mg  once daily, Tradjenta 5mg  daily, Tresiba 10 units daily Medications tried in the past: Invokana , Novolin 70/30, Glipizide , Actos , Prandin   Current glucose readings:  Back to using freestyle sensor as of last week.   7 day glucose avg: 165 14 day glucose avg: 171 30 day glucose avg: 172 90 day glucose avg: 165  Has been out of insulin  5 days but plans to pick up today  Observed patterns:  Patient denies hypoglycemic s/sx including dizziness, shakiness, sweating. Patient denies hyperglycemic symptoms including polyuria, polydipsia, polyphagia, nocturia, neuropathy, blurred vision.  Objective:  Lab Results  Component Value Date   HGBA1C 9.1 05/07/2024    Lab Results  Component Value Date   CREATININE 1.29 03/08/2024   BUN 22 03/08/2024   NA 138 03/08/2024   K 3.5 03/08/2024   CL 98 03/08/2024   CO2 31 03/08/2024    Lab Results  Component Value Date   CHOL 106 10/25/2022   HDL 44 10/25/2022   LDLCALC 49 10/25/2022   TRIG 60 10/25/2022   CHOLHDL 2.4  10/25/2022    Medications Reviewed Today     Reviewed by [name], RPH (Pharmacist) on 07/17/24 at 1443  Med List Status: <None>   Medication Order Taking? Sig Documenting Provider Last Dose Status Informant  acetaminophen  (TYLENOL ) 500 MG tablet [id number] Yes Take 1,000 mg by mouth daily. [provider]  Active Self  amLODipine  (NORVASC ) 5 MG tablet [id number] Yes TAKE 1 TABLET (5 MG TOTAL) BY MOUTH DAILY. [name], MD  Active   aspirin  EC 81 MG tablet [id number] Yes Take 81 mg by mouth daily after breakfast. [provider]  Active Self  atenolol -chlorthalidone  (TENORETIC ) 100-25 MG tablet [id number] Yes TAKE 1/2 TABLET BY MOUTH EVRYDAY [name], MD  Active   atorvastatin  (LIPITOR) 20 MG tablet [id number] Yes TAKE 1 TABLET BY MOUTH EVERY DAY [name], MD  Active   cetirizine  (ZYRTEC  ALLERGY) 10 MG tablet [id number] Yes Take 1 tablet (10 mg total) by mouth daily. Azadegan, Maryam, MD  Active   Continuous Glucose Sensor (FREESTYLE LIBRE 3 SENSOR) [place] [id number]  USE TO CHECK GLUCOSE CONTINUOUSLY, CHANGE EVERY 14 DAYS AS DIRECTED [name], MD  Active   fexofenadine  (ALLEGRA ) 180 MG tablet [id number] Yes Take 1 tablet (180 mg total) by mouth daily. [name], MD  Active Self  glimepiride  (AMARYL ) 2 MG tablet [id number] Yes TAKE 1 TABLET BY MOUTH DAILY WITH BREAKFAST Mercer, Clotilda SAUNDERS, MD  Active            Med Note (Jamesa Tedrick H  Fri Jun 14, 2024  8:32 AM)    glucose blood [facility] VERIO) test strip [id number]  Use as instructed [name], MD  Active   glucose blood test strip [id number]  SMARTSIG:Via Meter As Directed [provider]  Active   glucose blood test strip [id number]   [provider]  Active   hydrocortisone  2.5 % lotion [id number]  Apply topically 2 (two) times daily. Azadegan, Maryam, MD  Active   Insulin  Pen Needle (BD PEN NEEDLE NANO U/F) 32G X 4 MM MISC [id number]  Use with insulin  pen up to  2 times daily. [name], MD  Active   isosorbide  mononitrate (IMDUR ) 30 MG 24 hr tablet [id number] Yes Take 0.5 tablets (15 mg total) by mouth daily. Swinyer, Rosaline HERO, NP  Active   Lancets [facility] ULTRASOFT) lancets [id number]  Use to check blood sugar once daily [name], MD  Active Self  linagliptin (TRADJENTA) 5 MG TABS tablet [id number] Yes Take 5 mg by mouth daily. [provider]  Active Self  losartan  (COZAAR ) 50 MG tablet [id number] Yes TAKE 1 TAB BY MOUTH DAILY [name], MD  Active   meclizine  (ANTIVERT ) 25 MG tablet [id number]  Take 1 tablet (25 mg total) by mouth 3 (three) times daily as needed for dizziness. Merna Huxley, NP  Active   ONE TOUCH LANCETS MISC [id number]  Test once day [name]  Active Self  Potassium Chloride  ER 20 MEQ TBCR [id number] Yes TAKE 1 TABLET BY MOUTH EVERY DAY [name], MD  Active   potassium chloride  SA (KLOR-CON  M) 20 MEQ tablet [id number]  TAKE 1/2 TABLET BY MOUTH DAILY [name], MD  Active   SYNJARDY XR 25-1000 MG TB24 [id number] Yes Take 1 tablet by mouth every morning. [provider]  Active   TRESIBA FLEXTOUCH 100 UNIT/ML FlexTouch Pen [id number] Yes Inject 10 Units into the skin daily. [provider]  Active               Assessment/Plan:   Diabetes: - Currently uncontrolled but improving - Reviewed long term cardiovascular and renal outcomes of uncontrolled blood sugar - Reviewed goal A1c, goal fasting, and goal 2 hour post prandial glucose - Reviewed dietary modifications including low carb diet - Recommend to continue current medication therapy. Consider insulin  dose increase if A1c still elevated at next endo appt in Dec - Patient denies personal or family history of multiple endocrine neoplasia type 2, medullary thyroid  cancer; personal history of pancreatitis or gallbladder disease.    Follow Up Plan: 6 weeks  [name], PharmD Clinical  Pharmacist [phone]

## 2024-07-22 ENCOUNTER — Telehealth: Payer: Self-pay | Admitting: Pharmacy Technician

## 2024-07-22 NOTE — Progress Notes (Signed)
   07/22/2024 Name: John Hobbs MRN: [id number] DOB: [birth year]  Patient is appearing on a report for True [place] Metric Diabetes and last engaged with the clinical pharmacist to discuss diabetes on 07/17/2024. Contacted patient today to discuss diabetes management and completed medication review.   Diabetes Plan from last clinical pharmacist appointment:  Diabetes: - Currently uncontrolled but improving - Reviewed long term cardiovascular and renal outcomes of uncontrolled blood sugar - Reviewed goal A1c, goal fasting, and goal 2 hour post prandial glucose - Reviewed dietary modifications including low carb diet - Recommend to continue current medication therapy. Consider insulin  dose increase if A1c still elevated at next endo appt in Dec - Patient denies personal or family history of multiple endocrine neoplasia type 2, medullary thyroid  cancer; personal history of pancreatitis or gallbladder disease.  Follow Up Plan: 6 weeks  Medication Adherence Barriers Identified:  Patient made recommended medication changes per plan: Yes Spoke to patient's wife. She informs patient was able to pick up his insulin  and start it back. She informs he takes [place] 10 units daily and he takes Glimepiride  2mg  daily, Tradjenta 5mg  daily and Synjardy XR 25-1000mg  daily. Access issues with any new medication or testing device: No Patient's wife informs at first the pharmacy told patient that his insulin  would be around $400 but she informs she called back and had them bill the medication to his insurance and the copay was zero. Per Dr Annemarie fill history data, 3 tab lets of Tradjenta and Synjardy were sold on 9/17 and 10/2 respectively. Glimepiride  was sold for 90 tablets on 7/21 and Tresiba was sold for 15ml or 90 day supply on 10/2. No cost concerns were reported. Patient is checking blood sugars as prescribed: Yes Patient's wife informs his blood sugars are doing good. She reports they average between 150-175.  She reports no blood sugars over 200. A!C was 9.1 on 05/07/2024.  Medication Adherence Barriers Addressed/Actions Taken:  Reviewed medication changes per plan from last clinical pharmacist note Educated patient to contact pharmacy regarding new prescriptions and refills. Reviewed instructions for monitoring blood sugars at home and reminded patient to keep a written log to review with pharmacist Reminded patient of date/time of upcoming clinical pharmacist follow up and any upcoming PCP/specialists visits. Patient denies transportation barriers to the appointment. Yes  Next clinical pharmacist appointment is scheduled for: 08/28/2024  Tatelyn Vanhecke, CPhT [facility] Office: [phone] Email: [email or web address]

## 2024-08-01 ENCOUNTER — Other Ambulatory Visit: Payer: Self-pay | Admitting: Family Medicine

## 2024-08-01 DIAGNOSIS — I10 Essential (primary) hypertension: Secondary | ICD-10-CM

## 2024-08-06 ENCOUNTER — Telehealth: Payer: Self-pay

## 2024-08-06 NOTE — Telephone Encounter (Signed)
 Patient was identified as falling into the [place] Measure - Diabetes.   Patient was: Appointment scheduled for lab or office visit for A1c.  Appointment 08/28/2024 at 1430.

## 2024-08-15 ENCOUNTER — Ambulatory Visit: Payer: Self-pay

## 2024-08-15 NOTE — Telephone Encounter (Signed)
Appt 10/31

## 2024-08-15 NOTE — Telephone Encounter (Signed)
 FYI Only or Action Required?: FYI only for provider: appointment scheduled on 08/16/24.  Patient was last seen in primary care on 07/04/2024 by [name], MD.  Called Nurse Triage reporting Fatigue.  Symptoms began several days ago.  Interventions attempted: Nothing.  Symptoms are: unchanged.  Triage Disposition: See PCP When Office is Open (Within 3 Days)  Patient/caregiver understands and will follow disposition?: Yes  Copied from CRM #[id number]. Topic: Clinical - Red Word Triage >> Aug 15, 2024  1:04 PM Larissa RAMAN wrote: [facility] that prompted transfer to Nurse Triage: Anxiety Reason for Disposition  Nursing judgment or information in reference  Answer Assessment - Initial Assessment Questions Patient's wife stated repeatedly that her husband just does not feel good Denies any specific complaint  1. REASON FOR CALL: What is your main concern right now?     Wife is caller.  States that her husband does not feel good and that he is mopey 2. ONSET: When did the symptoms start?     2-3 days ago  4. FUNCTIONAL IMPAIRMENT: How have things been going for you overall? Have you had more difficulty than usual doing your normal daily activities? (e.g., self-care, school, work, interactions)     No functional impairment 5. RELIEVING AND AGGRAVATING FACTORS: What makes it better or worse? (e.g., certain activities, rest)     Wife states that she does not know  Denies all symptom of self harm, chest pain, SOB, dizziness  Protocols used: No Guideline Available-A-AH

## 2024-08-16 ENCOUNTER — Ambulatory Visit: Admitting: Family Medicine

## 2024-08-16 ENCOUNTER — Encounter: Payer: Self-pay | Admitting: Family Medicine

## 2024-08-16 VITALS — BP 96/70 | HR 75 | Temp 97.5°F | Ht 70.0 in | Wt 229.4 lb

## 2024-08-16 DIAGNOSIS — I959 Hypotension, unspecified: Secondary | ICD-10-CM | POA: Diagnosis not present

## 2024-08-16 DIAGNOSIS — E1165 Type 2 diabetes mellitus with hyperglycemia: Secondary | ICD-10-CM | POA: Diagnosis not present

## 2024-08-16 DIAGNOSIS — R300 Dysuria: Secondary | ICD-10-CM | POA: Diagnosis not present

## 2024-08-16 DIAGNOSIS — R6883 Chills (without fever): Secondary | ICD-10-CM | POA: Diagnosis not present

## 2024-08-16 LAB — CBC WITH DIFFERENTIAL/PLATELET
Basophils Absolute: 0 K/uL (ref 0.0–0.1)
Basophils Relative: 0.3 % (ref 0.0–3.0)
Eosinophils Absolute: 0 K/uL (ref 0.0–0.7)
Eosinophils Relative: 0.4 % (ref 0.0–5.0)
HCT: 46.3 % (ref 39.0–52.0)
Hemoglobin: 15.3 g/dL (ref 13.0–17.0)
Lymphocytes Relative: 12.4 % (ref 12.0–46.0)
Lymphs Abs: 1.1 K/uL (ref 0.7–4.0)
MCHC: 33 g/dL (ref 30.0–36.0)
MCV: 87.5 fl (ref 78.0–100.0)
Monocytes Absolute: 0.7 K/uL (ref 0.1–1.0)
Monocytes Relative: 8.3 % (ref 3.0–12.0)
Neutro Abs: 6.8 K/uL (ref 1.4–7.7)
Neutrophils Relative %: 78.6 % — ABNORMAL HIGH (ref 43.0–77.0)
Platelets: 192 K/uL (ref 150.0–400.0)
RBC: 5.29 Mil/uL (ref 4.22–5.81)
RDW: 14.4 % (ref 11.5–15.5)
WBC: 8.7 K/uL (ref 4.0–10.5)

## 2024-08-16 LAB — POC URINALSYSI DIPSTICK (AUTOMATED)
Bilirubin, UA: NEGATIVE
Blood, UA: NEGATIVE
Glucose, UA: NEGATIVE
Ketones, UA: POSITIVE
Leukocytes, UA: NEGATIVE
Nitrite, UA: NEGATIVE
Protein, UA: NEGATIVE
Spec Grav, UA: 1.015
Urobilinogen, UA: 0.2 U/dL
pH, UA: 5

## 2024-08-16 LAB — COMPREHENSIVE METABOLIC PANEL WITH GFR
ALT: 16 U/L (ref 0–53)
AST: 21 U/L (ref 0–37)
Albumin: 4.4 g/dL (ref 3.5–5.2)
Alkaline Phosphatase: 42 U/L (ref 39–117)
BUN: 23 mg/dL (ref 6–23)
CO2: 27 meq/L (ref 19–32)
Calcium: 10.1 mg/dL (ref 8.4–10.5)
Chloride: 96 meq/L (ref 96–112)
Creatinine, Ser: 1.28 mg/dL (ref 0.40–1.50)
GFR: 54.6 mL/min — ABNORMAL LOW (ref >60.00)
Glucose, Bld: 151 mg/dL — ABNORMAL HIGH (ref 70–99)
Potassium: 3.5 meq/L (ref 3.5–5.1)
Sodium: 135 meq/L (ref 135–145)
Total Bilirubin: 1 mg/dL (ref 0.2–1.2)
Total Protein: 8.3 g/dL (ref 6.0–8.3)

## 2024-08-16 LAB — GLUCOSE, POCT (MANUAL RESULT ENTRY): POC Glucose: 178 mg/dL — AB (ref 70–99)

## 2024-08-16 LAB — T4, FREE: Free T4: 0.68 ng/dL (ref 0.60–1.60)

## 2024-08-16 LAB — HEMOGLOBIN A1C: Hgb A1c MFr Bld: 8.7 % — ABNORMAL HIGH (ref 4.6–6.5)

## 2024-08-16 LAB — TSH: TSH: 3.14 u[IU]/mL (ref 0.35–5.50)

## 2024-08-16 MED ORDER — POTASSIUM CHLORIDE CRYS ER 20 MEQ PO TBCR: 20.0000 meq | EXTENDED_RELEASE_TABLET | Freq: Every day | ORAL | 3 refills | Status: DC

## 2024-08-16 MED ORDER — POTASSIUM CHLORIDE CRYS ER 20 MEQ PO TBCR: 20.0000 meq | EXTENDED_RELEASE_TABLET | Freq: Every day | ORAL | 0 refills | Status: DC
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
## 2024-08-16 NOTE — Patient Instructions (Addendum)
 I have sent in a new prescription for potassium.  Instead of taking the half tab of potasium, start taking a whole tab (20 mEq).  I think you have been feeling bad due to your blood sugar being so high and causing diabetic ketoacidosis.  Currently you blood sugar is improving, 176 in clinic.  I have included some information about this.  You will need to stay hydrated.

## 2024-08-21 ENCOUNTER — Ambulatory Visit: Payer: Self-pay | Admitting: Family Medicine

## 2024-08-22 NOTE — Telephone Encounter (Signed)
-----   Message from John Hobbs sent at 08/21/2024  6:09 PM EST ----- Hgb A1C now 8.7% which is slightly lower than prior reading 3 month ago.  Other labs stable.  It is important that you work on your diet as you were likely having symptoms related to the elevation in  your blood sugar. ----- Message ----- From: [name] LABOR, CMA Sent: 08/16/2024  11:18 AM EST To: John JONELLE Single, MD

## 2024-08-22 NOTE — Telephone Encounter (Signed)
 Called pt to relay notes from latest labs. No answer. Unable to leave VM.

## 2024-08-25 ENCOUNTER — Other Ambulatory Visit: Payer: Self-pay | Admitting: Family Medicine

## 2024-08-25 ENCOUNTER — Other Ambulatory Visit: Payer: Self-pay | Admitting: Adult Health

## 2024-08-25 DIAGNOSIS — R42 Dizziness and giddiness: Secondary | ICD-10-CM

## 2024-08-28 ENCOUNTER — Ambulatory Visit

## 2024-08-28 ENCOUNTER — Ambulatory Visit: Admitting: Family Medicine

## 2024-08-28 ENCOUNTER — Encounter: Payer: Self-pay | Admitting: Family Medicine

## 2024-08-28 DIAGNOSIS — E1165 Type 2 diabetes mellitus with hyperglycemia: Secondary | ICD-10-CM

## 2024-08-28 NOTE — Progress Notes (Signed)
 08/28/2024 Name: John Hobbs MRN: [id number] DOB: 1946/01/23  Chief Complaint  Patient presents with   Medication Management   Diabetes    John Hobbs is a 78 y.o. year old male who presented for an office visit   They were referred to the pharmacist by a quality report for assistance in managing diabetes (TNM).    Subjective:  Care Team: Primary Care Provider: [name], MD ;   Medication Access/Adherence  Current Pharmacy:  [phone] [place], [place] - [address] AT [facility] [address]  [place] [address] Phone: [phone] Fax: [phone]   Patient reports affordability concerns with their medications: No  Patient reports access/transportation concerns to their pharmacy: No  Patient reports adherence concerns with their medications:  No     Diabetes:  Current medications: Glimepiride  2mg  once daily, Synjardy XR 25-1000mg  once daily, Tradjenta 5mg  daily, Tresiba 10 units daily Medications tried in the past: Invokana , Novolin 70/30, Glipizide , Actos , Prandin   Current glucose readings:  Has not had sensors on the last 5 days, finger pricks instead. Reporting elevated sugar readings    Observed patterns:  Patient denies hypoglycemic s/sx including dizziness, shakiness, sweating. Patient denies hyperglycemic symptoms including polyuria, polydipsia, polyphagia, nocturia, neuropathy, blurred vision.  Objective:  Lab Results  Component Value Date   HGBA1C 8.7 (H) 08/16/2024    Lab Results  Component Value Date   CREATININE 1.28 08/16/2024   BUN 23 08/16/2024   NA 135 08/16/2024   K 3.5 08/16/2024   CL 96 08/16/2024   CO2 27 08/16/2024    Lab Results  Component Value Date   CHOL 106 10/25/2022   HDL 44 10/25/2022   LDLCALC 49 10/25/2022   TRIG 60 10/25/2022   CHOLHDL 2.4 10/25/2022    Medications Reviewed Today     Reviewed by [name], RPH (Pharmacist) on  08/28/24 at 2041  Med List Status: <None>   Medication Order Taking? Sig Documenting Provider Last Dose Status Informant  acetaminophen  (TYLENOL ) 500 MG tablet [id number] Yes Take 1,000 mg by mouth daily. [provider]  Active Self  amLODipine  (NORVASC ) 5 MG tablet [id number] Yes TAKE 1 TABLET (5 MG TOTAL) BY MOUTH DAILY. [name], MD  Active   aspirin  EC 81 MG tablet [id number] Yes Take 81 mg by mouth daily after breakfast. [provider]  Active Self  atenolol -chlorthalidone  (TENORETIC ) 100-25 MG tablet [id number] Yes TAKE 1/2 TABLET BY MOUTH EVRYDAY [name], MD  Active   atorvastatin  (LIPITOR) 20 MG tablet [id number] Yes TAKE 1 TABLET BY MOUTH EVERY DAY [name], MD  Active   cetirizine  (ZYRTEC  ALLERGY) 10 MG tablet [id number] Yes Take 1 tablet (10 mg total) by mouth daily. Azadegan, Maryam, MD  Active   Continuous Glucose Sensor (FREESTYLE LIBRE 3 SENSOR) [place] [id number]  USE TO CHECK GLUCOSE CONTINUOUSLY, CHANGE EVERY 14 DAYS AS DIRECTED [name], MD  Active   fexofenadine  (ALLEGRA ) 180 MG tablet [id number] Yes Take 1 tablet (180 mg total) by mouth daily. [name], MD  Active Self  glimepiride  (AMARYL ) 2 MG tablet [id number] Yes TAKE 1 TABLET BY MOUTH DAILY WITH BREAKFAST [name], MD  Active            Med Note [name]   Fri Jun 14, 2024  8:32 AM)    glucose blood [facility] VERIO) test strip [id number]  Use as instructed [name], MD  Active   glucose blood test strip [id number]  SMARTSIG:Via Meter As Directed [provider]  Active   glucose blood test strip [id number]   [provider]  Active   hydrocortisone  2.5 % lotion [id number]  Apply topically 2 (two) times daily. Azadegan, Maryam, MD  Active   Insulin  Pen Needle (BD PEN NEEDLE NANO U/F) 32G X 4 MM MISC [id number]  Use with insulin  pen up to 2 times daily. [name], MD  Active   isosorbide  mononitrate (IMDUR ) 30 MG 24 hr tablet  [id number] Yes Take 0.5 tablets (15 mg total) by mouth daily. Swinyer, Rosaline HERO, NP  Active   Lancets [facility] ULTRASOFT) lancets [id number]  Use to check blood sugar once daily [name], MD  Active Self  linagliptin (TRADJENTA) 5 MG TABS tablet [id number] Yes Take 5 mg by mouth daily. [provider]  Active Self  losartan  (COZAAR ) 50 MG tablet [id number] Yes TAKE 1 TAB BY MOUTH DAILY [name], MD  Active   meclizine  (ANTIVERT ) 25 MG tablet [id number] Yes Take 1 tablet (25 mg total) by mouth 3 (three) times daily as needed for dizziness. Merna Huxley, NP  Active   ONE TOUCH LANCETS MISC [id number]  Test once day [name]  Active Self  Potassium Chloride  ER 20 MEQ TBCR [id number] Yes TAKE 1 TABLET BY MOUTH EVERY DAY [name], MD  Active   potassium chloride  SA (KLOR-CON  M) 20 MEQ tablet [id number]  Take 1 tablet (20 mEq total) by mouth daily for 3 days. [name], MD  Expired 08/19/24 2359   SYNJARDY XR 25-1000 MG TB24 [id number] Yes Take 1 tablet by mouth every morning. [provider]  Active   TRESIBA FLEXTOUCH 100 UNIT/ML FlexTouch Pen [id number] Yes Inject 10 Units into the skin daily. [provider]  Active               Assessment/Plan:   Diabetes: - Currently uncontrolled despite reported adherence to medications - Reviewed long term cardiovascular and renal outcomes of uncontrolled blood sugar - Reviewed goal A1c, goal fasting, and goal 2 hour post prandial glucose - Reviewed dietary modifications including low carb diet - Recommend to reach out to endo sooner than scheduled f/u to address elevated BG. Patient will call today. Continue current medication therapy until instructed otherwise by endo - Patient denies personal or family history of multiple endocrine neoplasia type 2, medullary thyroid  cancer; personal history of pancreatitis or gallbladder disease.    Follow Up Plan: 2 weeks [name],  PharmD Clinical Pharmacist [phone]

## 2024-09-09 ENCOUNTER — Other Ambulatory Visit

## 2024-09-09 DIAGNOSIS — E1165 Type 2 diabetes mellitus with hyperglycemia: Secondary | ICD-10-CM

## 2024-09-09 NOTE — Progress Notes (Signed)
 09/09/2024 Name: John Hobbs MRN: [id number] DOB: [birth year]  Chief Complaint  Patient presents with   Medication Management   Diabetes    John Hobbs is a 78 y.o. year old male who presented for an telephone visit.   They were referred to the pharmacist by a quality report for assistance in managing diabetes (TNM).    Subjective:  Care Team: Primary Care Provider: [name], MD ;   Medication Access/Adherence  [facility]  [phone] [place], [place] - [address] AT [facility] [address] [place] [address] Phone: [phone] Fax: [phone]   Patient reports affordability concerns with their medications: No  Patient reports access/transportation concerns to their pharmacy: No  Patient reports adherence concerns with their medications:  No     Diabetes:  Current medications: Glimepiride  2mg  once daily, Synjardy XR 25-1000mg  once daily, Tradjenta 5mg  daily, Tresiba 10 units daily Medications tried in the past: Invokana , Novolin 70/30, Glipizide , Actos , Prandin   Current glucose readings:  Reporting up and down readings. Reports he tried to contact endo but has not heard back yet    Observed patterns:  Patient denies hypoglycemic s/sx including dizziness, shakiness, sweating. Patient denies hyperglycemic symptoms including polyuria, polydipsia, polyphagia, nocturia, neuropathy, blurred vision.  Objective:  Lab Results  Component Value Date   HGBA1C 8.7 (H) 08/16/2024    Lab Results  Component Value Date   CREATININE 1.28 08/16/2024   BUN 23 08/16/2024   NA 135 08/16/2024   K 3.5 08/16/2024   CL 96 08/16/2024   CO2 27 08/16/2024    Lab Results  Component Value Date   CHOL 106 10/25/2022   HDL 44 10/25/2022   LDLCALC 49 10/25/2022   TRIG 60 10/25/2022   CHOLHDL 2.4 10/25/2022    Medications Reviewed Today     Reviewed by John Hobbs, RPH (Pharmacist) on  09/09/24 at 1541  Med List Status: <None>   Medication Order Taking? Sig Documenting Provider Last Dose Status Informant  acetaminophen  (TYLENOL ) 500 MG tablet [id number]  Take 1,000 mg by mouth daily. [provider]  Active Self  amLODipine  (NORVASC ) 5 MG tablet [id number]  TAKE 1 TABLET (5 MG TOTAL) BY MOUTH DAILY. John Clotilda SAUNDERS, MD  Active   aspirin  EC 81 MG tablet [id number]  Take 81 mg by mouth daily after breakfast. [provider]  Active Self  atenolol -chlorthalidone  (TENORETIC ) 100-25 MG tablet [id number]  TAKE 1/2 TABLET BY MOUTH EVRYDAY John Clotilda SAUNDERS, MD  Active   atorvastatin  (LIPITOR) 20 MG tablet [id number]  TAKE 1 TABLET BY MOUTH EVERY DAY John Clotilda SAUNDERS, MD  Active   cetirizine  (ZYRTEC  ALLERGY) 10 MG tablet [id number]  Take 1 tablet (10 mg total) by mouth daily. Azadegan, Maryam, MD  Active   Continuous Glucose Sensor (FREESTYLE LIBRE 3 SENSOR) [place] [id number]  USE TO CHECK GLUCOSE CONTINUOUSLY, CHANGE EVERY 14 DAYS AS DIRECTED John Clotilda SAUNDERS, MD  Active   fexofenadine  (ALLEGRA ) 180 MG tablet [id number]  Take 1 tablet (180 mg total) by mouth daily. John Clotilda SAUNDERS, MD  Active Self  glimepiride  (AMARYL ) 2 MG tablet [id number]  TAKE 1 TABLET BY MOUTH DAILY WITH BREAKFAST John Clotilda SAUNDERS, MD  Active            Med Note John Hobbs   Fri Jun 14, 2024  8:32 AM)    glucose blood [facility] VERIO) test strip [id number]  Use as instructed John Clotilda SAUNDERS,  MD  Active   glucose blood test strip [id number]  SMARTSIG:Via Meter As Directed [provider]  Active   glucose blood test strip [id number]   [provider]  Active   hydrocortisone  2.5 % lotion [id number]  Apply topically 2 (two) times daily. Azadegan, Maryam, MD  Active   Insulin  Pen Needle (BD PEN NEEDLE NANO U/F) 32G X 4 MM MISC [id number]  Use with insulin  pen up to 2 times daily. John Clotilda SAUNDERS, MD  Active   isosorbide  mononitrate (IMDUR ) 30 MG 24 hr tablet [id number]  Take 0.5 tablets  (15 mg total) by mouth daily. Hobbs, John HERO, NP  Active   Lancets [facility] ULTRASOFT) lancets [id number]  Use to check blood sugar once daily John Clotilda SAUNDERS, MD  Active Self  linagliptin (TRADJENTA) 5 MG TABS tablet [id number]  Take 5 mg by mouth daily. [provider]  Active Self  losartan  (COZAAR ) 50 MG tablet [id number]  TAKE 1 TAB BY MOUTH DAILY John Clotilda SAUNDERS, MD  Active   meclizine  (ANTIVERT ) 25 MG tablet [id number]  TAKE 1 TABLET BY MOUTH 3 TIMES DAILY AS NEEDED FOR DIZZINESS. John Clotilda SAUNDERS, MD  Active   ONE [facility] MISC [id number]  Test once day John Hobbs  Active Self  Potassium Chloride  ER 20 MEQ TBCR [id number]  TAKE 1 TABLET BY MOUTH EVERY DAY John Clotilda SAUNDERS, MD  Active   potassium chloride  SA (KLOR-CON  M) 20 MEQ tablet [id number]  Take 1 tablet (20 mEq total) by mouth daily for 3 days. John Clotilda SAUNDERS, MD  Expired 08/19/24 2359   SYNJARDY XR 25-1000 MG TB24 [id number]  Take 1 tablet by mouth every morning. [provider]  Active   TRESIBA FLEXTOUCH 100 UNIT/ML FlexTouch Pen [id number]  Inject 10 Units into the skin daily. [provider]  Active               Assessment/Plan:   Diabetes: - Currently uncontrolled despite reported adherence to medications - Reviewed long term cardiovascular and renal outcomes of uncontrolled blood sugar - Reviewed goal A1c, goal fasting, and goal 2 hour post prandial glucose - Reviewed dietary modifications including low carb diet - Recommend to reach out to endo sooner than scheduled f/u to address elevated BG. Patient will call again today. Continue current medication therapy until instructed otherwise by endo - Patient denies personal or family history of multiple endocrine neoplasia type 2, medullary thyroid  cancer; personal history of pancreatitis or gallbladder disease.    Follow Up Plan: 3 weeks John Hobbs, PharmD Clinical Pharmacist [phone]

## 2024-09-27 ENCOUNTER — Other Ambulatory Visit: Payer: Self-pay | Admitting: Family Medicine

## 2024-09-27 DIAGNOSIS — I10 Essential (primary) hypertension: Secondary | ICD-10-CM

## 2024-09-30 ENCOUNTER — Other Ambulatory Visit

## 2024-09-30 DIAGNOSIS — E1165 Type 2 diabetes mellitus with hyperglycemia: Secondary | ICD-10-CM

## 2024-09-30 NOTE — Progress Notes (Signed)
 09/30/2024 Name: John Hobbs MRN: [id number] DOB: 06-Sep-1946  Chief Complaint  Patient presents with   Medication Management   Diabetes    John Hobbs is a 78 y.o. year old male who presented for an telephone visit.   They were referred to the pharmacist by a quality report for assistance in managing diabetes (TNM).    Subjective:  Care Team: Primary Care Provider: [name], MD ;   Medication Access/Adherence  Current Pharmacy:  [facility] [phone] - [place], [place] - [address] AT [facility] [address] [place] [address] Phone: [phone] Fax: [phone]   Patient reports affordability concerns with their medications: No  Patient reports access/transportation concerns to their pharmacy: No  Patient reports adherence concerns with their medications:  No     Diabetes:  Current medications: Glimepiride  2mg  once daily, Synjardy XR 25-1000mg  once daily, Tradjenta 5mg  daily, Tresiba 10 units daily Medications tried in the past: Invokana , Novolin 70/30, Glipizide , Actos , Prandin   Current glucose readings:  Reporting he kept seeing elevated readings on sensor but felt okay so started pricking finger and noticed big differences in readings, with the finger prick being almost 100 points lower at times  Does not have a sensor on at this time, was considering stopping and just doing finger prick checks  Observed patterns:  Patient denies hypoglycemic s/sx including dizziness, shakiness, sweating. Patient denies hyperglycemic symptoms including polyuria, polydipsia, polyphagia, nocturia, neuropathy, blurred vision.  Objective:  Lab Results  Component Value Date   HGBA1C 8.7 (H) 08/16/2024    Lab Results  Component Value Date   CREATININE 1.28 08/16/2024   BUN 23 08/16/2024   NA 135 08/16/2024   K 3.5 08/16/2024   CL 96 08/16/2024   CO2 27 08/16/2024    Lab Results  Component Value Date   CHOL  106 10/25/2022   HDL 44 10/25/2022   LDLCALC 49 10/25/2022   TRIG 60 10/25/2022   CHOLHDL 2.4 10/25/2022    Medications Reviewed Today     Reviewed by [name], RPH (Pharmacist) on 09/30/24 at 1541  Med List Status: <None>   Medication Order Taking? Sig Documenting Provider Last Dose Status Informant  acetaminophen  (TYLENOL ) 500 MG tablet [id number]  Take 1,000 mg by mouth daily. [provider]  Active Self  amLODipine  (NORVASC ) 5 MG tablet [id number]  TAKE 1 TABLET (5 MG TOTAL) BY MOUTH DAILY. [name], MD  Active   aspirin  EC 81 MG tablet [id number]  Take 81 mg by mouth daily after breakfast. [provider]  Active Self  atenolol -chlorthalidone  (TENORETIC ) 100-25 MG tablet [id number]  TAKE 1/2 TABLET BY MOUTH EVRYDAY [name], MD  Active   atorvastatin  (LIPITOR) 20 MG tablet [id number]  TAKE 1 TABLET BY MOUTH EVERY DAY [name], MD  Active   cetirizine  (ZYRTEC  ALLERGY) 10 MG tablet [id number]  Take 1 tablet (10 mg total) by mouth daily. Azadegan, Maryam, MD  Active   Continuous Glucose Sensor (FREESTYLE LIBRE 3 SENSOR) [place] [id number]  USE TO CHECK GLUCOSE CONTINUOUSLY, CHANGE EVERY 14 DAYS AS DIRECTED [name], MD  Active   fexofenadine  (ALLEGRA ) 180 MG tablet [id number]  Take 1 tablet (180 mg total) by mouth daily. [name], MD  Active Self  glimepiride  (AMARYL ) 2 MG tablet [id number]  TAKE 1 TABLET BY MOUTH DAILY WITH BREAKFAST [name], MD  Active  Med Note JANEAN, JON DEL   Fri Jun 14, 2024  8:32 AM)    glucose blood [facility] VERIO) test strip [id number]  Use as instructed [name], MD  Active   glucose blood test strip [id number]  SMARTSIG:Via Meter As Directed [provider]  Active   glucose blood test strip [id number]   [provider]  Active   hydrocortisone  2.5 % lotion [id number]  Apply topically 2 (two) times daily. Azadegan, Maryam, MD  Active   Insulin  Pen Needle (BD  PEN NEEDLE NANO U/F) 32G X 4 MM MISC [id number]  Use with insulin  pen up to 2 times daily. [name], MD  Active   isosorbide  mononitrate (IMDUR ) 30 MG 24 hr tablet [id number]  Take 0.5 tablets (15 mg total) by mouth daily. Swinyer, Rosaline HERO, NP  Active   Lancets [facility] ULTRASOFT) lancets [id number]  Use to check blood sugar once daily [name], MD  Active Self  linagliptin (TRADJENTA) 5 MG TABS tablet [id number]  Take 5 mg by mouth daily. [provider]  Active Self  losartan  (COZAAR ) 50 MG tablet [id number]  TAKE 1 TAB BY MOUTH DAILY [name], MD  Active   meclizine  (ANTIVERT ) 25 MG tablet [id number]  TAKE 1 TABLET BY MOUTH 3 TIMES DAILY AS NEEDED FOR DIZZINESS. [name], MD  Active   ONE [facility] MISC [phone]  Test once day [name]  Active Self  Potassium Chloride  ER 20 MEQ TBCR [id number]  TAKE 1 TABLET BY MOUTH EVERY DAY [name], MD  Active   potassium chloride  SA (KLOR-CON  M) 20 MEQ tablet [id number]  Take 1 tablet (20 mEq total) by mouth daily for 3 days. [name], MD  Expired 08/19/24 2359   SYNJARDY XR 25-1000 MG TB24 [id number]  Take 1 tablet by mouth every morning. [provider]  Active   TRESIBA FLEXTOUCH 100 UNIT/ML FlexTouch Pen [id number]  Inject 10 Units into the skin daily. [provider]  Active               Assessment/Plan:   Diabetes: - Currently uncontrolled despite reported adherence to medications - Reviewed long term cardiovascular and renal outcomes of uncontrolled blood sugar - Reviewed goal A1c, goal fasting, and goal 2 hour post prandial glucose - Reviewed dietary modifications including low carb diet - Reminded of endo f/u. -Counseled on why finger prick readings can vary from CGM readings. Encouraged to place new sensor but always finger prick if questioning validity of readings - Patient denies personal or family history of multiple endocrine neoplasia type 2,  medullary thyroid  cancer; personal history of pancreatitis or gallbladder disease.    Follow Up Plan: 4 WEEKS Jon DEL [place], PharmD Clinical Pharmacist [phone]

## 2024-10-26 ENCOUNTER — Other Ambulatory Visit: Payer: Self-pay | Admitting: Family Medicine

## 2024-10-26 DIAGNOSIS — E1165 Type 2 diabetes mellitus with hyperglycemia: Secondary | ICD-10-CM

## 2024-10-26 DIAGNOSIS — E1149 Type 2 diabetes mellitus with other diabetic neurological complication: Secondary | ICD-10-CM

## 2024-10-27 NOTE — Progress Notes (Signed)
 Erroneous encounter

## 2024-11-05 ENCOUNTER — Other Ambulatory Visit: Payer: Self-pay | Admitting: Family Medicine

## 2024-11-05 DIAGNOSIS — R42 Dizziness and giddiness: Secondary | ICD-10-CM

## 2024-11-06 ENCOUNTER — Encounter: Payer: Self-pay | Admitting: Family Medicine

## 2024-11-06 ENCOUNTER — Ambulatory Visit

## 2024-11-06 ENCOUNTER — Ambulatory Visit (INDEPENDENT_AMBULATORY_CARE_PROVIDER_SITE_OTHER): Admitting: Family Medicine

## 2024-11-06 VITALS — BP 118/78 | HR 71 | Temp 98.1°F | Ht 70.0 in | Wt 231.2 lb

## 2024-11-06 DIAGNOSIS — R052 Subacute cough: Secondary | ICD-10-CM

## 2024-11-06 DIAGNOSIS — Z794 Long term (current) use of insulin: Secondary | ICD-10-CM | POA: Diagnosis not present

## 2024-11-06 DIAGNOSIS — E1140 Type 2 diabetes mellitus with diabetic neuropathy, unspecified: Secondary | ICD-10-CM

## 2024-11-06 DIAGNOSIS — J3489 Other specified disorders of nose and nasal sinuses: Secondary | ICD-10-CM | POA: Diagnosis not present

## 2024-11-06 MED ORDER — BENZONATATE 200 MG PO CAPS: 200.0000 mg | ORAL_CAPSULE | Freq: Two times a day (BID) | ORAL | 0 refills | Status: AC | PRN
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
## 2024-11-08 ENCOUNTER — Ambulatory Visit: Payer: Self-pay | Admitting: Family Medicine

## 2024-11-11 ENCOUNTER — Other Ambulatory Visit

## 2024-11-15 ENCOUNTER — Other Ambulatory Visit: Payer: Self-pay | Admitting: Family Medicine

## 2024-11-15 DIAGNOSIS — E876 Hypokalemia: Secondary | ICD-10-CM

## 2024-11-18 ENCOUNTER — Other Ambulatory Visit

## 2024-11-18 DIAGNOSIS — E1140 Type 2 diabetes mellitus with diabetic neuropathy, unspecified: Secondary | ICD-10-CM

## 2024-11-18 MED ORDER — FREESTYLE LIBRE 3 PLUS SENSOR MISC: 3 refills | Status: AC

## 2024-11-18 NOTE — Addendum Note (Signed)
 Addended by: [name] on: 11/18/2024 01:22 PM   Modules accepted: Orders

## 2024-11-26 ENCOUNTER — Ambulatory Visit: Admitting: Family Medicine

## 2024-12-09 ENCOUNTER — Other Ambulatory Visit
